# Patient Record
Sex: Female | Born: 1975 | Race: Black or African American | Hispanic: No | Marital: Single | Smoking: Current some day smoker
Health system: Southern US, Community
[De-identification: ages and names within clinical notes are randomized; demographics above are authoritative.]

## PROBLEM LIST (undated history)

## (undated) DIAGNOSIS — N049 Nephrotic syndrome with unspecified morphologic changes: Secondary | ICD-10-CM

## (undated) DIAGNOSIS — N289 Disorder of kidney and ureter, unspecified: Secondary | ICD-10-CM

## (undated) DIAGNOSIS — D649 Anemia, unspecified: Secondary | ICD-10-CM

## (undated) DIAGNOSIS — I10 Essential (primary) hypertension: Secondary | ICD-10-CM

## (undated) HISTORY — PX: RENAL BIOPSY: SHX156

## (undated) HISTORY — DX: Anemia, unspecified: D64.9

---

## 2015-10-14 ENCOUNTER — Encounter (HOSPITAL_COMMUNITY): Payer: Self-pay | Admitting: *Deleted

## 2015-10-14 ENCOUNTER — Inpatient Hospital Stay (HOSPITAL_COMMUNITY)
Admission: EM | Admit: 2015-10-14 | Discharge: 2015-10-25 | DRG: 683 | Disposition: A | Discharge status: Home / Self Care | Payer: Medicaid - Out of State | Attending: Internal Medicine | Admitting: Internal Medicine

## 2015-10-14 ENCOUNTER — Emergency Department (HOSPITAL_COMMUNITY): Payer: Medicaid - Out of State

## 2015-10-14 DIAGNOSIS — R609 Edema, unspecified: Secondary | ICD-10-CM | POA: Diagnosis present

## 2015-10-14 DIAGNOSIS — N051 Unspecified nephritic syndrome with focal and segmental glomerular lesions: Secondary | ICD-10-CM

## 2015-10-14 DIAGNOSIS — I129 Hypertensive chronic kidney disease with stage 1 through stage 4 chronic kidney disease, or unspecified chronic kidney disease: Principal | ICD-10-CM | POA: Diagnosis present

## 2015-10-14 DIAGNOSIS — E559 Vitamin D deficiency, unspecified: Secondary | ICD-10-CM | POA: Diagnosis present

## 2015-10-14 DIAGNOSIS — I10 Essential (primary) hypertension: Secondary | ICD-10-CM

## 2015-10-14 DIAGNOSIS — E876 Hypokalemia: Secondary | ICD-10-CM | POA: Diagnosis present

## 2015-10-14 DIAGNOSIS — D638 Anemia in other chronic diseases classified elsewhere: Secondary | ICD-10-CM | POA: Diagnosis present

## 2015-10-14 DIAGNOSIS — M722 Plantar fascial fibromatosis: Secondary | ICD-10-CM | POA: Diagnosis not present

## 2015-10-14 DIAGNOSIS — Z7952 Long term (current) use of systemic steroids: Secondary | ICD-10-CM

## 2015-10-14 DIAGNOSIS — N184 Chronic kidney disease, stage 4 (severe): Secondary | ICD-10-CM | POA: Diagnosis not present

## 2015-10-14 DIAGNOSIS — Z888 Allergy status to other drugs, medicaments and biological substances status: Secondary | ICD-10-CM

## 2015-10-14 DIAGNOSIS — N049 Nephrotic syndrome with unspecified morphologic changes: Secondary | ICD-10-CM

## 2015-10-14 DIAGNOSIS — M109 Gout, unspecified: Secondary | ICD-10-CM | POA: Diagnosis present

## 2015-10-14 DIAGNOSIS — F17200 Nicotine dependence, unspecified, uncomplicated: Secondary | ICD-10-CM | POA: Diagnosis present

## 2015-10-14 DIAGNOSIS — E1122 Type 2 diabetes mellitus with diabetic chronic kidney disease: Secondary | ICD-10-CM | POA: Diagnosis present

## 2015-10-14 DIAGNOSIS — M2550 Pain in unspecified joint: Secondary | ICD-10-CM | POA: Diagnosis present

## 2015-10-14 DIAGNOSIS — E872 Acidosis: Secondary | ICD-10-CM | POA: Diagnosis present

## 2015-10-14 DIAGNOSIS — Z79899 Other long term (current) drug therapy: Secondary | ICD-10-CM

## 2015-10-14 DIAGNOSIS — N189 Chronic kidney disease, unspecified: Secondary | ICD-10-CM | POA: Insufficient documentation

## 2015-10-14 DIAGNOSIS — I160 Hypertensive urgency: Secondary | ICD-10-CM | POA: Diagnosis present

## 2015-10-14 DIAGNOSIS — E8770 Fluid overload, unspecified: Secondary | ICD-10-CM | POA: Diagnosis present

## 2015-10-14 HISTORY — DX: Essential (primary) hypertension: I10

## 2015-10-14 HISTORY — DX: Disorder of kidney and ureter, unspecified: N28.9

## 2015-10-14 LAB — URINALYSIS, ROUTINE W REFLEX MICROSCOPIC
Bilirubin Urine: NEGATIVE
Glucose, UA: NEGATIVE mg/dL
Hgb urine dipstick: NEGATIVE
Ketones, ur: NEGATIVE mg/dL
LEUKOCYTES UA: NEGATIVE
NITRITE: NEGATIVE
SPECIFIC GRAVITY, URINE: 1.027 (ref 1.005–1.030)
pH: 5.5 (ref 5.0–8.0)

## 2015-10-14 LAB — BASIC METABOLIC PANEL
Anion gap: 10 (ref 5–15)
BUN: 70 mg/dL — AB (ref 6–20)
CHLORIDE: 118 mmol/L — AB (ref 101–111)
CO2: 16 mmol/L — AB (ref 22–32)
Calcium: 7.7 mg/dL — ABNORMAL LOW (ref 8.9–10.3)
Creatinine, Ser: 3.54 mg/dL — ABNORMAL HIGH (ref 0.44–1.00)
GFR calc Af Amer: 18 mL/min — ABNORMAL LOW (ref >60)
GFR, EST NON AFRICAN AMERICAN: 15 mL/min — AB (ref >60)
GLUCOSE: 90 mg/dL (ref 65–99)
POTASSIUM: 3.7 mmol/L (ref 3.5–5.1)
Sodium: 144 mmol/L (ref 135–145)

## 2015-10-14 LAB — BRAIN NATRIURETIC PEPTIDE: B Natriuretic Peptide: 201.7 pg/mL — ABNORMAL HIGH (ref 0.0–100.0)

## 2015-10-14 LAB — CBC WITH DIFFERENTIAL/PLATELET
Basophils Absolute: 0 10*3/uL (ref 0.0–0.1)
Basophils Relative: 0 %
EOS PCT: 1 %
Eosinophils Absolute: 0.1 10*3/uL (ref 0.0–0.7)
HEMATOCRIT: 31.1 % — AB (ref 36.0–46.0)
Hemoglobin: 9.9 g/dL — ABNORMAL LOW (ref 12.0–15.0)
LYMPHS ABS: 2.3 10*3/uL (ref 0.7–4.0)
LYMPHS PCT: 23 %
MCH: 29.5 pg (ref 26.0–34.0)
MCHC: 31.8 g/dL (ref 30.0–36.0)
MCV: 92.6 fL (ref 78.0–100.0)
Monocytes Absolute: 0.9 10*3/uL (ref 0.1–1.0)
Monocytes Relative: 9 %
Neutro Abs: 6.7 10*3/uL (ref 1.7–7.7)
Neutrophils Relative %: 67 %
PLATELETS: 361 10*3/uL (ref 150–400)
RBC: 3.36 MIL/uL — AB (ref 3.87–5.11)
RDW: 18.8 % — ABNORMAL HIGH (ref 11.5–15.5)
WBC: 10.1 10*3/uL (ref 4.0–10.5)

## 2015-10-14 LAB — HEPATIC FUNCTION PANEL
ALBUMIN: 1.7 g/dL — AB (ref 3.5–5.0)
ALT: 8 U/L — ABNORMAL LOW (ref 14–54)
AST: 14 U/L — AB (ref 15–41)
Alkaline Phosphatase: 79 U/L (ref 38–126)
Bilirubin, Direct: <0.1 mg/dL — ABNORMAL LOW (ref 0.1–0.5)
TOTAL PROTEIN: 5 g/dL — AB (ref 6.5–8.1)
Total Bilirubin: 0.7 mg/dL (ref 0.3–1.2)

## 2015-10-14 LAB — URINE MICROSCOPIC-ADD ON
RBC / HPF: NONE SEEN RBC/hpf (ref 0–5)
WBC UA: NONE SEEN WBC/hpf (ref 0–5)

## 2015-10-14 LAB — I-STAT BETA HCG BLOOD, ED (MC, WL, AP ONLY)

## 2015-10-14 LAB — I-STAT TROPONIN, ED: Troponin i, poc: 0.03 ng/mL (ref 0.00–0.08)

## 2015-10-14 MED ORDER — ALBUMIN HUMAN 25 % IV SOLN
25.0000 g | Freq: Three times a day (TID) | INTRAVENOUS | Status: DC
  Administered 2015-10-14 – 2015-10-16 (×7): 25 g via INTRAVENOUS
  Filled 2015-10-14 (×12): qty 100

## 2015-10-14 MED ORDER — ALBUMIN HUMAN 25 % IV SOLN
25.0000 g | Freq: Three times a day (TID) | INTRAVENOUS | Status: DC
  Filled 2015-10-14 (×2): qty 100

## 2015-10-14 MED ORDER — HYDRALAZINE HCL 50 MG PO TABS
50.0000 mg | ORAL_TABLET | Freq: Three times a day (TID) | ORAL | Status: DC
  Administered 2015-10-14 – 2015-10-17 (×8): 50 mg via ORAL
  Filled 2015-10-14 (×10): qty 1

## 2015-10-14 MED ORDER — ONDANSETRON HCL 4 MG/2ML IJ SOLN
4.0000 mg | Freq: Four times a day (QID) | INTRAMUSCULAR | Status: DC | PRN
  Administered 2015-10-15 – 2015-10-17 (×3): 4 mg via INTRAVENOUS
  Filled 2015-10-14 (×3): qty 2

## 2015-10-14 MED ORDER — ONDANSETRON HCL 4 MG PO TABS: 4.0000 mg | ORAL_TABLET | Freq: Four times a day (QID) | ORAL | Status: DC | PRN

## 2015-10-14 MED ORDER — FUROSEMIDE 10 MG/ML IJ SOLN
120.0000 mg | Freq: Three times a day (TID) | INTRAVENOUS | Status: DC
  Filled 2015-10-14 (×2): qty 12

## 2015-10-14 MED ORDER — PREDNISONE 10 MG PO TABS
10.0000 mg | ORAL_TABLET | Freq: Every day | ORAL | Status: DC
  Administered 2015-10-15 – 2015-10-16 (×2): 10 mg via ORAL
  Filled 2015-10-14 (×2): qty 1

## 2015-10-14 MED ORDER — HEPARIN SODIUM (PORCINE) 5000 UNIT/ML IJ SOLN
5000.0000 [IU] | Freq: Three times a day (TID) | INTRAMUSCULAR | Status: DC
  Administered 2015-10-14 – 2015-10-19 (×15): 5000 [IU] via SUBCUTANEOUS
  Filled 2015-10-14 (×15): qty 1

## 2015-10-14 MED ORDER — LABETALOL HCL 200 MG PO TABS
200.0000 mg | ORAL_TABLET | Freq: Two times a day (BID) | ORAL | Status: DC
  Administered 2015-10-14 – 2015-10-25 (×22): 200 mg via ORAL
  Filled 2015-10-14 (×23): qty 1

## 2015-10-14 MED ORDER — FUROSEMIDE 10 MG/ML IJ SOLN
120.0000 mg | Freq: Three times a day (TID) | INTRAVENOUS | Status: DC
  Administered 2015-10-14 – 2015-10-15 (×3): 120 mg via INTRAVENOUS
  Filled 2015-10-14 (×3): qty 12
  Filled 2015-10-14: qty 10
  Filled 2015-10-14 (×2): qty 12

## 2015-10-14 MED ORDER — SODIUM CHLORIDE 0.9 % IV SOLN: 250.0000 mL | INTRAVENOUS | Status: DC | PRN

## 2015-10-14 MED ORDER — SODIUM CHLORIDE 0.9 % IJ SOLN: 3.0000 mL | INTRAMUSCULAR | Status: DC | PRN

## 2015-10-14 MED ORDER — OXYCODONE HCL 5 MG PO TABS
5.0000 mg | ORAL_TABLET | ORAL | Status: DC | PRN
  Administered 2015-10-14 – 2015-10-21 (×21): 5 mg via ORAL
  Filled 2015-10-14 (×22): qty 1

## 2015-10-14 MED ORDER — SODIUM CHLORIDE 0.9 % IJ SOLN
3.0000 mL | Freq: Two times a day (BID) | INTRAMUSCULAR | Status: DC
  Administered 2015-10-14 – 2015-10-25 (×17): 3 mL via INTRAVENOUS

## 2015-10-14 MED ORDER — ACETAMINOPHEN 500 MG PO TABS
500.0000 mg | ORAL_TABLET | Freq: Four times a day (QID) | ORAL | Status: DC | PRN
  Administered 2015-10-15 – 2015-10-16 (×3): 500 mg via ORAL
  Filled 2015-10-14 (×3): qty 1

## 2015-10-14 MED ORDER — MORPHINE SULFATE (PF) 4 MG/ML IV SOLN
4.0000 mg | Freq: Once | INTRAVENOUS | Status: AC
  Administered 2015-10-14: 4 mg via INTRAVENOUS
  Filled 2015-10-14: qty 1

## 2015-10-14 NOTE — H&P (Signed)
PCP:   No primary care provider on file.   Chief Complaint:  Fatigue, worsening swelling  HPI: 40 year old female who has a history of FSGS diagnosed 2 years ago at [place], hypertension came to the hospital with worsening swelling throughout the body and fatigue. Patient recently moved to [place] from [place] 2 days ago she currently does not have a primary care physician. Patient is currently on Cytoxan treatment every month her last treatment was 3 weeks ago. Patient's last Cytoxan treatment is in February. Patient noticed that she has become more swollen than usual, causing shortness of breath on exertion. Though she denies chest pain no dizziness no palpitations. No nausea vomiting or diarrhea. No fever. No dysuria urgency frequency of urination. Though she does have increased frequency of bowel movement but denies diarrhea. Patient takes prednisone 10 g by mouth daily. Her blood pressure has been elevated over the past few days. She takes hydralazine 50 mg 3 times a day.  Allergies:   Allergies  Allergen Reactions  . Lisinopril Cough      Past Medical History  Diagnosis Date  . Renal disorder   . Hypertension     Past Surgical History  Procedure Laterality Date  . Cesarean section      Prior to Admission medications   Medication Sig Start Date End Date Taking? Authorizing Provider  acetaminophen (TYLENOL) 500 MG tablet Take 500 mg by mouth every 6 (six) hours as needed for moderate pain.   Yes Historical Provider, MD  hydrALAZINE (APRESOLINE) 50 MG tablet Take 50 mg by mouth 3 (three) times daily.   Yes Historical Provider, MD  predniSONE (DELTASONE) 10 MG tablet Take 10 mg by mouth daily with breakfast.   Yes Historical Provider, MD  torsemide (DEMADEX) 100 MG tablet Take 100 mg by mouth daily.   Yes Historical Provider, MD    Social History:  reports that she has been smoking.  She does not have any smokeless tobacco history on file. She reports that she  does not drink alcohol or use illicit drugs.  No family history of kidney disease  All the positives are listed in BOLD  Review of Systems:  HEENT: Headache, blurred vision, runny nose, sore throat Neck: Hypothyroidism, hyperthyroidism,,lymphadenopathy Chest : Shortness of breath, history of COPD, Asthma Heart : Chest pain, history of coronary arterey disease GI:  Nausea, vomiting, diarrhea, constipation, GERD GU: Dysuria, urgency, frequency of urination, hematuria Neuro: Stroke, seizures, syncope Psych: Depression, anxiety, hallucinations   Physical Exam: Blood pressure 176/118, pulse 74, temperature 98.2 F (36.8 C), temperature source Oral, resp. rate 16, SpO2 100 %. Constitutional:   Patient is a well-developed and well-nourished female* in no acute distress and cooperative with exam. Head: Normocephalic and atraumatic Mouth: Mucus membranes moist Eyes: PERRL, EOMI, left subconjunctival hemorrhage Neck: Supple, No Thyromegaly Cardiovascular: RRR, S1 normal, S2 normal Pulmonary/Chest: CTAB, no wheezes, rales, or rhonchi Abdominal: Soft. Non-tender, non-distended, bowel sounds are normal, no masses, organomegaly, or guarding present.  Neurological: A&O x3, Strength is normal and symmetric bilaterally, cranial nerve II-XII are grossly intact, no focal motor deficit, sensory intact to light touch bilaterally.  Extremities : 3+ pitting edema bilaterally lower extremities  Labs on Admission:  Basic Metabolic Panel:  Recent Labs Lab 10/14/15 1613  NA 144  K 3.7  CL 118*  CO2 16*  GLUCOSE 90  BUN 70*  CREATININE 3.54*  CALCIUM 7.7*   Liver Function Tests:  Recent Labs Lab 10/14/15 1604  AST 14*  ALT  8*  ALKPHOS 79  BILITOT 0.7  PROT 5.0*  ALBUMIN 1.7*   CBC:  Recent Labs Lab 10/14/15 1613  WBC 10.1  NEUTROABS 6.7  HGB 9.9*  HCT 31.1*  MCV 92.6  PLT 361    BNP (last 3 results)  Recent Labs  10/14/15 1614  BNP 201.7*     Radiological Exams on  Admission: Dg Chest 2 View  10/14/2015  CLINICAL DATA:  Nephrotic syndrome, fluid retention EXAM: CHEST  2 VIEW COMPARISON:  None. FINDINGS: The heart size and mediastinal contours are within normal limits. Both lungs are clear. The visualized skeletal structures are unremarkable. IMPRESSION: No active cardiopulmonary disease. Electronically Signed   By: Skipper Cliche M.D.   On: 10/14/2015 16:43    EKG: Independently reviewed. 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 Patient started on Lasix 120 mg IV every 8 hours. Torsemide unavailable in the hospital. We'll also start albumin 25 g 25% every 8 hours to help with diuresis. Called and discussed with nephrologist Dr. Jonnie Finner who will see the patient in a.m.  Hypertension urgency Patient blood pressure is not controlled with hydralazine Will continue with hydralazine 50 milligrams every 8 hours Start labetalol 200 mg every 12 hours  CKD stage IV Patient has FSGS, confirmed by biopsy in [place], [place]. Patient will need to follow up with nephrology as outpatient  DVT prophylaxis Heparin  Code status: Full code  Family discussion: No family present at bedside   Time Spent on Admission: 60 min  [facility] Pager: [phone] 10/14/2015, 7:27 PM  If 7PM-7AM, please contact night-coverage  [email or web address]  Password [email or web address]

## 2015-10-14 NOTE — ED Notes (Signed)
Per pt report: pt c/o of fluid retention all over her body.  Pt hx nephrolic syndrome and had cytoxin tx 3 weeks ago.  Pt reports SOB but is able to talk in complete sentences without difficulty and ambulate without difficulty. Pt reports pain in arms and legs.

## 2015-10-14 NOTE — ED Provider Notes (Signed)
CSN: [id number]     Arrival date & time 10/14/15  1316 History   First MD Initiated Contact with Patient 10/14/15 1538     Chief Complaint  Patient presents with  . Edema     (Consider location/radiation/quality/duration/timing/severity/associated sxs/prior Treatment) The history is provided by the patient and medical records.   40 y.o. F with hx of nephrotic syndrome, HTN, presenting to the ED for generalized edema. Patient states she has been having chronic issues with this over the past several months, however has become significantly worse over the past 2 weeks. Patient recently moved to [place] from [place] 2 days ago. She does not currently have a nephrologist nor a primary care physician. She did see her former nephrologist just before she moved and had Cytoxan treatment approximately 3 weeks ago. She states it is becoming very difficult for her to move around due to the swelling.  She also complains of some SOB.  Denies chest pain, palpitations, dizziness, or weakness.  Patient states her arms and legs have become increasingly painful.  She states her legs have also been "weeping" fluid.  She denies abdominal pain, nausea, vomiting, diarrhea.  No urinary symptoms.  States she has been taking her meds as normal, including her demadex and prednisone.  Past Medical History  Diagnosis Date  . Renal disorder   . Hypertension    Past Surgical History  Procedure Laterality Date  . Cesarean section     No family history on file. Social History  Substance Use Topics  . Smoking status: Current Some Day Smoker  . Smokeless tobacco: None  . Alcohol Use: No   OB History    No data available     Review of Systems  Constitutional:       Edema  All other systems reviewed and are negative.     Allergies  Lisinopril  Home Medications   Prior to Admission medications   Not on File   BP 185/122 mmHg  Pulse 85  Temp(Src) 98.2 F (36.8 C) (Oral)  Resp 18  SpO2 100%    Physical Exam  Constitutional: She is oriented to person, place, and time. She appears well-developed and well-nourished. No distress.  HENT:  Head: Normocephalic and atraumatic.  Mouth/Throat: Oropharynx is clear and moist.  Swelling/puffiness noted of face  Eyes: Conjunctivae and EOM are normal. Pupils are equal, round, and reactive to light.  Left subconjunctival hemorrhage noted, no eye pain, EOMs intact  Neck: Normal range of motion. Neck supple.  Cardiovascular: Normal rate, regular rhythm and normal heart sounds.   Pulmonary/Chest: Effort normal and breath sounds normal. No respiratory distress. She has no wheezes.  Abdominal: Soft. Bowel sounds are normal. There is no tenderness. There is no guarding.  Musculoskeletal: Normal range of motion.  3+ pitting edema of BLE extending up to thigh; no asymmetry or overlying skin changes, no weeping; no sores or open wounds  Neurological: She is alert and oriented to person, place, and time.  Skin: Skin is warm and dry. She is not diaphoretic.  Psychiatric: She has a normal mood and affect.  Nursing note and vitals reviewed.   ED Course  Procedures (including critical care time) Labs Review Labs Reviewed  CBC WITH DIFFERENTIAL/PLATELET - Abnormal; Notable for the following:    RBC 3.36 (*)    Hemoglobin 9.9 (*)    HCT 31.1 (*)    RDW 18.8 (*)    All other components within normal limits  BASIC METABOLIC PANEL - Abnormal;  Notable for the following:    Chloride 118 (*)    CO2 16 (*)    BUN 70 (*)    Creatinine, Ser 3.54 (*)    Calcium 7.7 (*)    GFR calc non Af Amer 15 (*)    GFR calc Af Amer 18 (*)    All other components within normal limits  BRAIN NATRIURETIC PEPTIDE - Abnormal; Notable for the following:    B Natriuretic Peptide 201.7 (*)    All other components within normal limits  URINALYSIS, ROUTINE W REFLEX MICROSCOPIC (NOT AT [facility]) - Abnormal; Notable for the following:    APPearance CLOUDY (*)    Protein, ur  >300 (*)    All other components within normal limits  HEPATIC FUNCTION PANEL - Abnormal; Notable for the following:    Total Protein 5.0 (*)    Albumin 1.7 (*)    AST 14 (*)    ALT 8 (*)    Bilirubin, Direct <0.1 (*)    All other components within normal limits  URINE MICROSCOPIC-ADD ON - Abnormal; Notable for the following:    Squamous Epithelial / LPF 6-30 (*)    Bacteria, UA FEW (*)    Casts HYALINE CASTS (*)    All other components within normal limits  I-STAT TROPOININ, ED  I-STAT BETA HCG BLOOD, ED ([facility], [facility], [facility] ONLY)    Imaging Review Dg Chest 2 View  10/14/2015  CLINICAL DATA:  Nephrotic syndrome, fluid retention EXAM: CHEST  2 VIEW COMPARISON:  None. FINDINGS: The heart size and mediastinal contours are within normal limits. Both lungs are clear. The visualized skeletal structures are unremarkable. IMPRESSION: No active cardiopulmonary disease. Electronically Signed   By: Skipper Cliche M.D.   On: 10/14/2015 16:43   I have personally reviewed and evaluated these images and lab results as part of my medical decision-making.   EKG Interpretation None      MDM   Final diagnoses:  Edema, unspecified type  Nephrotic syndrome   40 year old female here with generalized bodily fluid retention. His history of nephrotic syndrome, last had Cytoxan 3 weeks ago. She has recently relocated to [facility], does not currently have a primary care physician or a nephrologist in the area. States this is gotten worse over the past 2 weeks. Patient has swelling and puffiness noted to her face. She has 3+ pitting edema of bilateral lower extremities extending up to her thigh. There is no asymmetry or signs of cellulitis.  She reports SOB as well, denies chest pain.  No hx of CHF.  Currently on demadex 100mg  daily as well as prednisone 10mg  daily.  Will check labs, CXR.  Morphine given for pain.  5:37 PM Results discussed with patient-- SrCr today 3.54, GFR 15.  We have no records of her  prior kidney function for comparison of values, however she states her numbers were usually around 2.5 - 3.0.  Will discuss with nephrology for recommendations regarding treatment.  6:35 PM Case discussed with on call, Dr. Jonnie Finner-- recommends may be diuresed in the hospital.  Recommends 120mg  IV Lasix Q8H.  Monitor renal function.  Medicine may follow-up with him tomorrow if further recommendations needed.  Does not feel transfer to [place] necessary at this time.  Patient to follow-up with [facility] once discharged.  6:49 PM Patient admitted to medicine service, Dr. Darrick Meigs for diuresis.  Temp admit orders placed.  Larene Pickett, PA-C 10/14/15 [place], MD 10/14/15 2224

## 2015-10-15 DIAGNOSIS — N183 Chronic kidney disease, stage 3 (moderate): Secondary | ICD-10-CM | POA: Diagnosis not present

## 2015-10-15 DIAGNOSIS — I129 Hypertensive chronic kidney disease with stage 1 through stage 4 chronic kidney disease, or unspecified chronic kidney disease: Secondary | ICD-10-CM | POA: Diagnosis present

## 2015-10-15 DIAGNOSIS — F17200 Nicotine dependence, unspecified, uncomplicated: Secondary | ICD-10-CM | POA: Diagnosis present

## 2015-10-15 DIAGNOSIS — D638 Anemia in other chronic diseases classified elsewhere: Secondary | ICD-10-CM | POA: Diagnosis present

## 2015-10-15 DIAGNOSIS — E876 Hypokalemia: Secondary | ICD-10-CM | POA: Diagnosis present

## 2015-10-15 DIAGNOSIS — I10 Essential (primary) hypertension: Secondary | ICD-10-CM | POA: Diagnosis not present

## 2015-10-15 DIAGNOSIS — N179 Acute kidney failure, unspecified: Secondary | ICD-10-CM | POA: Diagnosis not present

## 2015-10-15 DIAGNOSIS — M109 Gout, unspecified: Secondary | ICD-10-CM | POA: Diagnosis present

## 2015-10-15 DIAGNOSIS — Z79899 Other long term (current) drug therapy: Secondary | ICD-10-CM | POA: Diagnosis not present

## 2015-10-15 DIAGNOSIS — N184 Chronic kidney disease, stage 4 (severe): Secondary | ICD-10-CM | POA: Diagnosis present

## 2015-10-15 DIAGNOSIS — Z7952 Long term (current) use of systemic steroids: Secondary | ICD-10-CM | POA: Diagnosis not present

## 2015-10-15 DIAGNOSIS — R5383 Other fatigue: Secondary | ICD-10-CM | POA: Diagnosis present

## 2015-10-15 DIAGNOSIS — I160 Hypertensive urgency: Secondary | ICD-10-CM | POA: Diagnosis present

## 2015-10-15 DIAGNOSIS — Z888 Allergy status to other drugs, medicaments and biological substances status: Secondary | ICD-10-CM | POA: Diagnosis not present

## 2015-10-15 DIAGNOSIS — M722 Plantar fascial fibromatosis: Secondary | ICD-10-CM | POA: Diagnosis present

## 2015-10-15 DIAGNOSIS — N049 Nephrotic syndrome with unspecified morphologic changes: Secondary | ICD-10-CM | POA: Diagnosis not present

## 2015-10-15 DIAGNOSIS — E872 Acidosis: Secondary | ICD-10-CM | POA: Diagnosis present

## 2015-10-15 DIAGNOSIS — E8770 Fluid overload, unspecified: Secondary | ICD-10-CM | POA: Diagnosis present

## 2015-10-15 DIAGNOSIS — E559 Vitamin D deficiency, unspecified: Secondary | ICD-10-CM | POA: Diagnosis present

## 2015-10-15 DIAGNOSIS — E1122 Type 2 diabetes mellitus with diabetic chronic kidney disease: Secondary | ICD-10-CM | POA: Diagnosis present

## 2015-10-15 DIAGNOSIS — M2550 Pain in unspecified joint: Secondary | ICD-10-CM | POA: Diagnosis present

## 2015-10-15 LAB — CBC
HEMATOCRIT: 26 % — AB (ref 36.0–46.0)
HEMOGLOBIN: 8.2 g/dL — AB (ref 12.0–15.0)
MCH: 29.5 pg (ref 26.0–34.0)
MCHC: 31.5 g/dL (ref 30.0–36.0)
MCV: 93.5 fL (ref 78.0–100.0)
Platelets: 237 10*3/uL (ref 150–400)
RBC: 2.78 MIL/uL — ABNORMAL LOW (ref 3.87–5.11)
RDW: 18.9 % — AB (ref 11.5–15.5)
WBC: 6.9 10*3/uL (ref 4.0–10.5)

## 2015-10-15 LAB — COMPREHENSIVE METABOLIC PANEL
ALBUMIN: 1.5 g/dL — AB (ref 3.5–5.0)
ALK PHOS: 77 U/L (ref 38–126)
ALT: 12 U/L — ABNORMAL LOW (ref 14–54)
ANION GAP: 9 (ref 5–15)
AST: 14 U/L — ABNORMAL LOW (ref 15–41)
BILIRUBIN TOTAL: 0.3 mg/dL (ref 0.3–1.2)
BUN: 74 mg/dL — ABNORMAL HIGH (ref 6–20)
CALCIUM: 7.6 mg/dL — AB (ref 8.9–10.3)
CO2: 17 mmol/L — ABNORMAL LOW (ref 22–32)
Chloride: 118 mmol/L — ABNORMAL HIGH (ref 101–111)
Creatinine, Ser: 3.51 mg/dL — ABNORMAL HIGH (ref 0.44–1.00)
GFR calc non Af Amer: 16 mL/min — ABNORMAL LOW (ref >60)
GFR, EST AFRICAN AMERICAN: 18 mL/min — AB (ref >60)
GLUCOSE: 86 mg/dL (ref 65–99)
POTASSIUM: 3.8 mmol/L (ref 3.5–5.1)
Sodium: 144 mmol/L (ref 135–145)
TOTAL PROTEIN: 4.2 g/dL — AB (ref 6.5–8.1)

## 2015-10-15 MED ORDER — DEXTROSE 5 % IV SOLN
18.5000 mL/h | INTRAVENOUS | Status: DC
  Administered 2015-10-15 – 2015-10-17 (×3): 18.5 mL/h via INTRAVENOUS
  Filled 2015-10-15 (×7): qty 250

## 2015-10-15 NOTE — Progress Notes (Signed)
[facility] PROGRESS NOTE  Sandy [name] [id number] DOB: [birth year] DOA: 10/14/2015 PCP: No primary care provider on file.  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 Patient was started on Lasix 120 mg IV every 8 hours.  Started patient on albumin 25 g 25% every 8 hours to help with diuresis. Dr. Jonnie Finner consulted for further recs  Hypertension urgency Blood pressures difficult to control with hydralazine Continued with hydralazine 50 milligrams every 8 hours Started on labetalol 200 mg every 12 hours BP seems improved  CKD stage IV Patient has FSGS, confirmed by biopsy in [place], [place]. Patient will need close Nephrology follow up as outpatient  DVT prophylaxis Heparin subq while admitted  Code Status: Full Family Communication: Pt in room Disposition Plan: Anticipate d/c when renal function improves and when cleared by Nephrology  Consultants:  nephrology  Procedures:    Antibiotics: Anti-infectives    None      HPI/Subjective: Denies sob or chest pain  Objective: Filed Vitals:   10/15/15 0640 10/15/15 0747 10/15/15 0815 10/15/15 1343  BP: 152/102 156/118 160/100 132/94  Pulse: 87 87 87 85  Temp: 98.2 F (36.8 C) 98 F (36.7 C) 98 F (36.7 C) 98.2 F (36.8 C)  TempSrc: Oral Oral Oral Oral  Resp: 16   16  Height:      Weight:      SpO2: 100% 100% 100% 99%    Intake/Output Summary (Last 24 hours) at 10/15/15 1407 Last data filed at 10/15/15 1225  Gross per 24 hour  Intake    564 ml  Output    500 ml  Net     64 ml   Filed Weights   10/14/15 1954  Weight: 113.172 kg (249 lb 8 oz)    Exam:   General:  Awake, in nad  Cardiovascular: regular, s1, s2  Respiratory: normal resp  effort, no wheezing  Abdomen: soft,nondistended  Musculoskeletal: perfused, 2-3+ pitting LE edema   Data Reviewed: Basic Metabolic Panel:  Recent Labs Lab 10/14/15 1613 10/15/15 0551  NA 144 144  K 3.7 3.8  CL 118* 118*  CO2 16* 17*  GLUCOSE 90 86  BUN 70* 74*  CREATININE 3.54* 3.51*  CALCIUM 7.7* 7.6*   Liver Function Tests:  Recent Labs Lab 10/14/15 1604 10/15/15 0551  AST 14* 14*  ALT 8* 12*  ALKPHOS 79 77  BILITOT 0.7 0.3  PROT 5.0* 4.2*  ALBUMIN 1.7* 1.5*   No results for input(s): LIPASE, AMYLASE in the last 168 hours. No results for input(s): AMMONIA in the last 168 hours. CBC:  Recent Labs Lab 10/14/15 1613 10/15/15 0551  WBC 10.1 6.9  NEUTROABS 6.7  --   HGB 9.9* 8.2*  HCT 31.1* 26.0*  MCV 92.6 93.5  PLT 361 237   Cardiac Enzymes: No results for input(s): CKTOTAL, CKMB, CKMBINDEX, TROPONINI in the last 168 hours. BNP (last 3 results)  Recent Labs  10/14/15 1614  BNP 201.7*    ProBNP (last 3 results) No results for input(s): PROBNP in the last 8760 hours.  CBG: No results for input(s): GLUCAP in the last 168 hours.  No results found for this or any previous visit (from the past 240 hour(s)).   Studies: Dg Chest 2 View  10/14/2015  CLINICAL DATA:  Nephrotic  syndrome, fluid retention EXAM: CHEST  2 VIEW COMPARISON:  None. FINDINGS: The heart size and mediastinal contours are within normal limits. Both lungs are clear. The visualized skeletal structures are unremarkable. IMPRESSION: No active cardiopulmonary disease. Electronically Signed   By: Skipper Cliche M.D.   On: 10/14/2015 16:43    Scheduled Meds: . albumin human  25 g Intravenous 3 times per day  . furosemide  120 mg Intravenous 3 times per day  . heparin  5,000 Units Subcutaneous 3 times per day  . hydrALAZINE  50 mg Oral TID  . labetalol  200 mg Oral BID  . predniSONE  10 mg Oral Q breakfast  . sodium chloride  3 mL Intravenous Q12H   Continuous Infusions:   Active  Problems:   Edema   Anasarca associated with disorder of kidney   CKD (chronic kidney disease), stage IV ([place])   Hypertension   Sandy [name], [place] [facility] Pager [phone]. If 7PM-7AM, please contact night-coverage at [email or web address], password [facility] 10/15/2015, 2:07 PM

## 2015-10-15 NOTE — Consult Note (Signed)
Renal Service Consult Note [facility]  Millenia Waldvogel 10/15/2015 Anza D Requesting Physician:  Dr Wyline Copas  Reason for Consult:  Patient w neph syndrome/ FSGS w leg swelling, severe edema HPI: The patient is a 40 y.o. year-old with hx of FSGS diagnosed by renal bx in [place], [place] July 2015.  Was treated with oral steroids w good response (history is all per patient), so after several months steroids were tapered and CyA was started.  She developed numerous side effects from CyA including renal dysfunction, hair growth, nausea.  She went to a new doctor at [facility] in [place] where she was put back on po steroids (pred 60/d) and started on IV cytoxan in June 2016, to get 6 courses.  She has rec'd 5 of the 6 monthly doses of Cytoxan.  Her prednisone has been tapered down to 10 mg about 1 month ago with the last dose 20 mg and 2 wks prior it was 30 mg , 2 wks prior 40 mg, etc.  She has developed severe edema of the legs and hips over the last 2 weeks. She just moved to [facility] to live with her sister and presented to ED yesterday due to the swelling. Her last Cytoxan dose is due in February.    She says she doesn't respond well to lasix.  She says her worse creat was 3.5 , but lately it has been better in the 2's.    She denies any nsaid use, drug use, SOB or CP. No cough, abd pain, dysuria, hematuria.  No joint pains.   Past Medical History  Past Medical History  Diagnosis Date  . Renal disorder   . Hypertension    Past Surgical History  Past Surgical History  Procedure Laterality Date  . Cesarean section     Family History No family history on file. Social History  reports that she has been smoking.  She does not have any smokeless tobacco history on file. She reports that she does not drink alcohol or use illicit drugs. Allergies  Allergies  Allergen Reactions  . Lisinopril Cough   Home medications Prior to Admission medications   Medication Sig Start Date End  Date Taking? Authorizing Provider  acetaminophen (TYLENOL) 500 MG tablet Take 500 mg by mouth every 6 (six) hours as needed for moderate pain.   Yes Historical Provider, MD  hydrALAZINE (APRESOLINE) 50 MG tablet Take 50 mg by mouth 3 (three) times daily.   Yes Historical Provider, MD  predniSONE (DELTASONE) 10 MG tablet Take 10 mg by mouth daily with breakfast.   Yes Historical Provider, MD  torsemide (DEMADEX) 100 MG tablet Take 100 mg by mouth daily.   Yes Historical Provider, MD   Liver Function Tests  Recent Labs Lab 10/14/15 1604 10/15/15 0551  AST 14* 14*  ALT 8* 12*  ALKPHOS 79 77  BILITOT 0.7 0.3  PROT 5.0* 4.2*  ALBUMIN 1.7* 1.5*   No results for input(s): LIPASE, AMYLASE in the last 168 hours. CBC  Recent Labs Lab 10/14/15 1613 10/15/15 0551  WBC 10.1 6.9  NEUTROABS 6.7  --   HGB 9.9* 8.2*  HCT 31.1* 26.0*  MCV 92.6 93.5  PLT 361 485   Basic Metabolic Panel  Recent Labs Lab 10/14/15 1613 10/15/15 0551  NA 144 144  K 3.7 3.8  CL 118* 118*  CO2 16* 17*  GLUCOSE 90 86  BUN 70* 74*  CREATININE 3.54* 3.51*  CALCIUM 7.7* 7.6*    Filed Vitals:  10/15/15 0815 10/15/15 1343 10/15/15 1428 10/15/15 1507  BP: 160/100 132/94 141/92 150/88  Pulse: 87 85 87 87  Temp: 98 F (36.7 C) 98.2 F (36.8 C) 98.2 F (36.8 C) 98.4 F (36.9 C)  TempSrc: Oral Oral Oral Oral  Resp:  16    Height:      Weight:      SpO2: 100% 99% 99% 100%   Exam Obese pleasant WF no distress No rash, cyanosis or gangrene Sclera anicteric, throat clear JVP 12 cm Chest occ crackles , mostly clear bilat RRR no mrg Abd obese soft ntnd striae no hsm no ascites GU deferred MS no joint effusion/ deformity Ext 3+ bilat LE edema from feet to hips Neuro is alert, Ox 3 appropriate  Na 144 K 3.8  CO2 17  BUN 74  Creat 3.51  CA 7.6 eGFR 16 ml/min  Alb 1.5   WBC 6.9  Hb 8.2  plt 237  Glu 86 CXR no acute UA >300 prot, no rbc/ wbc  Assessment: 1 Nephrotic syndrome - recurrent issue, hx  of FSGS on steroid taper and sp 5/6 IV monthly cytoxan doses. She says lasix doesn't work for diuresis.  2 HTN on hydralazine, labetalol added here 3 Renal failure, chronic +/- acute; stage 4 4 Anasarca - due to #1, no signs of heart failure   Plan - IV Bumex drip. IV albumin. Will follow.   Kelly Splinter MD [employer] pager [phone]    cell [phone] 10/15/2015, 7:53 PM

## 2015-10-16 LAB — CBC
HCT: 23.1 % — ABNORMAL LOW (ref 36.0–46.0)
HEMOGLOBIN: 7.4 g/dL — AB (ref 12.0–15.0)
MCH: 29.8 pg (ref 26.0–34.0)
MCHC: 32 g/dL (ref 30.0–36.0)
MCV: 93.1 fL (ref 78.0–100.0)
PLATELETS: 206 10*3/uL (ref 150–400)
RBC: 2.48 MIL/uL — AB (ref 3.87–5.11)
RDW: 18.8 % — ABNORMAL HIGH (ref 11.5–15.5)
WBC: 6.1 10*3/uL (ref 4.0–10.5)

## 2015-10-16 LAB — BASIC METABOLIC PANEL
Anion gap: 12 (ref 5–15)
BUN: 65 mg/dL — ABNORMAL HIGH (ref 6–20)
CALCIUM: 7.6 mg/dL — AB (ref 8.9–10.3)
CO2: 15 mmol/L — AB (ref 22–32)
CREATININE: 3.16 mg/dL — AB (ref 0.44–1.00)
Chloride: 114 mmol/L — ABNORMAL HIGH (ref 101–111)
GFR, EST AFRICAN AMERICAN: 20 mL/min — AB (ref >60)
GFR, EST NON AFRICAN AMERICAN: 18 mL/min — AB (ref >60)
Glucose, Bld: 87 mg/dL (ref 65–99)
Potassium: 3.4 mmol/L — ABNORMAL LOW (ref 3.5–5.1)
Sodium: 141 mmol/L (ref 135–145)

## 2015-10-16 LAB — PROTEIN / CREATININE RATIO, URINE
CREATININE, URINE: 64.82 mg/dL
Protein Creatinine Ratio: 21.04 mg/mg{Cre} — ABNORMAL HIGH (ref 0.00–0.15)
TOTAL PROTEIN, URINE: 1364 mg/dL

## 2015-10-16 MED ORDER — SODIUM BICARBONATE 650 MG PO TABS
650.0000 mg | ORAL_TABLET | Freq: Two times a day (BID) | ORAL | Status: DC
  Administered 2015-10-16 – 2015-10-25 (×18): 650 mg via ORAL
  Filled 2015-10-16 (×20): qty 1

## 2015-10-16 MED ORDER — PREDNISONE 50 MG PO TABS
60.0000 mg | ORAL_TABLET | Freq: Every day | ORAL | Status: DC
  Administered 2015-10-16: 50 mg via ORAL
  Administered 2015-10-17 – 2015-10-19 (×3): 60 mg via ORAL
  Filled 2015-10-16 (×4): qty 1

## 2015-10-16 NOTE — Care Management Note (Signed)
Case Management Note  Patient Details  Name: Sandy [name] MRN: [id number] Date of Birth: August 24, 1976  Subjective/Objective:    40 y/o f admitted w/Anasarca. Hx[place]                Action/Plan:d/c plan home.   Expected Discharge Date:                  Expected Discharge Plan:  Home/Self Care  In-House Referral:     Discharge planning Services  CM Consult  Post Acute Care Choice:    Choice offered to:     DME Arranged:    DME Agency:     HH Arranged:    HH Agency:     Status of Service:  In process, will continue to follow  Medicare Important Message Given:    Date Medicare IM Given:    Medicare IM give by:    Date Additional Medicare IM Given:    Additional Medicare Important Message give by:     If discussed at [place] Meetings, dates discussed:    Additional Comments:  Dessa Phi, RN 10/16/2015, 2:07 PM

## 2015-10-16 NOTE — Progress Notes (Signed)
  [facility] Progress Note   Subjective: having new pain in all the joints of her hands, feet, legs. Low grade temp y esterdya. No cough, SOB or chills.   Filed Vitals:   10/15/15 1428 10/15/15 1507 10/15/15 2100 10/16/15 0521  BP: 141/92 150/88 152/92 156/96  Pulse: 87 87 92 94  Temp: 98.2 F (36.8 C) 98.4 F (36.9 C) 99.7 F (37.6 C) 99 F (37.2 C)  TempSrc: Oral Oral Oral Oral  Resp:   16 18  Height:      Weight:      SpO2: 99% 100% 100% 100%    Inpatient medications: . albumin human  25 g Intravenous 3 times per day  . heparin  5,000 Units Subcutaneous 3 times per day  . hydrALAZINE  50 mg Oral TID  . labetalol  200 mg Oral BID  . predniSONE  60 mg Oral Q breakfast  . sodium bicarbonate  650 mg Oral BID  . sodium chloride  3 mL Intravenous Q12H   . dextrose 5 % 250 mL with bumetanide (BUMEX) 7.5 mg infusion 18.5 mL/hr (10/16/15 1147)   sodium chloride, acetaminophen, ondansetron **OR** ondansetron (ZOFRAN) IV, oxyCODONE, sodium chloride  Exam: Obese pleasant young adult WF no distress JVP 12 cm Chest occ crackles , mostly clear bilat RRR no mrg Abd obese soft ntnd striae no hsm no ascites MS no joint effusion/ deformity Ext 3+ bilat LE edema from feet to hips Neuro is alert, Ox 3 appropriate  Na 144 K 3.8 CO2 17 BUN 74 Creat 3.51 CA 7.6 eGFR 16 ml/min Alb 1.5 WBC 6.9 Hb 8.2 plt 237 Glu 86 CXR no acute UA >300 prot, no rbc/ wbc  Assessment: 1 Nephrotic syndrome - failed CyA in 2015 and now is failing her IV Cytoxan course (started in [place] 5-6 mos ago). Not a good prognosis. There may be some other Rx's that can be tried but with worsening renal function these are less and less likely to be successful.  I have d/w patient and let her know that there is not a good answer to this problem.  For now will raise pred back to 60/ d and focus on diuresis. Will defer any new immunotherapy to OP setting.  2 HTN on hydralazine, labetalol added  here 3 Renal failure, chronic +/- acute; stage 4 4 Met acidosis start po bicarb 5 Arthralgias - not sure what's causing this  Plan - as above   Kelly Splinter MD [facility] pager [phone]    cell [phone] 10/16/2015, 3:06 PM    Recent Labs Lab 10/14/15 1613 10/15/15 0551 10/16/15 0531  NA 144 144 141  K 3.7 3.8 3.4*  CL 118* 118* 114*  CO2 16* 17* 15*  GLUCOSE 90 86 87  BUN 70* 74* 65*  CREATININE 3.54* 3.51* 3.16*  CALCIUM 7.7* 7.6* 7.6*    Recent Labs Lab 10/14/15 1604 10/15/15 0551  AST 14* 14*  ALT 8* 12*  ALKPHOS 79 77  BILITOT 0.7 0.3  PROT 5.0* 4.2*  ALBUMIN 1.7* 1.5*    Recent Labs Lab 10/14/15 1613 10/15/15 0551 10/16/15 0531  WBC 10.1 6.9 6.1  NEUTROABS 6.7  --   --   HGB 9.9* 8.2* 7.4*  HCT 31.1* 26.0* 23.1*  MCV 92.6 93.5 93.1  PLT 361 237 206

## 2015-10-16 NOTE — Progress Notes (Signed)
[facility] PROGRESS NOTE  Alundra Petrecca [id number] DOB: 1975/10/04 DOA: 10/14/2015 PCP: No primary care provider on file.  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 Patient was initially started on Lasix 120 mg IV every 8 hours, now on bumex gtt Started patient on albumin 25 g 25% every 8 hours to help with diuresis. Dr. Jonnie Finner consulted and following. 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 Patient will eventually need close Nephrology follow up as outpatient  DVT prophylaxis Heparin subq while admitted  Code Status: Full Family Communication: Pt in room Disposition Plan: Anticipate d/c when renal function improves and when cleared by Nephrology  Consultants:  nephrology  Procedures:    Antibiotics: Anti-infectives    None      HPI/Subjective: Reports still having decreased urine output despite bumex gtt  Objective: Filed Vitals:   10/15/15 1428 10/15/15 1507 10/15/15 2100 10/16/15 0521  BP: 141/92 150/88 152/92 156/96  Pulse: 87 87 92 94  Temp: 98.2 F (36.8 C) 98.4 F (36.9 C) 99.7 F (37.6 C) 99 F (37.2 C)  TempSrc: Oral Oral Oral Oral  Resp:   16 18  Height:      Weight:      SpO2: 99% 100% 100% 100%    Intake/Output Summary (Last 24 hours) at 10/16/15 1329 Last data filed at 10/16/15 0820  Gross per 24 hour  Intake 1244.8 ml  Output   2100 ml  Net -855.2 ml   Filed Weights   10/14/15 1954  Weight: 113.172 kg (249  lb 8 oz)    Exam:   General:  Awake, in nad  Cardiovascular: regular, s1, s2  Respiratory: normal resp effort, no wheezing  Abdomen: soft,nondistended  Musculoskeletal: perfused, 2+ pitting LE edema bilaterally  Data Reviewed: Basic Metabolic Panel:  Recent Labs Lab 10/14/15 1613 10/15/15 0551 10/16/15 0531  NA 144 144 141  K 3.7 3.8 3.4*  CL 118* 118* 114*  CO2 16* 17* 15*  GLUCOSE 90 86 87  BUN 70* 74* 65*  CREATININE 3.54* 3.51* 3.16*  CALCIUM 7.7* 7.6* 7.6*   Liver Function Tests:  Recent Labs Lab 10/14/15 1604 10/15/15 0551  AST 14* 14*  ALT 8* 12*  ALKPHOS 79 77  BILITOT 0.7 0.3  PROT 5.0* 4.2*  ALBUMIN 1.7* 1.5*   No results for input(s): LIPASE, AMYLASE in the last 168 hours. No results for input(s): AMMONIA in the last 168 hours. CBC:  Recent Labs Lab 10/14/15 1613 10/15/15 0551 10/16/15 0531  WBC 10.1 6.9 6.1  NEUTROABS 6.7  --   --   HGB 9.9* 8.2* 7.4*  HCT 31.1* 26.0* 23.1*  MCV 92.6 93.5 93.1  PLT 361 237 206   Cardiac Enzymes: No results for input(s): CKTOTAL, CKMB, CKMBINDEX, TROPONINI in the last 168 hours. BNP (last 3 results)  Recent Labs  10/14/15 1614  BNP 201.7*    ProBNP (last 3 results) No results for input(s): PROBNP in the last 8760 hours.  CBG: No results for input(s): GLUCAP in the last 168 hours.  No results  found for this or any previous visit (from the past 240 hour(s)).   Studies: Dg Chest 2 View  10/14/2015  CLINICAL DATA:  Nephrotic syndrome, fluid retention EXAM: CHEST  2 VIEW COMPARISON:  None. FINDINGS: The heart size and mediastinal contours are within normal limits. Both lungs are clear. The visualized skeletal structures are unremarkable. IMPRESSION: No active cardiopulmonary disease. Electronically Signed   By: Skipper Cliche M.D.   On: 10/14/2015 16:43    Scheduled Meds: . albumin human  25 g Intravenous 3 times per day  . heparin  5,000 Units Subcutaneous 3 times per day  . hydrALAZINE   50 mg Oral TID  . labetalol  200 mg Oral BID  . predniSONE  10 mg Oral Q breakfast  . sodium chloride  3 mL Intravenous Q12H   Continuous Infusions: . dextrose 5 % 250 mL with bumetanide (BUMEX) 7.5 mg infusion 18.5 mL/hr (10/16/15 1147)    Active Problems:   Edema   Anasarca associated with disorder of kidney   CKD (chronic kidney disease), stage IV ([place])   Hypertension   [facility], [facility] Pager [phone]. If 7PM-7AM, please contact night-coverage at [email or web address], password [facility] 10/16/2015, 1:29 PM  LOS: 1 day

## 2015-10-17 LAB — BASIC METABOLIC PANEL
Anion gap: 9 (ref 5–15)
BUN: 57 mg/dL — AB (ref 6–20)
CHLORIDE: 114 mmol/L — AB (ref 101–111)
CO2: 20 mmol/L — AB (ref 22–32)
CREATININE: 2.97 mg/dL — AB (ref 0.44–1.00)
Calcium: 8 mg/dL — ABNORMAL LOW (ref 8.9–10.3)
GFR calc Af Amer: 22 mL/min — ABNORMAL LOW (ref >60)
GFR calc non Af Amer: 19 mL/min — ABNORMAL LOW (ref >60)
Glucose, Bld: 111 mg/dL — ABNORMAL HIGH (ref 65–99)
POTASSIUM: 3.4 mmol/L — AB (ref 3.5–5.1)
Sodium: 143 mmol/L (ref 135–145)

## 2015-10-17 LAB — VITAMIN D 25 HYDROXY (VIT D DEFICIENCY, FRACTURES)

## 2015-10-17 MED ORDER — HYDRALAZINE HCL 50 MG PO TABS
75.0000 mg | ORAL_TABLET | Freq: Three times a day (TID) | ORAL | Status: DC
  Filled 2015-10-17 (×2): qty 1

## 2015-10-17 MED ORDER — TORSEMIDE 100 MG PO TABS
100.0000 mg | ORAL_TABLET | Freq: Two times a day (BID) | ORAL | Status: DC
  Administered 2015-10-17 – 2015-10-22 (×11): 100 mg via ORAL
  Filled 2015-10-17 (×12): qty 1

## 2015-10-17 MED ORDER — HYDRALAZINE HCL 50 MG PO TABS
50.0000 mg | ORAL_TABLET | Freq: Three times a day (TID) | ORAL | Status: DC
  Administered 2015-10-17 – 2015-10-21 (×11): 50 mg via ORAL
  Filled 2015-10-17 (×12): qty 1

## 2015-10-17 MED ORDER — FUROSEMIDE 10 MG/ML IJ SOLN: 160.0000 mg | Freq: Four times a day (QID) | INTRAVENOUS | Status: DC

## 2015-10-17 MED ORDER — LOSARTAN POTASSIUM 50 MG PO TABS
50.0000 mg | ORAL_TABLET | Freq: Every day | ORAL | Status: DC
  Administered 2015-10-17 – 2015-10-19 (×3): 50 mg via ORAL
  Filled 2015-10-17 (×3): qty 1

## 2015-10-17 MED ORDER — METOLAZONE 5 MG PO TABS
5.0000 mg | ORAL_TABLET | Freq: Two times a day (BID) | ORAL | Status: DC
  Administered 2015-10-17 – 2015-10-19 (×4): 5 mg via ORAL
  Filled 2015-10-17 (×5): qty 1

## 2015-10-17 NOTE — Progress Notes (Signed)
  [facility] Progress Note   Subjective: 2.3 L off yesterday  Filed Vitals:   10/16/15 2215 10/16/15 2345 10/17/15 0432 10/17/15 1418  BP: 169/117 176/104 157/104 169/102  Pulse:   84 78  Temp:   97.8 F (36.6 C) 98.2 F (36.8 C)  TempSrc:   Oral Oral  Resp:   18 16  Height:      Weight:   114.397 kg (252 lb 3.2 oz)   SpO2:   100% 99%    Inpatient medications: . heparin  5,000 Units Subcutaneous 3 times per day  . hydrALAZINE  50 mg Oral TID  . labetalol  200 mg Oral BID  . losartan  50 mg Oral Daily  . metolazone  5 mg Oral BID  . predniSONE  60 mg Oral Q breakfast  . sodium bicarbonate  650 mg Oral BID  . sodium chloride  3 mL Intravenous Q12H  . torsemide  100 mg Oral BID     sodium chloride, acetaminophen, ondansetron **OR** ondansetron (ZOFRAN) IV, oxyCODONE, sodium chloride  Exam: Obese pleasant young adult WF no distress JVP 12 cm Chest occ crackles , mostly clear bilat RRR no mrg Abd obese soft ntnd striae no hsm no ascites MS no joint effusion/ deformity Ext 3+ bilat LE edema from feet to hips Neuro is alert, Ox 3 appropriate  Na 144 K 3.8 CO2 17 BUN 74 Creat 3.51 CA 7.6 eGFR 16 ml/min Alb 1.5 WBC 6.9 Hb 8.2 plt 237 Glu 86 CXR no acute UA >300 prot, no rbc/ wbc  Assessment: 1 Nephrotic syndrome - failed CyA in 2015 and then had Cellcept. Was getting course of monthly IV Cytoxan and "doing very good" with that according to patient but missed one dose due to gout flare and then went into relapse with 60 lb wt gain over last few weeks. 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  2 HTN on hydralazine, labetalol added here 3 Renal failure, chronic +/- acute; stage 4 4 Met acidosis start po bicarb 5 Arthralgias - not sure what's causing this  Plan - marginal diuresis w IV bumex, will switch to high-dose po torsemide + zaroxlyn which has worked for  her in the past.  Adding losartan for proteinuria reduction. She had cough reaction to ACEi in the past. Will consider renal biopsy after diuresis accomplished to establish new baseline. Will try to contact her MD in [place] regarding the Cytoxan course.    Kelly Splinter MD [facility] pager [phone]    cell [phone] 10/17/2015, 5:11 PM    Recent Labs Lab 10/15/15 0551 10/16/15 0531 10/17/15 0508  NA 144 141 143  K 3.8 3.4* 3.4*  CL 118* 114* 114*  CO2 17* 15* 20*  GLUCOSE 86 87 111*  BUN 74* 65* 57*  CREATININE 3.51* 3.16* 2.97*  CALCIUM 7.6* 7.6* 8.0*    Recent Labs Lab 10/14/15 1604 10/15/15 0551  AST 14* 14*  ALT 8* 12*  ALKPHOS 79 77  BILITOT 0.7 0.3  PROT 5.0* 4.2*  ALBUMIN 1.7* 1.5*    Recent Labs Lab 10/14/15 1613 10/15/15 0551 10/16/15 0531  WBC 10.1 6.9 6.1  NEUTROABS 6.7  --   --   HGB 9.9* 8.2* 7.4*  HCT 31.1* 26.0* 23.1*  MCV 92.6 93.5 93.1  PLT 361 237 206

## 2015-10-17 NOTE — Progress Notes (Signed)
Patient refused 0600 dose of Albumin. Patient stated "I feel like the extra fluid is making me gain weight". Patient also stated "Ever since I was started on albumin I've been nauseous and vommitting after meals." As of this morning patient's weight is up 3 lbs since admission. Will continue to monitor strict I/O's

## 2015-10-17 NOTE — Progress Notes (Signed)
[facility] PROGRESS NOTE  Sandy [name] [id number] DOB: July 08, 1976 DOA: 10/14/2015 PCP: No primary care provider on file.  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 Patient was initially started on Lasix 120 mg IV every 8 hours, now on bumex gtt Pt was started on albumin 25 g 25% every 8 hours to help with diuresis. Dr. Jonnie [name] consulted and continues to follow. Appreciate input Overnight 2.3L urine output Pt refusing albumin this AM  Hypertension urgency Blood pressures were initially difficult to control with hydralazine Continued with hydralazine 50 milligrams every 8 hours Started on labetalol 200 mg every 12 hours BP still suboptimal. 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 Patient will eventually need close Nephrology follow up as outpatient  DVT prophylaxis Heparin subq while admitted  Code Status: Full Family Communication: Pt in room Disposition Plan: Anticipate d/c when renal function improves and when cleared by Nephrology  Consultants:  nephrology  Procedures:    Antibiotics: Anti-infectives    None      HPI/Subjective: Complaining of weight and nausea, pt attributes this to albumin  Objective: Filed Vitals:   10/16/15 2215 10/16/15 2345 10/17/15 0432 10/17/15 1418  BP: 169/117 176/104 157/104 169/102  Pulse:   84 78  Temp:   97.8 F (36.6 C) 98.2 F (36.8 C)  TempSrc:   Oral Oral  Resp:   18 16  Height:      Weight:   114.397 kg (252 lb 3.2 oz)   SpO2:   100% 99%    Intake/Output Summary (Last 24 hours) at 10/17/15 1431 Last data filed at 10/17/15 1300  Gross per 24 hour  Intake   1564 ml  Output   2175 ml   Net   -611 ml   Filed Weights   10/14/15 1954 10/17/15 0432  Weight: 113.172 kg (249 lb 8 oz) 114.397 kg (252 lb 3.2 oz)    Exam:   General:  Awake, in nad, laying in bed  Cardiovascular: regular, s1, s2  Respiratory: normal resp effort, no wheezing  Abdomen: soft,nondistended, pos BS  Musculoskeletal: perfused, 2+ pitting LE edema bilaterally  Data Reviewed: Basic Metabolic Panel:  Recent Labs Lab 10/14/15 1613 10/15/15 0551 10/16/15 0531 10/17/15 0508  NA 144 144 141 143  K 3.7 3.8 3.4* 3.4*  CL 118* 118* 114* 114*  CO2 16* 17* 15* 20*  GLUCOSE 90 86 87 111*  BUN 70* 74* 65* 57*  CREATININE 3.54* 3.51* 3.16* 2.97*  CALCIUM 7.7* 7.6* 7.6* 8.0*   Liver Function Tests:  Recent Labs Lab 10/14/15 1604 10/15/15 0551  AST 14* 14*  ALT 8* 12*  ALKPHOS 79 77  BILITOT 0.7 0.3  PROT 5.0* 4.2*  ALBUMIN 1.7* 1.5*   No results for input(s): LIPASE, AMYLASE in the last 168 hours. No results for input(s): AMMONIA in the last 168 hours. CBC:  Recent Labs Lab 10/14/15 1613 10/15/15 0551 10/16/15 0531  WBC 10.1 6.9 6.1  NEUTROABS 6.7  --   --   HGB 9.9* 8.2* 7.4*  HCT 31.1* 26.0* 23.1*  MCV 92.6 93.5 93.1  PLT 361 237 206   Cardiac Enzymes: No results for input(s): CKTOTAL, CKMB, CKMBINDEX, TROPONINI in the last 168 hours. BNP (last 3  results)  Recent Labs  10/14/15 1614  BNP 201.7*    ProBNP (last 3 results) No results for input(s): PROBNP in the last 8760 hours.  CBG: No results for input(s): GLUCAP in the last 168 hours.  No results found for this or any previous visit (from the past 240 hour(s)).   Studies: No results found.  Scheduled Meds: . heparin  5,000 Units Subcutaneous 3 times per day  . hydrALAZINE  50 mg Oral TID  . labetalol  200 mg Oral BID  . predniSONE  60 mg Oral Q breakfast  . sodium bicarbonate  650 mg Oral BID  . sodium chloride  3 mL Intravenous Q12H   Continuous Infusions: . dextrose 5 % 250 mL with bumetanide  (BUMEX) 7.5 mg infusion 18.5 mL/hr (10/17/15 0500)    Active Problems:   Edema   Anasarca associated with disorder of kidney   CKD (chronic kidney disease), stage IV ([place])   Hypertension   Sandy [name], [facility] Pager [phone]. If 7PM-7AM, please contact night-coverage at [email or web address], password [facility] 10/17/2015, 2:31 PM  LOS: 2 days

## 2015-10-18 LAB — BASIC METABOLIC PANEL
Anion gap: 11 (ref 5–15)
BUN: 63 mg/dL — AB (ref 6–20)
CALCIUM: 7.9 mg/dL — AB (ref 8.9–10.3)
CO2: 19 mmol/L — AB (ref 22–32)
Chloride: 111 mmol/L (ref 101–111)
Creatinine, Ser: 3.37 mg/dL — ABNORMAL HIGH (ref 0.44–1.00)
GFR calc Af Amer: 19 mL/min — ABNORMAL LOW (ref >60)
GFR, EST NON AFRICAN AMERICAN: 16 mL/min — AB (ref >60)
GLUCOSE: 113 mg/dL — AB (ref 65–99)
Potassium: 3.7 mmol/L (ref 3.5–5.1)
Sodium: 141 mmol/L (ref 135–145)

## 2015-10-18 LAB — VITAMIN C: Vitamin C: 1.1 mg/dL (ref 0.2–2.0)

## 2015-10-18 MED ORDER — HYDRALAZINE HCL 20 MG/ML IJ SOLN
5.0000 mg | Freq: Once | INTRAMUSCULAR | Status: AC
  Administered 2015-10-18: 5 mg via INTRAVENOUS
  Filled 2015-10-18: qty 1

## 2015-10-18 NOTE — Care Management Note (Signed)
Case Management Note  Patient Details  Name: Sandy [name] MRN: [id number] Date of Birth: 11-Jul-1976  Subjective/Objective:  Patient lives in [place] now, has a safe home, Provided her w/pcp listing-encouraged her to choose pcp, & set up appt prior to d/c,provided w/$4 [facility] med list, informed to go to DSS to f/u on medicaid of [place].Patient voiced understanding.                  Action/Plan:d/c plan home.   Expected Discharge Date:                  Expected Discharge Plan:  Home/Self Care  In-House Referral:     Discharge planning Services  CM Consult  Post Acute Care Choice:    Choice offered to:     DME Arranged:    DME Agency:     HH Arranged:    HH Agency:     Status of Service:  In process, will continue to follow  Medicare Important Message Given:    Date Medicare IM Given:    Medicare IM give by:    Date Additional Medicare IM Given:    Additional Medicare Important Message give by:     If discussed at [place] of Stay Meetings, dates discussed:    Additional Comments:  Dessa Phi, RN 10/18/2015, 2:27 PM

## 2015-10-18 NOTE — Progress Notes (Signed)
PT Cancellation Note  Patient Details Name: Sandy [name] MRN: [id number] DOB: 09-Nov-1975   Cancelled Treatment:    Reason Eval/Treat Not Completed: PT screened, no needs identified, will sign off. Spoke with pt who denies need for PT services. Will sign off.    Weston Anna, MPT Pager: [phone]

## 2015-10-18 NOTE — Progress Notes (Signed)
OT Cancellation Note  Patient Details Name: Sandy [name] MRN: [id number] DOB: Aug 23, 1976   Cancelled Treatment:    Reason Eval/Treat Not Completed: OT screened, no needs identified, will sign off -- Spoke to patient who stated she has no OT needs. Reports she has been getting up, toileting, grooming, etc. Without assistance in her room. Declined OT evaluation. OT will sign off.  Ocie Tino A 10/18/2015, 11:03 AM

## 2015-10-18 NOTE — Progress Notes (Signed)
[facility] PROGRESS NOTE  Sandy [name] [id number] DOB: [birth year] DOA: 10/14/2015 PCP: No primary care provider on file.  HPI/Brief narrative 40 year old female who has a history of FSGS diagnosed 2 years ago at [place], hypertension came to the hospital with worsening swelling throughout the body and fatigue  Assessment/Plan: Nephrotic Syndrome/CKD 4 -Patient has FSGS, confirmed by biopsy in [place], [place]. -was on IV lasix and then on bumex gtt -now on Torsemide and metolazone per Renal -urine output improving, 2.5L negative -continue this regimen, Renal to decide regarding biopsy -needs close Renal FU at DC  Hypertension urgency -improving, continue hydralazine, labetalol  DVT prophylaxis Heparin subq while admitted  Code Status: Full Family Communication: Pt in room Disposition Plan: 1-2days  Consultants:  nephrology  Procedures:    Antibiotics: Anti-infectives    None      HPI/Subjective: Starting to feel better, swelling improving  Objective: Filed Vitals:   10/17/15 1418 10/17/15 2204 10/18/15 0505 10/18/15 0648  BP: 169/102 178/105 174/100 137/89  Pulse: 78 76 74   Temp: 98.2 F (36.8 C) 98 F (36.7 C) 97.5 F (36.4 C)   TempSrc: Oral Oral Oral   Resp: 16 18 16    Height:      Weight:   112.991 kg (249 lb 1.6 oz)   SpO2: 99% 99% 100%     Intake/Output Summary (Last 24 hours) at 10/18/15 1329 Last data filed at 10/18/15 0945  Gross per 24 hour  Intake    120 ml  Output   2000 ml  Net  -1880 ml   Filed Weights   10/14/15 1954 10/17/15 0432 10/18/15 0505  Weight: 113.172 kg (249 lb 8 oz) 114.397 kg (252 lb 3.2 oz) 112.991 kg (249 lb 1.6 oz)    Exam:   General:  AAOx3, no distress  Cardiovascular: regular, s1, s2  Respiratory: normal resp effort, no wheezing  Abdomen: soft,nondistended, pos BS  Musculoskeletal: 2+ pitting LE edema bilaterally  Data Reviewed: Basic Metabolic Panel:  Recent Labs Lab  10/14/15 1613 10/15/15 0551 10/16/15 0531 10/17/15 0508 10/18/15 0500  NA 144 144 141 143 141  K 3.7 3.8 3.4* 3.4* 3.7  CL 118* 118* 114* 114* 111  CO2 16* 17* 15* 20* 19*  GLUCOSE 90 86 87 111* 113*  BUN 70* 74* 65* 57* 63*  CREATININE 3.54* 3.51* 3.16* 2.97* 3.37*  CALCIUM 7.7* 7.6* 7.6* 8.0* 7.9*   Liver Function Tests:  Recent Labs Lab 10/14/15 1604 10/15/15 0551  AST 14* 14*  ALT 8* 12*  ALKPHOS 79 77  BILITOT 0.7 0.3  PROT 5.0* 4.2*  ALBUMIN 1.7* 1.5*   No results for input(s): LIPASE, AMYLASE in the last 168 hours. No results for input(s): AMMONIA in the last 168 hours. CBC:  Recent Labs Lab 10/14/15 1613 10/15/15 0551 10/16/15 0531  WBC 10.1 6.9 6.1  NEUTROABS 6.7  --   --   HGB 9.9* 8.2* 7.4*  HCT 31.1* 26.0* 23.1*  MCV 92.6 93.5 93.1  PLT 361 237 206   Cardiac Enzymes: No results for input(s): CKTOTAL, CKMB, CKMBINDEX, TROPONINI in the last 168 hours. BNP (last 3 results)  Recent Labs  10/14/15 1614  BNP 201.7*    ProBNP (last 3 results) No results for input(s): PROBNP in the last 8760 hours.  CBG: No results for input(s): GLUCAP in the last 168 hours.  No results found for this or any previous visit (from the past 240 hour(s)).   Studies: No results found.  Scheduled  Meds: . heparin  5,000 Units Subcutaneous 3 times per day  . hydrALAZINE  50 mg Oral TID  . labetalol  200 mg Oral BID  . losartan  50 mg Oral Daily  . metolazone  5 mg Oral BID  . predniSONE  60 mg Oral Q breakfast  . sodium bicarbonate  650 mg Oral BID  . sodium chloride  3 mL Intravenous Q12H  . torsemide  100 mg Oral BID   Continuous Infusions:    Active Problems:   Edema   Anasarca associated with disorder of kidney   CKD (chronic kidney disease), stage IV ([place])   Hypertension   [place] [facility] Pager [phone]. If 7PM-7AM, please contact night-coverage at [email or web address], password [facility] 10/18/2015, 1:29 PM  LOS: 3 days

## 2015-10-19 ENCOUNTER — Encounter (HOSPITAL_COMMUNITY): Payer: Self-pay | Admitting: General Surgery

## 2015-10-19 LAB — FERRITIN: FERRITIN: 84 ng/mL (ref 11–307)

## 2015-10-19 LAB — BASIC METABOLIC PANEL
ANION GAP: 10 (ref 5–15)
BUN: 72 mg/dL — AB (ref 6–20)
CHLORIDE: 112 mmol/L — AB (ref 101–111)
CO2: 19 mmol/L — ABNORMAL LOW (ref 22–32)
Calcium: 7.8 mg/dL — ABNORMAL LOW (ref 8.9–10.3)
Creatinine, Ser: 3.43 mg/dL — ABNORMAL HIGH (ref 0.44–1.00)
GFR calc Af Amer: 19 mL/min — ABNORMAL LOW (ref >60)
GFR, EST NON AFRICAN AMERICAN: 16 mL/min — AB (ref >60)
GLUCOSE: 138 mg/dL — AB (ref 65–99)
POTASSIUM: 3.3 mmol/L — AB (ref 3.5–5.1)
SODIUM: 141 mmol/L (ref 135–145)

## 2015-10-19 LAB — IRON AND TIBC
IRON: 57 ug/dL (ref 28–170)
SATURATION RATIOS: 36 % — AB (ref 10.4–31.8)
TIBC: 158 ug/dL — ABNORMAL LOW (ref 250–450)
UIBC: 101 ug/dL

## 2015-10-19 LAB — CBC
HEMATOCRIT: 28.3 % — AB (ref 36.0–46.0)
HEMOGLOBIN: 8.9 g/dL — AB (ref 12.0–15.0)
MCH: 29.6 pg (ref 26.0–34.0)
MCHC: 31.4 g/dL (ref 30.0–36.0)
MCV: 94 fL (ref 78.0–100.0)
Platelets: 307 10*3/uL (ref 150–400)
RBC: 3.01 MIL/uL — ABNORMAL LOW (ref 3.87–5.11)
RDW: 18.3 % — AB (ref 11.5–15.5)
WBC: 10 10*3/uL (ref 4.0–10.5)

## 2015-10-19 LAB — RETICULOCYTES
RBC.: 2.95 MIL/uL — ABNORMAL LOW (ref 3.87–5.11)
Retic Count, Absolute: 82.6 10*3/uL (ref 19.0–186.0)
Retic Ct Pct: 2.8 % (ref 0.4–3.1)

## 2015-10-19 LAB — VITAMIN B12: Vitamin B-12: 329 pg/mL (ref 180–914)

## 2015-10-19 LAB — FOLATE: FOLATE: 6.9 ng/mL (ref >5.9)

## 2015-10-19 MED ORDER — PREDNISONE 20 MG PO TABS
20.0000 mg | ORAL_TABLET | Freq: Every day | ORAL | Status: DC
  Administered 2015-10-20: 20 mg via ORAL
  Filled 2015-10-19: qty 1

## 2015-10-19 MED ORDER — METOLAZONE 10 MG PO TABS
10.0000 mg | ORAL_TABLET | Freq: Two times a day (BID) | ORAL | Status: DC
  Administered 2015-10-19 – 2015-10-25 (×13): 10 mg via ORAL
  Filled 2015-10-19 (×14): qty 1

## 2015-10-19 MED ORDER — DEXTROSE 5 % IV SOLN
160.0000 mg | Freq: Two times a day (BID) | INTRAVENOUS | Status: DC
  Administered 2015-10-19 – 2015-10-22 (×6): 160 mg via INTRAVENOUS
  Filled 2015-10-19 (×3): qty 16
  Filled 2015-10-19 (×2): qty 10
  Filled 2015-10-19 (×2): qty 16
  Filled 2015-10-19: qty 10

## 2015-10-19 MED ORDER — HEPARIN SODIUM (PORCINE) 5000 UNIT/ML IJ SOLN: 5000.0000 [IU] | Freq: Three times a day (TID) | INTRAMUSCULAR | Status: DC

## 2015-10-19 MED ORDER — SPIRONOLACTONE 50 MG PO TABS
50.0000 mg | ORAL_TABLET | Freq: Every day | ORAL | Status: DC
Start: 2015-10-19 — End: 2015-10-25
  Administered 2015-10-19 – 2015-10-25 (×7): 50 mg via ORAL
  Filled 2015-10-19 (×7): qty 1

## 2015-10-19 MED ORDER — HEPARIN SODIUM (PORCINE) 5000 UNIT/ML IJ SOLN
5000.0000 [IU] | Freq: Three times a day (TID) | INTRAMUSCULAR | Status: AC
  Administered 2015-10-19: 5000 [IU] via SUBCUTANEOUS
  Filled 2015-10-19: qty 1

## 2015-10-19 NOTE — Progress Notes (Signed)
[facility] PROGRESS NOTE  Sandy [name] [id number] DOB: [birth year] DOA: 10/14/2015 PCP: No primary care provider on file.  HPI/Brief narrative 40 year old female who has a history of FSGS diagnosed 2 years ago at [place], hypertension came to the hospital with worsening swelling throughout the body and fatigue  Assessment/Plan: Nephrotic Syndrome/CKD 4 with Anasarca -Patient has FSGS, confirmed by biopsy in [place], [place]. -was on IV lasix and then on bumex gtt with poor response -now on Torsemide and metolazone per Renal -urine output improving, 3.9L negative -continue this regimen, Renal to decide regarding biopsy -needs close Renal FU at DC, new to [place] -also on ARB due to heavy proteinuria  Hypertension urgency -improving, continue hydralazine, labetalol  Anemia of chronic disease -Hb stable, will check anemia panel  DVT prophylaxis Heparin subq   Code Status: Full Family Communication: Pt in room Disposition Plan: when volume status better  Consultants:  nephrology  Procedures:    Antibiotics: Anti-infectives    None      HPI/Subjective: Starting to feel better, swelling improving from upper body  Objective: Filed Vitals:   10/18/15 0648 10/18/15 1449 10/18/15 2045 10/19/15 0508  BP: 137/89 140/86 153/97 131/87  Pulse:  79 74 73  Temp:  97.8 F (36.6 C) 97.6 F (36.4 C) 97.9 F (36.6 C)  TempSrc:  Oral Oral Oral  Resp:  18 16 18   Height:      Weight:    113.762 kg (250 lb 12.8 oz)  SpO2:  100% 98% 97%    Intake/Output Summary (Last 24 hours) at 10/19/15 1151 Last data filed at 10/19/15 0143  Gross per 24 hour  Intake    960 ml  Output   1900 ml  Net   -940 ml   Filed Weights   10/17/15 0432 10/18/15 0505 10/19/15 0508  Weight: 114.397 kg (252 lb 3.2 oz) 112.991 kg (249 lb 1.6 oz) 113.762 kg (250 lb 12.8 oz)    Exam:   General:  AAOx3, no distress  Cardiovascular: regular, s1, s2  Respiratory: normal resp  effort, no wheezing  Abdomen: soft,nondistended, pos BS  Musculoskeletal: 3+ pitting LE edema bilaterally  Data Reviewed: Basic Metabolic Panel:  Recent Labs Lab 10/15/15 0551 10/16/15 0531 10/17/15 0508 10/18/15 0500 10/19/15 0820  NA 144 141 143 141 141  K 3.8 3.4* 3.4* 3.7 3.3*  CL 118* 114* 114* 111 112*  CO2 17* 15* 20* 19* 19*  GLUCOSE 86 87 111* 113* 138*  BUN 74* 65* 57* 63* 72*  CREATININE 3.51* 3.16* 2.97* 3.37* 3.43*  CALCIUM 7.6* 7.6* 8.0* 7.9* 7.8*   Liver Function Tests:  Recent Labs Lab 10/14/15 1604 10/15/15 0551  AST 14* 14*  ALT 8* 12*  ALKPHOS 79 77  BILITOT 0.7 0.3  PROT 5.0* 4.2*  ALBUMIN 1.7* 1.5*   No results for input(s): LIPASE, AMYLASE in the last 168 hours. No results for input(s): AMMONIA in the last 168 hours. CBC:  Recent Labs Lab 10/14/15 1613 10/15/15 0551 10/16/15 0531 10/19/15 0820  WBC 10.1 6.9 6.1 10.0  NEUTROABS 6.7  --   --   --   HGB 9.9* 8.2* 7.4* 8.9*  HCT 31.1* 26.0* 23.1* 28.3*  MCV 92.6 93.5 93.1 94.0  PLT 361 237 206 307   Cardiac Enzymes: No results for input(s): CKTOTAL, CKMB, CKMBINDEX, TROPONINI in the last 168 hours. BNP (last 3 results)  Recent Labs  10/14/15 1614  BNP 201.7*    ProBNP (last 3 results) No results for  input(s): PROBNP in the last 8760 hours.  CBG: No results for input(s): GLUCAP in the last 168 hours.  No results found for this or any previous visit (from the past 240 hour(s)).   Studies: No results found.  Scheduled Meds: . heparin  5,000 Units Subcutaneous 3 times per day  . hydrALAZINE  50 mg Oral TID  . labetalol  200 mg Oral BID  . losartan  50 mg Oral Daily  . metolazone  5 mg Oral BID  . predniSONE  60 mg Oral Q breakfast  . sodium bicarbonate  650 mg Oral BID  . sodium chloride  3 mL Intravenous Q12H  . torsemide  100 mg Oral BID   Continuous Infusions:    Active Problems:   Edema   Anasarca associated with disorder of kidney   CKD (chronic kidney  disease), stage IV ([place])   Hypertension   [facility] Pager [phone]. If 7PM-7AM, please contact night-coverage at [email or web address], password [facility] 10/19/2015, 11:51 AM  LOS: 4 days

## 2015-10-19 NOTE — Consult Note (Signed)
Chief Complaint: renal disease, FSGS Referring Physician: Dr. Roney Jaffe HPI: Sandy [name] is an 40 y.o. 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  She recently was treated with cytoxan, but has relapsed and progressed.  She has had increased fluid retention throughout her body and has been admitted.  She is new to this area and a random renal biopsy is requested by the nephrologist  Past Medical History:  Past Medical History  Diagnosis Date  . Renal disorder   . Hypertension     Past Surgical History:  Past Surgical History  Procedure Laterality Date  . Cesarean section      Family History: History reviewed. No pertinent family history.  Social History:  reports that she has been smoking.  She does not have any smokeless tobacco history on file. She reports that she does not drink alcohol or use illicit drugs.  Allergies:  Allergies  Allergen Reactions  . Lisinopril Cough    Medications:   Medication List    ASK your doctor about these medications        acetaminophen 500 MG tablet  Commonly known as:  TYLENOL  Take 500 mg by mouth every 6 (six) hours as needed for moderate pain.     hydrALAZINE 50 MG tablet  Commonly known as:  APRESOLINE  Take 50 mg by mouth 3 (three) times daily.     predniSONE 10 MG tablet  Commonly known as:  DELTASONE  Take 10 mg by mouth daily with breakfast.     torsemide 100 MG tablet  Commonly known as:  DEMADEX  Take 100 mg by mouth daily.        Please HPI for pertinent positives, otherwise complete 10 system ROS negative, except some recent SOB due to fluid retention, but lasix has improved this significantly.  Mallampati Score: MD Evaluation Airway: WNL Heart: WNL Abdomen: WNL Chest/ Lungs: WNL ASA  Classification: 3 Mallampati/Airway Score: Two  Physical Exam: BP 131/87 mmHg  Pulse 73  Temp(Src) 97.9 F (36.6 C) (Oral)  Resp 18  Ht _0  (1.702 m)  Wt 250  lb 12.8 oz (113.762 kg)  BMI 39.27 kg/m2  SpO2 97% Body mass index is 39.27 kg/(m^2). General: pleasant, obese black female who is laying in bed in NAD Heart: regular, rate, and rhythm.  Normal s1,s2. No obvious murmurs, gallops, or rubs noted.  Palpable radial and pedal pulses bilaterally Lungs: CTAB, but decreased breath sounds throughout, no wheezes, rhonchi, or rales noted.  Respiratory effort nonlabored Abd: soft, NT, ND, +BS, no masses, hernias, or organomegaly MS: all 4 extremities are symmetrical with no cyanosis, clubbing.  She has significant lower extremity pitting edema Psych: A&Ox3 with an appropriate affect.   Labs: Results for orders placed or performed during the hospital encounter of 10/14/15 (from the past 48 hour(s))  Basic metabolic panel     Status: Abnormal   Collection Time: 10/18/15  5:00 AM  Result Value Ref Range   Sodium 141 135 - 145 mmol/L   Potassium 3.7 3.5 - 5.1 mmol/L   Chloride 111 101 - 111 mmol/L   CO2 19 (L) 22 - 32 mmol/L   Glucose, Bld 113 (H) 65 - 99 mg/dL   BUN 63 (H) 6 - 20 mg/dL   Creatinine, Ser 3.37 (H) 0.44 - 1.00 mg/dL   Calcium 7.9 (L) 8.9 - 10.3 mg/dL   GFR calc non Af Amer 16 (L) >60  mL/min   GFR calc Af Amer 19 (L) >60 mL/min    Comment: (NOTE) The eGFR has been calculated using the CKD EPI equation. This calculation has not been validated in all clinical situations. eGFR's persistently <60 mL/min signify possible Chronic Kidney Disease.    Anion gap 11 5 - 15  Basic metabolic panel     Status: Abnormal   Collection Time: 10/19/15  8:20 AM  Result Value Ref Range   Sodium 141 135 - 145 mmol/L   Potassium 3.3 (L) 3.5 - 5.1 mmol/L   Chloride 112 (H) 101 - 111 mmol/L   CO2 19 (L) 22 - 32 mmol/L   Glucose, Bld 138 (H) 65 - 99 mg/dL   BUN 72 (H) 6 - 20 mg/dL   Creatinine, Ser 3.43 (H) 0.44 - 1.00 mg/dL   Calcium 7.8 (L) 8.9 - 10.3 mg/dL   GFR calc non Af Amer 16 (L) >60 mL/min   GFR calc Af Amer 19 (L) >60 mL/min    Comment:  (NOTE) The eGFR has been calculated using the CKD EPI equation. This calculation has not been validated in all clinical situations. eGFR's persistently <60 mL/min signify possible Chronic Kidney Disease.    Anion gap 10 5 - 15  CBC     Status: Abnormal   Collection Time: 10/19/15  8:20 AM  Result Value Ref Range   WBC 10.0 4.0 - 10.5 K/uL   RBC 3.01 (L) 3.87 - 5.11 MIL/uL   Hemoglobin 8.9 (L) 12.0 - 15.0 g/dL   HCT 28.3 (L) 36.0 - 46.0 %   MCV 94.0 78.0 - 100.0 fL   MCH 29.6 26.0 - 34.0 pg   MCHC 31.4 30.0 - 36.0 g/dL   RDW 18.3 (H) 11.5 - 15.5 %   Platelets 307 150 - 400 K/uL  Vitamin B12     Status: None   Collection Time: 10/19/15 12:00 PM  Result Value Ref Range   Vitamin B-12 329 180 - 914 pg/mL    Comment: (NOTE) This assay is not validated for testing neonatal or myeloproliferative syndrome specimens for Vitamin B12 levels. Performed at [facility]   Folate     Status: None   Collection Time: 10/19/15 12:00 PM  Result Value Ref Range   Folate 6.9 >5.9 ng/mL    Comment: Performed at [facility]  Iron and TIBC     Status: Abnormal   Collection Time: 10/19/15 12:00 PM  Result Value Ref Range   Iron 57 28 - 170 ug/dL   TIBC 158 (L) 250 - 450 ug/dL   Saturation Ratios 36 (H) 10.4 - 31.8 %   UIBC 101 ug/dL    Comment: Performed at [facility]  Ferritin     Status: None   Collection Time: 10/19/15 12:00 PM  Result Value Ref Range   Ferritin 84 11 - 307 ng/mL    Comment: Performed at [facility]  Reticulocytes     Status: Abnormal   Collection Time: 10/19/15 12:00 PM  Result Value Ref Range   Retic Ct Pct 2.8 0.4 - 3.1 %   RBC. 2.95 (L) 3.87 - 5.11 MIL/uL   Retic Count, Manual 82.6 19.0 - 186.0 K/uL    Imaging: No results found.  Assessment/Plan 1. Nephrotic syndrome -plan for random renal biopsy tomorrow with Dr. Anselm Pancoast -NPO p MN, hold heparin prior to IR procedure tomorrow -check CBC, PT/PTT in am -patient on hydralazine and  labetalol here.  BP is well  controlled at this time.  It will need to be under 160/90 for [place] to proceed with the procedure tomorrow. -Risks and Benefits discussed with the patient including, but not limited to bleeding, infection, damage to adjacent structures or low yield requiring additional tests. All of the patient's questions were answered, patient is agreeable to proceed. Consent signed and in chart.    Thank you for this interesting consult.  I greatly enjoyed meeting Tawnya Crook.  A copy of this report was sent to the requesting provider on this date.  Electronically Signed: Henreitta Cea 10/19/2015, 3:19 PM   I spent a total of 20 Minutes   in face to face in clinical consultation, greater than 50% of which was counseling/coordinating care for nephrotic syndrome, desire for random renal biopsy.

## 2015-10-19 NOTE — Progress Notes (Signed)
[facility] Progress Note   Subjective: 2.6L off yest, no change in weight  Filed Vitals:   10/18/15 0648 10/18/15 1449 10/18/15 2045 10/19/15 0508  BP: 137/89 140/86 153/97 131/87  Pulse:  79 74 73  Temp:  97.8 F (36.6 C) 97.6 F (36.4 C) 97.9 F (36.6 C)  TempSrc:  Oral Oral Oral  Resp:  18 16 18   Height:      Weight:    113.762 kg (250 lb 12.8 oz)  SpO2:  100% 98% 97%    Inpatient medications: . heparin  5,000 Units Subcutaneous 3 times per day  . hydrALAZINE  50 mg Oral TID  . labetalol  200 mg Oral BID  . losartan  50 mg Oral Daily  . metolazone  5 mg Oral BID  . predniSONE  60 mg Oral Q breakfast  . sodium bicarbonate  650 mg Oral BID  . sodium chloride  3 mL Intravenous Q12H  . torsemide  100 mg Oral BID     sodium chloride, acetaminophen, ondansetron **OR** ondansetron (ZOFRAN) IV, oxyCODONE, sodium chloride  Exam: Obese pleasant young adult WF no distress JVP 12 cm Chest occ crackles , mostly clear bilat RRR no mrg Abd obese soft ntnd striae no hsm no ascites MS no joint effusion/ deformity Ext 3+ bilat LE edema from feet to hips, 1-2+ sacral edema Neuro is alert, Ox 3 appropriate  Na 144 K 3.8 CO2 17 BUN 74 Creat 3.51 CA 7.6 eGFR 16 ml/min Alb 1.5 WBC 6.9 Hb 8.2 plt 237 Glu 86 CXR no acute UA >300 prot, no rbc/ wbc  Assessment: 1 Nephrotic syndrome - have d/w pt's nephrologist in [place], she has had numerous courses of steroids over the last 2.5 years, also repeated CyA courses. Patient has continued to relapse and progress, baseline creat was around 3 he said.  Most recently he gave her a course of 6 monthly IV Cytoxan doses and was not planning on any maintenance Rx due to his concerns about the overall immunosuppressive exposure she has had.  Pred reduced down to 20/ d.  2 HTN on hydralazine, labetalol added here 3 Renal failure, chronic +/- acute; stage 4 4 Met acidosis started po bicarb  Plan - have d/w my colleagues,  plan is for repeat renal biopsy to see what level of chronicity/ fibrosis there may be.  Consider rituxamab if biopsy is acceptable.  Will get vein mapping and show patient dialysis videos.  Have d/w patient and informed her that renal prognosis with progressive FSGS failing multiple therapies is poor. Questions answered. Will add aldactone, stop losartan w ^'d creat since adding.     Kelly Splinter MD [facility] pager [phone]    cell [phone] 10/19/2015, 2:17 PM    Recent Labs Lab 10/17/15 0508 10/18/15 0500 10/19/15 0820  NA 143 141 141  K 3.4* 3.7 3.3*  CL 114* 111 112*  CO2 20* 19* 19*  GLUCOSE 111* 113* 138*  BUN 57* 63* 72*  CREATININE 2.97* 3.37* 3.43*  CALCIUM 8.0* 7.9* 7.8*    Recent Labs Lab 10/14/15 1604 10/15/15 0551  AST 14* 14*  ALT 8* 12*  ALKPHOS 79 77  BILITOT 0.7 0.3  PROT 5.0* 4.2*  ALBUMIN 1.7* 1.5*    Recent Labs Lab 10/14/15 1613 10/15/15 0551 10/16/15 0531 10/19/15 0820  WBC 10.1 6.9 6.1 10.0  NEUTROABS 6.7  --   --   --   HGB 9.9* 8.2* 7.4* 8.9*  HCT 31.1* 26.0*  23.1* 28.3*  MCV 92.6 93.5 93.1 94.0  PLT 361 237 206 307

## 2015-10-20 ENCOUNTER — Inpatient Hospital Stay (HOSPITAL_COMMUNITY): Payer: Medicaid - Out of State

## 2015-10-20 DIAGNOSIS — N184 Chronic kidney disease, stage 4 (severe): Secondary | ICD-10-CM

## 2015-10-20 LAB — BASIC METABOLIC PANEL
Anion gap: 12 (ref 5–15)
BUN: 84 mg/dL — ABNORMAL HIGH (ref 6–20)
CHLORIDE: 108 mmol/L (ref 101–111)
CO2: 21 mmol/L — ABNORMAL LOW (ref 22–32)
Calcium: 7.6 mg/dL — ABNORMAL LOW (ref 8.9–10.3)
Creatinine, Ser: 3.46 mg/dL — ABNORMAL HIGH (ref 0.44–1.00)
GFR, EST AFRICAN AMERICAN: 18 mL/min — AB (ref >60)
GFR, EST NON AFRICAN AMERICAN: 16 mL/min — AB (ref >60)
Glucose, Bld: 98 mg/dL (ref 65–99)
POTASSIUM: 3.4 mmol/L — AB (ref 3.5–5.1)
SODIUM: 141 mmol/L (ref 135–145)

## 2015-10-20 LAB — CBC
HCT: 25.8 % — ABNORMAL LOW (ref 36.0–46.0)
HEMOGLOBIN: 8.2 g/dL — AB (ref 12.0–15.0)
MCH: 29.6 pg (ref 26.0–34.0)
MCHC: 31.8 g/dL (ref 30.0–36.0)
MCV: 93.1 fL (ref 78.0–100.0)
PLATELETS: 287 10*3/uL (ref 150–400)
RBC: 2.77 MIL/uL — AB (ref 3.87–5.11)
RDW: 18.1 % — ABNORMAL HIGH (ref 11.5–15.5)
WBC: 9.3 10*3/uL (ref 4.0–10.5)

## 2015-10-20 LAB — PROTIME-INR
INR: 0.95 (ref 0.00–1.49)
PROTHROMBIN TIME: 12.5 s (ref 11.6–15.2)

## 2015-10-20 LAB — APTT: aPTT: 24 seconds (ref 24–37)

## 2015-10-20 MED ORDER — VITAMIN D (ERGOCALCIFEROL) 1.25 MG (50000 UNIT) PO CAPS
50000.0000 [IU] | ORAL_CAPSULE | ORAL | Status: DC
  Filled 2015-10-20: qty 1

## 2015-10-20 MED ORDER — FENTANYL CITRATE (PF) 100 MCG/2ML IJ SOLN
INTRAMUSCULAR | Status: AC
  Filled 2015-10-20: qty 2

## 2015-10-20 MED ORDER — MIDAZOLAM HCL 2 MG/2ML IJ SOLN
INTRAMUSCULAR | Status: AC | PRN
  Administered 2015-10-20: 1 mg via INTRAVENOUS
  Administered 2015-10-20 (×3): 0.5 mg via INTRAVENOUS

## 2015-10-20 MED ORDER — PREDNISONE 50 MG PO TABS
60.0000 mg | ORAL_TABLET | Freq: Every day | ORAL | Status: DC
  Administered 2015-10-21 – 2015-10-25 (×5): 60 mg via ORAL
  Filled 2015-10-20 (×5): qty 1

## 2015-10-20 MED ORDER — MIDAZOLAM HCL 2 MG/2ML IJ SOLN
INTRAMUSCULAR | Status: AC
  Filled 2015-10-20: qty 4

## 2015-10-20 MED ORDER — FLUMAZENIL 0.5 MG/5ML IV SOLN
INTRAVENOUS | Status: AC
  Filled 2015-10-20: qty 5

## 2015-10-20 MED ORDER — FENTANYL CITRATE (PF) 100 MCG/2ML IJ SOLN
INTRAMUSCULAR | Status: AC | PRN
  Administered 2015-10-20: 25 ug via INTRAVENOUS
  Administered 2015-10-20: 50 ug via INTRAVENOUS
  Administered 2015-10-20: 25 ug via INTRAVENOUS

## 2015-10-20 MED ORDER — NALOXONE HCL 0.4 MG/ML IJ SOLN
INTRAMUSCULAR | Status: AC
  Filled 2015-10-20: qty 1

## 2015-10-20 NOTE — Progress Notes (Signed)
Small amount of very light blood tinged noted to sheets. Left Flank site post biopsy noted to have small amount of blood to band aide and one side of band aide loose. VSS and no other acute changes noted at this time. Dry band aide applied and will monitor site closely

## 2015-10-20 NOTE — Progress Notes (Addendum)
[facility] PROGRESS NOTE  Arleatha Winch [id number] DOB: 1976/08/14 DOA: 10/14/2015 PCP: No primary care provider on file.  HPI/Brief narrative 40 year old female who has a history of FSGS diagnosed 2 years ago at [place], hypertension came to the hospital with worsening swelling throughout the body and fatigue  Assessment/Plan: Nephrotic Syndrome/CKD 4 with Anasarca -severe volume overload -Patient has FSGS, confirmed by biopsy in [place], [place] on prednisone 20mg  now -was on IV lasix and then on bumex gtt with poor response -now on Torsemide, Aldactone and metolazone per Renal, lasix gtt added 1/26 -urine output improving, 5.5L negative -continue this regimen, for Renal Biopsy per IR today -needs close Renal FU at DC, new to [place] -was also started on ARB due to heavy proteinuria, stopped with worsening of GFR -HD education, Rd consult  Hypertension urgency -improving, continue hydralazine, labetalol  Severe Vitamin D defi -start Replacement, D2 50,000 units  Anemia of chronic disease -anemia panel c/w chronic disease -needs EPO, defer to Renal  DVT prophylaxis Heparin subq -hold for Biopsy  Code Status: Full Family Communication: Pt in room Disposition Plan: when volume status better  Consultants:  nephrology  Procedures:    Antibiotics: Anti-infectives    None      HPI/Subjective: Feels ok, asks abt compression stockings,  swelling improving from upper body  Objective: Filed Vitals:   10/19/15 0508 10/19/15 1500 10/19/15 2149 10/20/15 0537  BP: 131/87 128/80 143/100 146/95  Pulse: 73 68 63 67  Temp: 97.9 F (36.6 C) 98.1 F (36.7 C) 97.9 F (36.6 C) 97.8 F (36.6 C)  TempSrc: Oral Oral Oral Oral  Resp: 18  16 16   Height:      Weight: 113.762 kg (250 lb 12.8 oz)   111.585 kg (246 lb)  SpO2: 97% 99% 99% 99%    Intake/Output Summary (Last 24 hours) at 10/20/15 0743 Last data filed at 10/20/15 0732  Gross per 24 hour   Intake    272 ml  Output   2600 ml  Net  -2328 ml   Filed Weights   10/18/15 0505 10/19/15 0508 10/20/15 0537  Weight: 112.991 kg (249 lb 1.6 oz) 113.762 kg (250 lb 12.8 oz) 111.585 kg (246 lb)    Exam:   General:  AAOx3, no distress  Cardiovascular: regular, s1, s2  Respiratory: normal resp effort, no wheezing  Abdomen: soft,nondistended, pos BS, abd wall and back swelling too  Musculoskeletal: 3+ pitting LE edema bilaterally  Data Reviewed: Basic Metabolic Panel:  Recent Labs Lab 10/16/15 0531 10/17/15 0508 10/18/15 0500 10/19/15 0820 10/20/15 0514  NA 141 143 141 141 141  K 3.4* 3.4* 3.7 3.3* 3.4*  CL 114* 114* 111 112* 108  CO2 15* 20* 19* 19* 21*  GLUCOSE 87 111* 113* 138* 98  BUN 65* 57* 63* 72* 84*  CREATININE 3.16* 2.97* 3.37* 3.43* 3.46*  CALCIUM 7.6* 8.0* 7.9* 7.8* 7.6*   Liver Function Tests:  Recent Labs Lab 10/14/15 1604 10/15/15 0551  AST 14* 14*  ALT 8* 12*  ALKPHOS 79 77  BILITOT 0.7 0.3  PROT 5.0* 4.2*  ALBUMIN 1.7* 1.5*   No results for input(s): LIPASE, AMYLASE in the last 168 hours. No results for input(s): AMMONIA in the last 168 hours. CBC:  Recent Labs Lab 10/14/15 1613 10/15/15 0551 10/16/15 0531 10/19/15 0820 10/20/15 0514  WBC 10.1 6.9 6.1 10.0 9.3  NEUTROABS 6.7  --   --   --   --   HGB 9.9* 8.2* 7.4* 8.9*  8.2*  HCT 31.1* 26.0* 23.1* 28.3* 25.8*  MCV 92.6 93.5 93.1 94.0 93.1  PLT 361 237 206 307 287   Cardiac Enzymes: No results for input(s): CKTOTAL, CKMB, CKMBINDEX, TROPONINI in the last 168 hours. BNP (last 3 results)  Recent Labs  10/14/15 1614  BNP 201.7*    ProBNP (last 3 results) No results for input(s): PROBNP in the last 8760 hours.  CBG: No results for input(s): GLUCAP in the last 168 hours.  No results found for this or any previous visit (from the past 240 hour(s)).   Studies: No results found.  Scheduled Meds: . furosemide  160 mg Intravenous Q12H  . heparin  5,000 Units  Subcutaneous 3 times per day  . hydrALAZINE  50 mg Oral TID  . labetalol  200 mg Oral BID  . metolazone  10 mg Oral BID  . predniSONE  20 mg Oral Q breakfast  . sodium bicarbonate  650 mg Oral BID  . sodium chloride  3 mL Intravenous Q12H  . spironolactone  50 mg Oral Daily  . torsemide  100 mg Oral BID  . Vitamin D (Ergocalciferol)  50,000 Units Oral Q7 days   Continuous Infusions:    Active Problems:   Edema   Anasarca associated with disorder of kidney   CKD (chronic kidney disease), stage IV ([place])   Hypertension   [facility] Pager [phone]. If 7PM-7AM, please contact night-coverage at [email or web address], password [facility] 10/20/2015, 7:43 AM  LOS: 5 days

## 2015-10-20 NOTE — Progress Notes (Signed)
Report given to oncoming RN. Band aide on left side is clean and dry with no drainage noted to area at this time. Will continue to monitor closely

## 2015-10-20 NOTE — Procedures (Signed)
US guided core biopsies of left kidney lower pole.  Three 16 gauge cores were obtained.  Minimal blood loss and no immediate complication.

## 2015-10-20 NOTE — Progress Notes (Signed)
Pt received back to room post Left Kidney Biopsy. Band aide to left flank area intact and dry. VSS. Pt lying on stomach per instructions for one hour. Pt verbalized understanding of post biopsy instructions.

## 2015-10-20 NOTE — Progress Notes (Signed)
  [facility] Progress Note   Subjective: 2.6L off yest, no change in weight  Filed Vitals:   10/20/15 1224 10/20/15 1229 10/20/15 1234 10/20/15 1245  BP: 128/93 120/90 123/94 126/89  Pulse: 70 70 70 72  Temp:    97.6 F (36.4 C)  TempSrc:    Oral  Resp: _0 Height:      Weight:      SpO2: 100% 100% 100% 100%    Inpatient medications: . furosemide  160 mg Intravenous Q12H  . hydrALAZINE  50 mg Oral TID  . labetalol  200 mg Oral BID  . metolazone  10 mg Oral BID  . predniSONE  20 mg Oral Q breakfast  . sodium bicarbonate  650 mg Oral BID  . sodium chloride  3 mL Intravenous Q12H  . spironolactone  50 mg Oral Daily  . torsemide  100 mg Oral BID  . Vitamin D (Ergocalciferol)  50,000 Units Oral Q7 days     sodium chloride, acetaminophen, ondansetron **OR** ondansetron (ZOFRAN) IV, oxyCODONE, sodium chloride  Exam: Obese pleasant young adult WF no distress JVP 12 cm Chest occ crackles , mostly clear bilat RRR no mrg Abd obese soft ntnd striae no hsm no ascites MS no joint effusion/ deformity Ext 3+ bilat LE edema from feet to hips, 1-2+ sacral edema Neuro is alert, Ox 3 appropriate  Na 144 K 3.8 CO2 17 BUN 74 Creat 3.51 CA 7.6 eGFR 16 ml/min Alb 1.5 WBC 6.9 Hb 8.2 plt 237 Glu 86 CXR no acute UA >300 prot, no rbc/ wbc  Assessment: 1 Nephrotic syndrome / FSGS bx July 2015 - sp multiple steroid and CyA courses and recently completed IV Cytoxan per neph at [facility].  Creat at baseline 3- 3.5.  Anasarca and severe NS (UPC ratio 21gm). SP repeat biopsy today.  Resume diuretics when able to get OOB.  2 HTN on hydralazine, labetalol added here 3 Renal failure, chronic +/- acute; stage 4 4 Met acidosis started po bicarb  Plan - attempting to diurese w max po demadex and zaroxlyn.  Vein map and dialysis education in case needs HD for anasarca.  F/u Bx results. Pred 60 mg /d for now, see if she will respond.    Kelly Splinter MD [facility] pager [phone]    cell [phone] 10/20/2015, 1:01 PM    Recent Labs Lab 10/18/15 0500 10/19/15 0820 10/20/15 0514  NA 141 141 141  K 3.7 3.3* 3.4*  CL 111 112* 108  CO2 19* 19* 21*  GLUCOSE 113* 138* 98  BUN 63* 72* 84*  CREATININE 3.37* 3.43* 3.46*  CALCIUM 7.9* 7.8* 7.6*    Recent Labs Lab 10/14/15 1604 10/15/15 0551  AST 14* 14*  ALT 8* 12*  ALKPHOS 79 77  BILITOT 0.7 0.3  PROT 5.0* 4.2*  ALBUMIN 1.7* 1.5*    Recent Labs Lab 10/14/15 1613  10/16/15 0531 10/19/15 0820 10/20/15 0514  WBC 10.1  < > 6.1 10.0 9.3  NEUTROABS 6.7  --   --   --   --   HGB 9.9*  < > 7.4* 8.9* 8.2*  HCT 31.1*  < > 23.1* 28.3* 25.8*  MCV 92.6  < > 93.1 94.0 93.1  PLT 361  < > 206 307 287  < > = values in this interval not displayed.

## 2015-10-20 NOTE — Progress Notes (Signed)
VASCULAR LAB PRELIMINARY  PRELIMINARY  PRELIMINARY  PRELIMINARY  Right  Upper Extremity Vein Map    Cephalic  Segment Diameter Depth Comment  1. Axilla 3.1 mm 8.35 mm   2. Mid upper arm 2.97 mm 6.16 mm   3. Above AC 4.56 mm 4.14 mm Partial thrombosis  Branch measures 2.42mm  4. In AC 7.48 mm 2.19 mm Partial thrombosis  5. Below AC mm mm IV  6. Mid forearm mm mm Thrombosed   7. Wrist mm mm Too small   mm mm    mm mm    mm mm    Basilic  Segment Diameter Depth Comment  1. Upper arm 5.57 mm 10.3 mm   2. Mid upper arm 2.82 mm 17.9 mm   3. Above AC 2.97 mm 12.6 mm   4. In AC 1.91 mm 10.5 mm   5. Below AC mm mm Too small in forearm  6. Mid forearm mm mm   7. Wrist mm mm    mm mm    mm mm    mm mm    Left Upper Extremity Vein Map    Cephalic  Segment Diameter Depth Comment  1. Axilla 2.56 mm 11.2 mm   2. Mid upper arm 1.55 mm 8.63 mm   3. Above AC 1.73 mm 5.02 mm   4. In AC 4.02 mm 2.88 mm   5. Below AC 1.69 mm 6.12 mm   6. Mid forearm 1.44 mm 6.44 mm   7. Wrist 1.23 mm 4.75 mm    mm mm    mm mm    mm mm    Basilic  Segment Diameter Depth Comment  1. Upper arm 3.97 mm 13.1 mm   2. Mid upper arm 2.1 mm 15 mm   3. Above AC 2.1 mm 15 mm   4. In AC 1.69 mm 9.13 mm   5. Below AC 1.41 mm 6.16 mm   6. Mid forearm  mm mm   7. Wrist  mm mm     mm mm    mm mm    mm mm     Kolbee Stallman, RVT 10/20/2015, 9:12 AM

## 2015-10-21 LAB — CBC
HCT: 26.6 % — ABNORMAL LOW (ref 36.0–46.0)
HEMOGLOBIN: 8.4 g/dL — AB (ref 12.0–15.0)
MCH: 29.8 pg (ref 26.0–34.0)
MCHC: 31.6 g/dL (ref 30.0–36.0)
MCV: 94.3 fL (ref 78.0–100.0)
PLATELETS: 268 10*3/uL (ref 150–400)
RBC: 2.82 MIL/uL — AB (ref 3.87–5.11)
RDW: 18.2 % — ABNORMAL HIGH (ref 11.5–15.5)
WBC: 10.1 10*3/uL (ref 4.0–10.5)

## 2015-10-21 LAB — BASIC METABOLIC PANEL
ANION GAP: 12 (ref 5–15)
BUN: 88 mg/dL — ABNORMAL HIGH (ref 6–20)
CHLORIDE: 106 mmol/L (ref 101–111)
CO2: 23 mmol/L (ref 22–32)
Calcium: 7.6 mg/dL — ABNORMAL LOW (ref 8.9–10.3)
Creatinine, Ser: 3.37 mg/dL — ABNORMAL HIGH (ref 0.44–1.00)
GFR calc Af Amer: 19 mL/min — ABNORMAL LOW (ref >60)
GFR, EST NON AFRICAN AMERICAN: 16 mL/min — AB (ref >60)
Glucose, Bld: 122 mg/dL — ABNORMAL HIGH (ref 65–99)
POTASSIUM: 3.2 mmol/L — AB (ref 3.5–5.1)
SODIUM: 141 mmol/L (ref 135–145)

## 2015-10-21 MED ORDER — OXYCODONE HCL 5 MG PO TABS
5.0000 mg | ORAL_TABLET | ORAL | Status: DC | PRN
  Administered 2015-10-21 (×2): 5 mg via ORAL
  Administered 2015-10-22 – 2015-10-25 (×13): 10 mg via ORAL
  Filled 2015-10-21 (×2): qty 2
  Filled 2015-10-21: qty 1
  Filled 2015-10-21 (×9): qty 2
  Filled 2015-10-21: qty 1
  Filled 2015-10-21 (×2): qty 2

## 2015-10-21 MED ORDER — TRAMADOL HCL 50 MG PO TABS
50.0000 mg | ORAL_TABLET | Freq: Once | ORAL | Status: DC
  Filled 2015-10-21 (×2): qty 1

## 2015-10-21 MED ORDER — HYDRALAZINE HCL 50 MG PO TABS
75.0000 mg | ORAL_TABLET | Freq: Three times a day (TID) | ORAL | Status: DC
  Administered 2015-10-21 – 2015-10-24 (×9): 75 mg via ORAL
  Filled 2015-10-21 (×10): qty 1

## 2015-10-21 MED ORDER — POTASSIUM CHLORIDE CRYS ER 10 MEQ PO TBCR
30.0000 meq | EXTENDED_RELEASE_TABLET | Freq: Two times a day (BID) | ORAL | Status: AC
  Administered 2015-10-21 – 2015-10-23 (×4): 30 meq via ORAL
  Filled 2015-10-21 (×4): qty 1

## 2015-10-21 MED ORDER — POTASSIUM CHLORIDE CRYS ER 20 MEQ PO TBCR
40.0000 meq | EXTENDED_RELEASE_TABLET | Freq: Once | ORAL | Status: AC
  Administered 2015-10-21: 40 meq via ORAL
  Filled 2015-10-21: qty 2

## 2015-10-21 NOTE — Progress Notes (Signed)
[facility] Progress Note   Subjective: 3200 cc UOP yest. Sp renal biospy, no gross hematuria.  I/O neg 8L but only 3 kg down by wts.   Filed Vitals:   10/20/15 1454 10/20/15 2042 10/21/15 0505 10/21/15 1331  BP: 139/92 131/74 139/95 126/88  Pulse: 75 73 74 66  Temp: 98 F (36.7 C) 98.2 F (36.8 C) 98 F (36.7 C) 98.2 F (36.8 C)  TempSrc: Oral Oral Oral Oral  Resp: _0 Height:      Weight:   111.267 kg (245 lb 4.8 oz)   SpO2: 100% 97% 100% 100%    Inpatient medications: . furosemide  160 mg Intravenous Q12H  . hydrALAZINE  75 mg Oral TID  . labetalol  200 mg Oral BID  . metolazone  10 mg Oral BID  . predniSONE  60 mg Oral Q breakfast  . sodium bicarbonate  650 mg Oral BID  . sodium chloride  3 mL Intravenous Q12H  . spironolactone  50 mg Oral Daily  . torsemide  100 mg Oral BID  . Vitamin D (Ergocalciferol)  50,000 Units Oral Q7 days     sodium chloride, acetaminophen, ondansetron **OR** ondansetron (ZOFRAN) IV, oxyCODONE, sodium chloride  Exam: Obese pleasant young adult WF no distress JVP 12 cm Chest occ crackles , mostly clear bilat RRR no mrg Abd obese soft ntnd striae no hsm no ascites MS no joint effusion/ deformity Ext 3+ bilat LE edema from feet to hips, 1-2+ sacral edema Neuro is alert, Ox 3 appropriate  Na 144 K 3.8 CO2 17 BUN 74 Creat 3.51 CA 7.6 eGFR 16 ml/min Alb 1.5 WBC 6.9 Hb 8.2 plt 237 Glu 86 CXR no acute UA >300 prot, no rbc/ wbc  Assessment: 1 Nephrotic syndrome / CKD 4/ FSGS bx July 2015 - sp multiple steroid and CyA courses and recently completed IV Cytoxan per neph at [facility].  Creat at baseline ~ 3.0.  Anasarca and severe NS, UPC ratio 21gm here. SP repeat biopsy 1/27.  Moved to [place] to get help from her family. Has 38 yo dtr, single mother, both living now w her sister here. Plan is to cont diuresis (still 60lb +), await bx results.  If can't control edema with medication will need dialysis but there is no  urgency. Pt understands. Biopsy should be back mid next week  2 HTN on hydralazine, labetalol 3 Met acidosis started po bicarb 4 Anasarca - cont attempt to diurese with po zarox/ po demadex/ IV lasix  Plan - as above   Kelly Splinter MD [facility] pager [phone]    cell [phone] 10/21/2015, 1:53 PM    Recent Labs Lab 10/19/15 0820 10/20/15 0514 10/21/15 0827  NA 141 141 141  K 3.3* 3.4* 3.2*  CL 112* 108 106  CO2 19* 21* 23  GLUCOSE 138* 98 122*  BUN 72* 84* 88*  CREATININE 3.43* 3.46* 3.37*  CALCIUM 7.8* 7.6* 7.6*    Recent Labs Lab 10/14/15 1604 10/15/15 0551  AST 14* 14*  ALT 8* 12*  ALKPHOS 79 77  BILITOT 0.7 0.3  PROT 5.0* 4.2*  ALBUMIN 1.7* 1.5*    Recent Labs Lab 10/14/15 1613  10/19/15 0820 10/20/15 0514 10/21/15 0827  WBC 10.1  < > 10.0 9.3 10.1  NEUTROABS 6.7  --   --   --   --   HGB 9.9*  < > 8.9* 8.2* 8.4*  HCT 31.1*  < > 28.3* 25.8* 26.6*  MCV 92.6  < > 94.0 93.1 94.3  PLT 361  < > 307 287 268  < > = values in this interval not displayed.

## 2015-10-21 NOTE — Progress Notes (Addendum)
[facility] PROGRESS NOTE  Sandy [name] [id number] DOB: 10/09/1975 DOA: 10/14/2015 PCP: No primary care provider on file.  Brief narrative 40 year old female who has a history of FSGS diagnosed 2 years ago at [facility], hypertension came to the hospital with worsening swelling throughout the body and fatigue. She found to have anasarca.   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 Increase dose to 60 mg. -was on IV lasix and then on bumex gtt with poor response. was also started on ARB due to heavy proteinuria, stopped with worsening of GFR -now on Torsemide, Aldactone and metolazone per Renal, lasix gtt added on 1/26. Has good diuresis and is -8.6 L -Left renal biopsy done 1/27. -Renal consult closely following. Appreciate recommendations.   Hypertension urgency -improving. continue hydralazine and labetalol. Increase hydralazine dose today.  Severe Vitamin D deficiency -Added  Anemia of chronic disease -needs EPO, defer to Renal  Left foot pain No signs of acute gout. No joint tenderness.. Will check uric acid. Continue oxycodone when necessary.  Hypokalemia Replenish   DVT prophylaxis Cutaneous heparin  Diet: Heart healthy  Code Status: Full code Family Communication: None at bedside Disposition Plan: Home once anasarca improved   Consultants:  Renal  Procedures:  Left renal biopsy on 1/27  Antibiotics:  None  HPI/Subjective: Patient seen and examined. Reports her leg and abdominal swelling to have gone down. Complains of pain in her left foot worsened with weightbearing.  Objective: Filed Vitals:   10/20/15 2042 10/21/15 0505  BP: 131/74 139/95  Pulse: 73 74  Temp: 98.2 F (36.8 C) 98 F (36.7 C)  Resp: 16 18    Intake/Output Summary (Last 24 hours) at 10/21/15 1043 Last data filed at 10/21/15 0813  Gross per 24 hour   Intake    360 ml  Output   2650 ml  Net  -2290 ml   Filed Weights   10/19/15 0508 10/20/15 0537 10/21/15 0505  Weight: 113.762 kg (250 lb 12.8 oz) 111.585 kg (246 lb) 111.267 kg (245 lb 4.8 oz)    Exam:   General:  Middle aged female not in distress  HEENT: No pallor, moist mucosa  Chest: Clear bilaterally  CVS: Normal S1 and S2, no murmurs  GI: Soft, pitting abdominal wall edema, nontender, bowel sounds present, healed biopsy site clean  Musculoskeletal: On, 2+ pitting edema, tender to pressure over plantar surface of left foot. No joint tenderness.  CNS: Alert and oriented  Data Reviewed: Basic Metabolic Panel:  Recent Labs Lab 10/17/15 0508 10/18/15 0500 10/19/15 0820 10/20/15 0514 10/21/15 0827  NA 143 141 141 141 141  K 3.4* 3.7 3.3* 3.4* 3.2*  CL 114* 111 112* 108 106  CO2 20* 19* 19* 21* 23  GLUCOSE 111* 113* 138* 98 122*  BUN 57* 63* 72* 84* 88*  CREATININE 2.97* 3.37* 3.43* 3.46* 3.37*  CALCIUM 8.0* 7.9* 7.8* 7.6* 7.6*   Liver Function Tests:  Recent Labs Lab 10/14/15 1604 10/15/15 0551  AST 14* 14*  ALT 8* 12*  ALKPHOS 79 77  BILITOT 0.7 0.3  PROT 5.0* 4.2*  ALBUMIN 1.7* 1.5*   No results for input(s): LIPASE, AMYLASE in the last 168 hours. No results for input(s): AMMONIA in the last 168 hours. CBC:  Recent Labs Lab 10/14/15 1613 10/15/15 0551 10/16/15 0531 10/19/15 0820 10/20/15 0514 10/21/15 0827  WBC 10.1 6.9 6.1 10.0 9.3 10.1  NEUTROABS 6.7  --   --   --   --   --  HGB 9.9* 8.2* 7.4* 8.9* 8.2* 8.4*  HCT 31.1* 26.0* 23.1* 28.3* 25.8* 26.6*  MCV 92.6 93.5 93.1 94.0 93.1 94.3  PLT 361 237 206 307 287 268   Cardiac Enzymes: No results for input(s): CKTOTAL, CKMB, CKMBINDEX, TROPONINI in the last 168 hours. BNP (last 3 results)  Recent Labs  10/14/15 1614  BNP 201.7*    ProBNP (last 3 results) No results for input(s): PROBNP in the last 8760 hours.  CBG: No results for input(s): GLUCAP in the last 168 hours.  No  results found for this or any previous visit (from the past 240 hour(s)).   Studies: US Biopsy  10/20/2015  INDICATION: 40 year old with history of focal segmental glomerulosclerosis. Request for renal biopsy. EXAM: ULTRASOUND-GUIDED RENAL BIOPSY MEDICATIONS: None. ANESTHESIA/SEDATION: Fentanyl 100 mcg IV; Versed 2.5 mg IV Moderate Sedation Time:  20 minutes The patient was continuously monitored during the procedure by the interventional radiology nurse under my direct supervision. FLUOROSCOPY TIME:  None COMPLICATIONS: None immediate. PROCEDURE: Informed written consent was obtained from the patient after a thorough discussion of the procedural risks, benefits and alternatives. All questions were addressed. A timeout was performed prior to the initiation of the procedure. Both kidneys were evaluated with the patient in a prone position. The left kidney was selected for biopsy. The left flank was prepped with chlorhexidine and a sterile field was created. The skin and soft tissues were anesthetized with 1% lidocaine. Using ultrasound guidance, a 16 gauge core needle was directed into the left kidney lower pole. Core biopsy was obtained. A total of 3 core biopsies were performed using a similar technique. Specimens were placed in saline. Bandage placed over the puncture site. FINDINGS: Core samples obtained from the left kidney lower pole. No significant bleeding or hematoma formation following the core biopsies. IMPRESSION: Successful ultrasound-guided core biopsies of the left kidney lower pole. Electronically Signed   By: Markus Daft M.D.   On: 10/20/2015 13:58    Scheduled Meds: . furosemide  160 mg Intravenous Q12H  . hydrALAZINE  50 mg Oral TID  . labetalol  200 mg Oral BID  . metolazone  10 mg Oral BID  . potassium chloride  40 mEq Oral Once  . predniSONE  60 mg Oral Q breakfast  . sodium bicarbonate  650 mg Oral BID  . sodium chloride  3 mL Intravenous Q12H  . spironolactone  50 mg Oral Daily   . torsemide  100 mg Oral BID  . Vitamin D (Ergocalciferol)  50,000 Units Oral Q7 days   Continuous Infusions:     Time spent: 25 minutes    Sandy [name], [name]  [facility] Pager [phone]. If 7PM-7AM, please contact night-coverage at [email or web address], password [facility] 10/21/2015, 10:43 AM  LOS: 6 days

## 2015-10-22 DIAGNOSIS — N183 Chronic kidney disease, stage 3 (moderate): Secondary | ICD-10-CM

## 2015-10-22 DIAGNOSIS — N179 Acute kidney failure, unspecified: Secondary | ICD-10-CM

## 2015-10-22 LAB — BASIC METABOLIC PANEL
Anion gap: 11 (ref 5–15)
BUN: 98 mg/dL — AB (ref 6–20)
CHLORIDE: 105 mmol/L (ref 101–111)
CO2: 23 mmol/L (ref 22–32)
CREATININE: 3.51 mg/dL — AB (ref 0.44–1.00)
Calcium: 7.6 mg/dL — ABNORMAL LOW (ref 8.9–10.3)
GFR calc Af Amer: 18 mL/min — ABNORMAL LOW (ref >60)
GFR calc non Af Amer: 16 mL/min — ABNORMAL LOW (ref >60)
GLUCOSE: 107 mg/dL — AB (ref 65–99)
Potassium: 4.2 mmol/L (ref 3.5–5.1)
SODIUM: 139 mmol/L (ref 135–145)

## 2015-10-22 LAB — URIC ACID: Uric Acid, Serum: 10.4 mg/dL — ABNORMAL HIGH (ref 2.3–6.6)

## 2015-10-22 MED ORDER — LIDOCAINE 5 % EX PTCH
1.0000 | MEDICATED_PATCH | CUTANEOUS | Status: DC
  Administered 2015-10-22 – 2015-10-25 (×4): 1 via TRANSDERMAL
  Filled 2015-10-22 (×5): qty 1

## 2015-10-22 MED ORDER — FUROSEMIDE 10 MG/ML IJ SOLN
160.0000 mg | Freq: Four times a day (QID) | INTRAVENOUS | Status: DC
  Administered 2015-10-22 – 2015-10-24 (×7): 160 mg via INTRAVENOUS
  Filled 2015-10-22: qty 16
  Filled 2015-10-22 (×2): qty 10
  Filled 2015-10-22 (×5): qty 16

## 2015-10-22 NOTE — Progress Notes (Signed)
[facility] PROGRESS NOTE  Sandy [name] [id number] DOB: September 13, 1976 DOA: 10/14/2015 PCP: No primary care provider on file.  Brief narrative 40 year old female who has a history of FSGS diagnosed 2 years ago at [facility], hypertension came to the hospital with worsening swelling throughout the body and fatigue. She was found to have anasarca with worsened renal function.   Assessment/Plan: Nephrotic Syndrome/CKD 4 with Anasarca -Patient has FSGS, confirmed by biopsy in [place], [place] and was on prednisone 20 mg daily. Increased dose to 60 mg. -On Lasix GTT. Diabetes 2 more liters compared to yesterday but weight hasn't gone down. (Swelling has reduced on exam) -Continue Torsemide, Aldactone and metolazone per Renal. -Left renal biopsy done 1/27. -Appreciate renal follow-up. Continue aggressive diuresis. Hopefully will be able to avoid dialysis.   Hypertension urgency -improving with blood pressure medications adjusted. (Hydralazine and labetalol.  Severe Vitamin D deficiency -Added ergocalciferol  Anemia of chronic disease -needs EPO, defer to Renal  Left plantar fasciitis. Continue when necessary oxycodone. Added Lidoderm patch. Instructed on stretching exercises. Check uric acid.  Hypokalemia Replenished   DVT prophylaxis Cutaneous heparin  Diet: Heart healthy  Code Status: Full code Family Communication: None at bedside Disposition Plan: Home once anasarca improved   Consultants:  Renal  Procedures:  Left renal biopsy on 1/27  Antibiotics:  None  HPI/Subjective: Patient seen and examined. Reports her leg swellings to have improved. Still has left plantar pain.  Objective: Filed Vitals:   10/21/15 2103 10/22/15 0547  BP: 146/99 134/85  Pulse: 67 79  Temp: 98.1 F (36.7 C) 98.2 F (36.8 C)  Resp: 20 18    Intake/Output Summary (Last 24 hours) at 10/22/15 1223 Last data filed at 10/22/15 0809  Gross per 24 hour  Intake    880 ml   Output   2550 ml  Net  -1670 ml   Filed Weights   10/20/15 0537 10/21/15 0505 10/22/15 0547  Weight: 111.585 kg (246 lb) 111.267 kg (245 lb 4.8 oz) 111.086 kg (244 lb 14.4 oz)    Exam:   General:   not in distress  HEENT:  moist mucosa  Chest: Clear bilaterally  CVS: Normal S1 and S2, no murmurs  GI: Soft, improved abdominal wall edema, nontender, bowel sounds present,   Musculoskeletal: On, 2+ pitting edema (improved), tender to pressure over plantar surface of left foot. No joint tenderness.  CNS: Alert and oriented  Data Reviewed: Basic Metabolic Panel:  Recent Labs Lab 10/18/15 0500 10/19/15 0820 10/20/15 0514 10/21/15 0827 10/22/15 0511  NA 141 141 141 141 139  K 3.7 3.3* 3.4* 3.2* 4.2  CL 111 112* 108 106 105  CO2 19* 19* 21* 23 23  GLUCOSE 113* 138* 98 122* 107*  BUN 63* 72* 84* 88* 98*  CREATININE 3.37* 3.43* 3.46* 3.37* 3.51*  CALCIUM 7.9* 7.8* 7.6* 7.6* 7.6*   Liver Function Tests: No results for input(s): AST, ALT, ALKPHOS, BILITOT, PROT, ALBUMIN in the last 168 hours. No results for input(s): LIPASE, AMYLASE in the last 168 hours. No results for input(s): AMMONIA in the last 168 hours. CBC:  Recent Labs Lab 10/16/15 0531 10/19/15 0820 10/20/15 0514 10/21/15 0827  WBC 6.1 10.0 9.3 10.1  HGB 7.4* 8.9* 8.2* 8.4*  HCT 23.1* 28.3* 25.8* 26.6*  MCV 93.1 94.0 93.1 94.3  PLT 206 307 287 268   Cardiac Enzymes: No results for input(s): CKTOTAL, CKMB, CKMBINDEX, TROPONINI in the last 168 hours. BNP (last 3 results)  Recent Labs  10/14/15  1614  BNP 201.7*    ProBNP (last 3 results) No results for input(s): PROBNP in the last 8760 hours.  CBG: No results for input(s): GLUCAP in the last 168 hours.  No results found for this or any previous visit (from the past 240 hour(s)).   Studies: US Biopsy  10/20/2015  INDICATION: 40 year old with history of focal segmental glomerulosclerosis. Request for renal biopsy. EXAM: ULTRASOUND-GUIDED  RENAL BIOPSY MEDICATIONS: None. ANESTHESIA/SEDATION: Fentanyl 100 mcg IV; Versed 2.5 mg IV Moderate Sedation Time:  20 minutes The patient was continuously monitored during the procedure by the interventional radiology nurse under my direct supervision. FLUOROSCOPY TIME:  None COMPLICATIONS: None immediate. PROCEDURE: Informed written consent was obtained from the patient after a thorough discussion of the procedural risks, benefits and alternatives. All questions were addressed. A timeout was performed prior to the initiation of the procedure. Both kidneys were evaluated with the patient in a prone position. The left kidney was selected for biopsy. The left flank was prepped with chlorhexidine and a sterile field was created. The skin and soft tissues were anesthetized with 1% lidocaine. Using ultrasound guidance, a 16 gauge core needle was directed into the left kidney lower pole. Core biopsy was obtained. A total of 3 core biopsies were performed using a similar technique. Specimens were placed in saline. Bandage placed over the puncture site. FINDINGS: Core samples obtained from the left kidney lower pole. No significant bleeding or hematoma formation following the core biopsies. IMPRESSION: Successful ultrasound-guided core biopsies of the left kidney lower pole. Electronically Signed   By: Markus Daft M.D.   On: 10/20/2015 13:58    Scheduled Meds: . furosemide  160 mg Intravenous Q12H  . hydrALAZINE  75 mg Oral TID  . labetalol  200 mg Oral BID  . lidocaine  1 patch Transdermal Q24H  . metolazone  10 mg Oral BID  . potassium chloride  30 mEq Oral BID  . predniSONE  60 mg Oral Q breakfast  . sodium bicarbonate  650 mg Oral BID  . sodium chloride  3 mL Intravenous Q12H  . spironolactone  50 mg Oral Daily  . torsemide  100 mg Oral BID  . traMADol  50 mg Oral Once  . Vitamin D (Ergocalciferol)  50,000 Units Oral Q7 days   Continuous Infusions:     Time spent: 25 minutes    Sandy [name],  [place]  [facility] Pager [phone]. If 7PM-7AM, please contact night-coverage at [email or web address], password [facility] 10/22/2015, 12:23 PM  LOS: 7 days

## 2015-10-22 NOTE — Progress Notes (Addendum)
  [facility] Progress Note   Subjective: stable  Filed Vitals:   10/21/15 1331 10/21/15 2103 10/22/15 0547 10/22/15 1337  BP: 126/88 146/99 134/85 140/87  Pulse: 66 67 79 78  Temp: 98.2 F (36.8 C) 98.1 F (36.7 C) 98.2 F (36.8 C) 98 F (36.7 C)  TempSrc: Oral  Oral Oral  Resp: '18 20 18 18  '$ Height:      Weight:   111.086 kg (244 lb 14.4 oz)   SpO2: 100% 100% 99% 100%    Inpatient medications: . furosemide  160 mg Intravenous Q12H  . hydrALAZINE  75 mg Oral TID  . labetalol  200 mg Oral BID  . lidocaine  1 patch Transdermal Q24H  . metolazone  10 mg Oral BID  . potassium chloride  30 mEq Oral BID  . predniSONE  60 mg Oral Q breakfast  . sodium bicarbonate  650 mg Oral BID  . sodium chloride  3 mL Intravenous Q12H  . spironolactone  50 mg Oral Daily  . torsemide  100 mg Oral BID  . traMADol  50 mg Oral Once  . Vitamin D (Ergocalciferol)  50,000 Units Oral Q7 days     sodium chloride, acetaminophen, ondansetron **OR** ondansetron (ZOFRAN) IV, oxyCODONE, sodium chloride  Exam: Obese pleasant young adult WF no distress JVP 12 cm Chest occ crackles , mostly clear bilat RRR no mrg Abd obese soft ntnd striae no hsm no ascites MS no joint effusion/ deformity Ext 3+ bilat LE edema from feet to hips, 1-2+ sacral edema Neuro is alert, Ox 3 appropriate  Na 144 K 3.8 CO2 17 BUN 74 Creat 3.51 CA 7.6 eGFR 16 ml/min Alb 1.5 WBC 6.9 Hb 8.2 plt 237 Glu 86 CXR no acute UA >300 prot, no rbc/ wbc  Assessment: 1 Nephrotic syndrome / CKD 4/ FSGS bx July 2015 - sp multiple steroid/ CyA courses and recently completed IV Cytoxan per neph at [facility], however has relapsed again w anasarca and severe NS, UPC ratio 21gm here. Moved to [place] 1-2 wks ago for family support for her 50 yo daughter.  Creat at baseline ~ 3.0. May be near ESRD.  D/w MD in [place] who was not planning any further Cytoxan and was not optimistic given volume of immunosuppression patient has  received w/o stabilization.  SP repeat biopsy 1/27.  Trying to diurese but weights not coming down. Will change to IV lasix 160 mg q 6 hrs, dc torsemide, cont aldact/ metolazone. See if biopsy results would support further immunosuppressive Rx (e.g. Rituxan).  Will need dialysis if cannot get volume off, pt aware and agreeable.  2 HTN on hydralazine, labetalol 3 Met acidosis started po bicarb  Plan - as above   Kelly Splinter MD [facility] pager [phone]    cell [phone] 10/22/2015, 6:16 PM    Recent Labs Lab 10/20/15 0514 10/21/15 0827 10/22/15 0511  NA 141 141 139  K 3.4* 3.2* 4.2  CL 108 106 105  CO2 21* 23 23  GLUCOSE 98 122* 107*  BUN 84* 88* 98*  CREATININE 3.46* 3.37* 3.51*  CALCIUM 7.6* 7.6* 7.6*   No results for input(s): AST, ALT, ALKPHOS, BILITOT, PROT, ALBUMIN in the last 168 hours.  Recent Labs Lab 10/19/15 0820 10/20/15 0514 10/21/15 0827  WBC 10.0 9.3 10.1  HGB 8.9* 8.2* 8.4*  HCT 28.3* 25.8* 26.6*  MCV 94.0 93.1 94.3  PLT 307 287 268

## 2015-10-23 DIAGNOSIS — M722 Plantar fascial fibromatosis: Secondary | ICD-10-CM | POA: Diagnosis not present

## 2015-10-23 LAB — BASIC METABOLIC PANEL
Anion gap: 12 (ref 5–15)
BUN: 104 mg/dL — ABNORMAL HIGH (ref 6–20)
CHLORIDE: 103 mmol/L (ref 101–111)
CO2: 24 mmol/L (ref 22–32)
CREATININE: 3.4 mg/dL — AB (ref 0.44–1.00)
Calcium: 7.8 mg/dL — ABNORMAL LOW (ref 8.9–10.3)
GFR, EST AFRICAN AMERICAN: 19 mL/min — AB (ref >60)
GFR, EST NON AFRICAN AMERICAN: 16 mL/min — AB (ref >60)
Glucose, Bld: 148 mg/dL — ABNORMAL HIGH (ref 65–99)
POTASSIUM: 3.9 mmol/L (ref 3.5–5.1)
SODIUM: 139 mmol/L (ref 135–145)

## 2015-10-23 MED ORDER — DARBEPOETIN ALFA 150 MCG/0.3ML IJ SOSY
150.0000 ug | PREFILLED_SYRINGE | INTRAMUSCULAR | Status: DC
  Administered 2015-10-23: 150 ug via SUBCUTANEOUS
  Filled 2015-10-23: qty 0.3

## 2015-10-23 NOTE — Progress Notes (Signed)
[facility] Progress Note   Subjective: - 2.3 liters on max diuretic regimen  Filed Vitals:   10/22/15 1337 10/22/15 2026 10/22/15 2228 10/23/15 0442  BP: 140/87 142/89 149/94 136/96  Pulse: 78 75 72 82  Temp: 98 F (36.7 C) 98.1 F (36.7 C)  98.1 F (36.7 C)  TempSrc: Oral Oral  Oral  Resp: _0 Height:      Weight:    110.179 kg (242 lb 14.4 oz)  SpO2: 100% 100%  100%    Inpatient medications: . furosemide  160 mg Intravenous Q6H  . hydrALAZINE  75 mg Oral TID  . labetalol  200 mg Oral BID  . lidocaine  1 patch Transdermal Q24H  . metolazone  10 mg Oral BID  . predniSONE  60 mg Oral Q breakfast  . sodium bicarbonate  650 mg Oral BID  . sodium chloride  3 mL Intravenous Q12H  . spironolactone  50 mg Oral Daily  . traMADol  50 mg Oral Once  . Vitamin D (Ergocalciferol)  50,000 Units Oral Q7 days     sodium chloride, acetaminophen, ondansetron **OR** ondansetron (ZOFRAN) IV, oxyCODONE, sodium chloride  Exam: Obese pleasant young adult WF no distress JVP 12 cm Chest occ crackles , mostly clear bilat RRR no mrg Abd obese soft ntnd striae no hsm no ascites MS no joint effusion/ deformity Ext 3+ bilat LE edema from feet to hips, 1-2+ sacral edema Neuro is alert, Ox 3 appropriate  Na 144 K 3.8 CO2 17 BUN 74 Creat 3.51 CA 7.6 eGFR 16 ml/min Alb 1.5 WBC 6.9 Hb 8.2 plt 237 Glu 86 CXR no acute UA >300 prot, no rbc/ wbc  Assessment: 1 Nephrotic syndrome / CKD 4/ FSGS bx July 2015 - sp multiple steroid/ CyA courses and recently completed IV Cytoxan per neph at [facility], however has relapsed again w anasarca and severe NS, UPC ratio 21gm here. Moved to [place] for family support for her 38 yo daughter.  Creat at baseline ~ 3.0. May be near ESRD.  D/w MD in [place] who was not planning any further Cytoxan and was not optimistic given volume of immunosuppression patient has received w/o stabilization.  SP repeat biopsy 1/27- no results yet.  Trying to  diurese but weights not coming down- maybe a little better last 24 hours. IV lasix 160 mg q 6 hrs, cont aldact/ metolazone. See if biopsy results would support further immunosuppressive Rx (e.g. Rituxan).  Will need dialysis if cannot get volume off, pt aware and agreeable.  She also says that her sister has offered to get her a kidney  2 HTN on hydralazine, labetalol- should improve if we can get volume to improve  3 Met acidosis  po bicarb- better 4. Anemia- not helping- will give ESA - iron is OK      Tykesha Konicki A   10/23/2015, 11:36 AM    Recent Labs Lab 10/21/15 0827 10/22/15 0511 10/23/15 0850  NA 141 139 139  K 3.2* 4.2 3.9  CL 106 105 103  CO2 _1 GLUCOSE 122* 107* 148*  BUN 88* 98* 104*  CREATININE 3.37* 3.51* 3.40*  CALCIUM 7.6* 7.6* 7.8*   No results for input(s): AST, ALT, ALKPHOS, BILITOT, PROT, ALBUMIN in the last 168 hours.  Recent Labs Lab 10/19/15 0820 10/20/15 0514 10/21/15 0827  WBC 10.0 9.3 10.1  HGB 8.9* 8.2* 8.4*  HCT 28.3* 25.8* 26.6*  MCV 94.0 93.1 94.3  PLT 307 287  [place]

## 2015-10-23 NOTE — Care Management Note (Signed)
Case Management Note  Patient Details  Name: Sandy [name] MRN: [id number] Date of Birth: Feb 23, 1976  Subjective/Objective:   Noted-nephro-following-iv lasix, awaiting renal bx results-may need dialysis.  Lives in [place] now,has a safe home,pharmacy.Provided w/pcp listing.               Action/Plan:d/c plan home.   Expected Discharge Date:                  Expected Discharge Plan:  Home/Self Care  In-House Referral:     Discharge planning Services  CM Consult  Post Acute Care Choice:    Choice offered to:     DME Arranged:    DME Agency:     HH Arranged:    HH Agency:     Status of Service:  In process, will continue to follow  Medicare Important Message Given:    Date Medicare IM Given:    Medicare IM give by:    Date Additional Medicare IM Given:    Additional Medicare Important Message give by:     If discussed at [place] of Stay Meetings, dates discussed:    Additional Comments:  Dessa Phi, RN 10/23/2015, 11:45 AM

## 2015-10-23 NOTE — Progress Notes (Signed)
[facility] PROGRESS NOTE  Sandy [name] [id number] DOB: March 08, 1976 DOA: 10/14/2015 PCP: No primary care provider on file.  Brief narrative 40 year old female who has a history of FSGS diagnosed 2 years ago at [facility], hypertension came to the hospital with worsening swelling throughout the body and fatigue. She was found to have anasarca with worsened renal function.   Assessment/Plan: Nephrotic Syndrome/CKD 4 with Anasarca -Patient has FSGS, confirmed by biopsy in [place], [place] and was on prednisone 20 mg daily. Increased dose to 60 mg. -On Lasix GTT (dose increased to 160 mg every 6 hours). Weight has not gone down much. Creatinine 3.4 this morning. -Discontinued Torsemide, continue Aldactone and metolazone per Renal. -Left renal biopsy done 1/27. Pending results. -Appreciate renal follow-up. Dr. Jonnie Finner discussed with patient's M.D. in [place] who was not planning any further Cytoxan and said that he was not optimistic given patient did not show much improvement despite significant immunosuppression. Patient related dialysis if unable to diabetes adequately.   Hypertension urgency -improving with blood pressure medications adjusted. (Hydralazine and labetalol.  Severe Vitamin D deficiency -Added ergocalciferol  Anemia of chronic disease -needs EPO, defer to Renal  Left plantar fasciitis. Continue when necessary oxycodone. Added Lidoderm patch. Instructed on stretching exercises. Check uric acid.  Metabolic acidosis On bicarbonate supplement.  Hypokalemia Replenished   DVT prophylaxis heparin  Diet: Heart healthy  Code Status: Full code Family Communication: None at bedside Disposition Plan: Continue current management.   Consultants:  Renal  Procedures:  Left renal biopsy on 1/27  Antibiotics:  None  HPI/Subjective: Patient seen and examined. Left leg pain slightly improved. Still has significant leg swellings  Objective: Filed  Vitals:   10/22/15 2228 10/23/15 0442  BP: 149/94 136/96  Pulse: 72 82  Temp:  98.1 F (36.7 C)  Resp:  18    Intake/Output Summary (Last 24 hours) at 10/23/15 1121 Last data filed at 10/23/15 0725  Gross per 24 hour  Intake    916 ml  Output   3275 ml  Net  -2359 ml   Filed Weights   10/21/15 0505 10/22/15 0547 10/23/15 0442  Weight: 111.267 kg (245 lb 4.8 oz) 111.086 kg (244 lb 14.4 oz) 110.179 kg (242 lb 14.4 oz)    Exam:   General:   not in distress  HEENT:  moist mucosa  Chest: Clear bilaterally  CVS: Normal S1 and S2, no murmurs  GI: Soft, improved abdominal wall edema, nontender, bowel sounds present,   Musculoskeletal: 3+ pitting edema up to the thighs., tender to pressure over plantar surface of left foot (improved). No joint tenderness.  CNS: Alert and oriented  Data Reviewed: Basic Metabolic Panel:  Recent Labs Lab 10/19/15 0820 10/20/15 0514 10/21/15 0827 10/22/15 0511 10/23/15 0850  NA 141 141 141 139 139  K 3.3* 3.4* 3.2* 4.2 3.9  CL 112* 108 106 105 103  CO2 19* 21* 23 23 24   GLUCOSE 138* 98 122* 107* 148*  BUN 72* 84* 88* 98* 104*  CREATININE 3.43* 3.46* 3.37* 3.51* 3.40*  CALCIUM 7.8* 7.6* 7.6* 7.6* 7.8*   Liver Function Tests: No results for input(s): AST, ALT, ALKPHOS, BILITOT, PROT, ALBUMIN in the last 168 hours. No results for input(s): LIPASE, AMYLASE in the last 168 hours. No results for input(s): AMMONIA in the last 168 hours. CBC:  Recent Labs Lab 10/19/15 0820 10/20/15 0514 10/21/15 0827  WBC 10.0 9.3 10.1  HGB 8.9* 8.2* 8.4*  HCT 28.3* 25.8* 26.6*  MCV 94.0 93.1 94.3  PLT 307 287 268   Cardiac Enzymes: No results for input(s): CKTOTAL, CKMB, CKMBINDEX, TROPONINI in the last 168 hours. BNP (last 3 results)  Recent Labs  10/14/15 1614  BNP 201.7*    ProBNP (last 3 results) No results for input(s): PROBNP in the last 8760 hours.  CBG: No results for input(s): GLUCAP in the last 168 hours.  No results  found for this or any previous visit (from the past 240 hour(s)).   Studies: No results found.  Scheduled Meds: . furosemide  160 mg Intravenous Q6H  . hydrALAZINE  75 mg Oral TID  . labetalol  200 mg Oral BID  . lidocaine  1 patch Transdermal Q24H  . metolazone  10 mg Oral BID  . predniSONE  60 mg Oral Q breakfast  . sodium bicarbonate  650 mg Oral BID  . sodium chloride  3 mL Intravenous Q12H  . spironolactone  50 mg Oral Daily  . traMADol  50 mg Oral Once  . Vitamin D (Ergocalciferol)  50,000 Units Oral Q7 days   Continuous Infusions:     Time spent: 25 minutes    Nalu Troublefield, [place]  [facility] Pager [phone]. If 7PM-7AM, please contact night-coverage at [email or web address], password [facility] 10/23/2015, 11:21 AM  LOS: 8 days

## 2015-10-24 DIAGNOSIS — M722 Plantar fascial fibromatosis: Secondary | ICD-10-CM

## 2015-10-24 LAB — BASIC METABOLIC PANEL
Anion gap: 10 (ref 5–15)
BUN: 110 mg/dL — AB (ref 6–20)
CALCIUM: 7.8 mg/dL — AB (ref 8.9–10.3)
CHLORIDE: 102 mmol/L (ref 101–111)
CO2: 27 mmol/L (ref 22–32)
CREATININE: 3.33 mg/dL — AB (ref 0.44–1.00)
GFR calc Af Amer: 19 mL/min — ABNORMAL LOW (ref >60)
GFR calc non Af Amer: 17 mL/min — ABNORMAL LOW (ref >60)
GLUCOSE: 92 mg/dL (ref 65–99)
Potassium: 4.3 mmol/L (ref 3.5–5.1)
Sodium: 139 mmol/L (ref 135–145)

## 2015-10-24 MED ORDER — TORSEMIDE 100 MG PO TABS
100.0000 mg | ORAL_TABLET | Freq: Two times a day (BID) | ORAL | Status: DC
  Administered 2015-10-24 – 2015-10-25 (×3): 100 mg via ORAL
  Filled 2015-10-24 (×3): qty 1

## 2015-10-24 MED ORDER — HYDRALAZINE HCL 50 MG PO TABS
50.0000 mg | ORAL_TABLET | Freq: Three times a day (TID) | ORAL | Status: DC
  Administered 2015-10-24 – 2015-10-25 (×4): 50 mg via ORAL
  Filled 2015-10-24 (×5): qty 1

## 2015-10-24 NOTE — Progress Notes (Signed)
IV infiltrated, IV team paged but upon arrival to room, MD had changed IV lasix to PO. Pt did not want IV to be restrted if not necessary. MD paged, ordered to leave IV out for now and reassess in AM. Still on telemetry. Callie Fielding RN

## 2015-10-24 NOTE — Progress Notes (Signed)
[facility] PROGRESS NOTE  Sandy [name] [id number] DOB: 1975/11/28 DOA: 10/14/2015 PCP: No primary care provider on file.  Brief narrative 40 year old female who has a history of FSGS diagnosed 2 years ago at [facility] [place], hypertension came to the hospital with worsening swelling throughout the body and fatigue. She was found to have anasarca with worsened renal function.   Assessment/Plan: Nephrotic Syndrome/CKD 4 with Anasarca -Patient has FSGS, confirmed by biopsy in [place], [place] and was on prednisone 20 mg daily. Increased dose to 60 mg. -On Lasix GTT (dose increased to 160 mg every 6 hours). Weight has not gone down much. (Has been -15.5 L since admission) Creatinine 3.3 this morning. -Discontinued Torsemide, continue Aldactone and metolazone per Renal. -Left renal biopsy done 1/27. Result still pending. -Appreciate renal follow-up. Dr. Jonnie Finner discussed with patient's M.D. in [place] who was not planning any further Cytoxan and said that he was not optimistic given patient did not show much improvement despite significant immunosuppression. Patient needs  dialysis if unable to diurese adequately.   Hypertension urgency -improving with blood pressure medications adjusted. (Hydralazine and labetalol.  Severe Vitamin D deficiency -Added ergocalciferol  Anemia of chronic disease -aranesp per renal  Left plantar fasciitis. Continue when necessary oxycodone. Added Lidoderm patch. Instructed on stretching exercises. Symptoms better.   Metabolic acidosis On bicarbonate supplement with improvement.  Hypokalemia Replenished   DVT prophylaxis heparin  Diet: Heart healthy  Code Status: Full code Family Communication: None at bedside Disposition Plan: Continue current management.   Consultants:  Renal  Procedures:  Left renal biopsy on 1/27  Antibiotics:  None  HPI/Subjective: Patient seen and examined. Left foot pain improved. Still has significant  leg swellings  Objective: Filed Vitals:   10/23/15 2146 10/24/15 0541  BP: 149/96 131/83  Pulse: 72 74  Temp: 98.1 F (36.7 C) 98.5 F (36.9 C)  Resp: 18 18    Intake/Output Summary (Last 24 hours) at 10/24/15 1220 Last data filed at 10/24/15 1100  Gross per 24 hour  Intake    360 ml  Output   3150 ml  Net  -2790 ml   Filed Weights   10/22/15 0547 10/23/15 0442 10/24/15 0541  Weight: 111.086 kg (244 lb 14.4 oz) 110.179 kg (242 lb 14.4 oz) 109.9 kg (242 lb 4.6 oz)    Exam:   General:   not in distress  HEENT:  moist mucosa  Chest: Clear bilaterally  CVS: Normal S1 and S2, no murmurs  GI: Soft, improved abdominal wall edema, nontender, bowel sounds present,   Musculoskeletal: 3+ pitting edema up to the groin., Minimal tenderness to pressure over plantar surface of left foot (improved). No joint tenderness.  CNS: Alert and oriented  Data Reviewed: Basic Metabolic Panel:  Recent Labs Lab 10/20/15 0514 10/21/15 0827 10/22/15 0511 10/23/15 0850 10/24/15 0550  NA 141 141 139 139 139  K 3.4* 3.2* 4.2 3.9 4.3  CL 108 106 105 103 102  CO2 21* 23 23 24 27   GLUCOSE 98 122* 107* 148* 92  BUN 84* 88* 98* 104* 110*  CREATININE 3.46* 3.37* 3.51* 3.40* 3.33*  CALCIUM 7.6* 7.6* 7.6* 7.8* 7.8*   Liver Function Tests: No results for input(s): AST, ALT, ALKPHOS, BILITOT, PROT, ALBUMIN in the last 168 hours. No results for input(s): LIPASE, AMYLASE in the last 168 hours. No results for input(s): AMMONIA in the last 168 hours. CBC:  Recent Labs Lab 10/19/15 0820 10/20/15 0514 10/21/15 0827  WBC 10.0 9.3 10.1  HGB 8.9* 8.2*  8.4*  HCT 28.3* 25.8* 26.6*  MCV 94.0 93.1 94.3  PLT 307 287 268   Cardiac Enzymes: No results for input(s): CKTOTAL, CKMB, CKMBINDEX, TROPONINI in the last 168 hours. BNP (last 3 results)  Recent Labs  10/14/15 1614  BNP 201.7*    ProBNP (last 3 results) No results for input(s): PROBNP in the last 8760 hours.  CBG: No results  for input(s): GLUCAP in the last 168 hours.  No results found for this or any previous visit (from the past 240 hour(s)).   Studies: No results found.  Scheduled Meds: . darbepoetin (ARANESP) injection - NON-DIALYSIS  150 mcg Subcutaneous Q Mon-1800  . furosemide  160 mg Intravenous Q6H  . hydrALAZINE  75 mg Oral TID  . labetalol  200 mg Oral BID  . lidocaine  1 patch Transdermal Q24H  . metolazone  10 mg Oral BID  . predniSONE  60 mg Oral Q breakfast  . sodium bicarbonate  650 mg Oral BID  . sodium chloride  3 mL Intravenous Q12H  . spironolactone  50 mg Oral Daily  . traMADol  50 mg Oral Once  . Vitamin D (Ergocalciferol)  50,000 Units Oral Q7 days   Continuous Infusions:     Time spent: 25 minutes    Sandy [name], [place]  [facility] Pager [phone]. If 7PM-7AM, please contact night-coverage at [email or web address], password [email or web address] 10/24/2015, 12:20 PM  LOS: 9 days

## 2015-10-24 NOTE — Progress Notes (Signed)
[name] Progress Note   Subjective: -  Around 2 liters again on max diuretic regimen  Filed Vitals:   10/23/15 0442 10/23/15 1318 10/23/15 2146 10/24/15 0541  BP: 136/96 127/78 149/96 131/83  Pulse: 82 85 72 74  Temp: 98.1 F (36.7 C) 98.4 F (36.9 C) 98.1 F (36.7 C) 98.5 F (36.9 C)  TempSrc: Oral Oral Oral Oral  Resp: _0 Height:      Weight: 110.179 kg (242 lb 14.4 oz)   109.9 kg (242 lb 4.6 oz)  SpO2: 100% 98% 100% 100%    Inpatient medications: . darbepoetin (ARANESP) injection - NON-DIALYSIS  150 mcg Subcutaneous Q Mon-1800  . furosemide  160 mg Intravenous Q6H  . hydrALAZINE  75 mg Oral TID  . labetalol  200 mg Oral BID  . lidocaine  1 patch Transdermal Q24H  . metolazone  10 mg Oral BID  . predniSONE  60 mg Oral Q breakfast  . sodium bicarbonate  650 mg Oral BID  . sodium chloride  3 mL Intravenous Q12H  . spironolactone  50 mg Oral Daily  . traMADol  50 mg Oral Once  . Vitamin D (Ergocalciferol)  50,000 Units Oral Q7 days     sodium chloride, acetaminophen, ondansetron **OR** ondansetron (ZOFRAN) IV, oxyCODONE, sodium chloride  Exam: Obese pleasant young adult WF no distress JVP 12 cm Chest occ crackles , mostly clear bilat RRR no mrg Abd obese soft ntnd striae no hsm no ascites MS no joint effusion/ deformity Ext 3+ bilat LE edema from feet to hips, 1-2+ sacral edema Neuro is alert, Ox 3 appropriate  Na 144 K 3.8 CO2 17 BUN 74 Creat 3.51 CA 7.6 eGFR 16 ml/min Alb 1.5 WBC 6.9 Hb 8.2 plt 237 Glu 86 CXR no acute UA >300 prot, no rbc/ wbc  Assessment: 1 Nephrotic syndrome / CKD 4/ FSGS bx July 2015 - sp multiple steroid/ CyA courses and recently completed IV Cytoxan per neph at [facility], however has relapsed again w anasarca and severe NS, UPC ratio 21gm here. Moved to [place] for family support for her 91 yo daughter.  Creat at baseline ~ 3.0. May be near ESRD.  D/w MD in [place] who was not planning any further Cytoxan  and was not optimistic given volume of immunosuppression patient has received w/o stabilization.  SP repeat biopsy 1/27- no results yet- have call in for report.  Trying to diurese weights coming down slowly on max regimen- IV lasix 160 mg q 6 hrs- she really feels like she wants to go back on torsemide so will change it back again, cont aldact/ metolazone. See if biopsy results would support further immunosuppressive Rx (e.g. Rituxan vs prograf).  Will need dialysis if cannot continue to get volume off or if becomes exceedingly uremic, pt aware and agreeable.  She also says that her sister has offered to get her a kidney  2 HTN on hydralazine, labetalol- should improve if we can get volume to improve - will decrease hydralazine 3 Met acidosis  po bicarb- better 4. Anemia- not helping- will give ESA - iron is OK      Josean Lycan A   10/24/2015, 12:45 PM    Recent Labs Lab 10/22/15 0511 10/23/15 0850 10/24/15 0550  NA 139 139 139  K 4.2 3.9 4.3  CL 105 103 102  CO2 _1 GLUCOSE 107* 148* 92  BUN 98* 104* 110*  CREATININE 3.51* 3.40* 3.33*  CALCIUM 7.6*  7.8* 7.8*   No results for input(s): AST, ALT, ALKPHOS, BILITOT, PROT, ALBUMIN in the last 168 hours.  Recent Labs Lab 10/19/15 0820 10/20/15 0514 10/21/15 0827  WBC 10.0 9.3 10.1  HGB 8.9* 8.2* 8.4*  HCT 28.3* 25.8* 26.6*  MCV 94.0 93.1 94.3  PLT 307 287 268

## 2015-10-25 DIAGNOSIS — N189 Chronic kidney disease, unspecified: Secondary | ICD-10-CM | POA: Insufficient documentation

## 2015-10-25 DIAGNOSIS — N184 Chronic kidney disease, stage 4 (severe): Secondary | ICD-10-CM

## 2015-10-25 DIAGNOSIS — N049 Nephrotic syndrome with unspecified morphologic changes: Secondary | ICD-10-CM

## 2015-10-25 LAB — BASIC METABOLIC PANEL
Anion gap: 12 (ref 5–15)
BUN: 109 mg/dL — AB (ref 6–20)
CHLORIDE: 101 mmol/L (ref 101–111)
CO2: 26 mmol/L (ref 22–32)
Calcium: 7.7 mg/dL — ABNORMAL LOW (ref 8.9–10.3)
Creatinine, Ser: 3.19 mg/dL — ABNORMAL HIGH (ref 0.44–1.00)
GFR calc Af Amer: 20 mL/min — ABNORMAL LOW (ref >60)
GFR, EST NON AFRICAN AMERICAN: 17 mL/min — AB (ref >60)
GLUCOSE: 92 mg/dL (ref 65–99)
POTASSIUM: 3.9 mmol/L (ref 3.5–5.1)
Sodium: 139 mmol/L (ref 135–145)

## 2015-10-25 MED ORDER — SPIRONOLACTONE 50 MG PO TABS: 50.0000 mg | ORAL_TABLET | Freq: Every day | ORAL | Status: DC

## 2015-10-25 MED ORDER — ONDANSETRON HCL 4 MG PO TABS: 4.0000 mg | ORAL_TABLET | Freq: Four times a day (QID) | ORAL | Status: DC | PRN

## 2015-10-25 MED ORDER — METOLAZONE 10 MG PO TABS
10.0000 mg | ORAL_TABLET | Freq: Two times a day (BID) | ORAL | Status: DC
Start: 2015-10-25 — End: 2016-01-22

## 2015-10-25 MED ORDER — TORSEMIDE 100 MG PO TABS: 100.0000 mg | ORAL_TABLET | Freq: Two times a day (BID) | ORAL | Status: DC

## 2015-10-25 MED ORDER — SODIUM BICARBONATE 650 MG PO TABS: 650.0000 mg | ORAL_TABLET | Freq: Two times a day (BID) | ORAL | Status: DC

## 2015-10-25 MED ORDER — LIDOCAINE 5 % EX PTCH: 1.0000 | MEDICATED_PATCH | CUTANEOUS | Status: DC

## 2015-10-25 MED ORDER — PREDNISONE 20 MG PO TABS: 60.0000 mg | ORAL_TABLET | Freq: Every day | ORAL | Status: DC

## 2015-10-25 MED ORDER — TRAMADOL HCL 50 MG PO TABS: 50.0000 mg | ORAL_TABLET | Freq: Once | ORAL | Status: DC

## 2015-10-25 MED ORDER — LABETALOL HCL 200 MG PO TABS
200.0000 mg | ORAL_TABLET | Freq: Two times a day (BID) | ORAL | Status: DC
Start: 2015-10-25 — End: 2016-01-22

## 2015-10-25 NOTE — Discharge Summary (Signed)
Physician Discharge Summary  Jolynn Bajorek AOZ:[id number] DOB: 01-02-1976 DOA: 10/14/2015  PCP: No primary care provider on file.  Admit date: 10/14/2015 Discharge date: 10/25/2015  Recommendations for Outpatient Follow-up:  1. Pt will need to follow up with nephrologist, made aware 2. Please obtain BMP to evaluate electrolytes and kidney function 3. Please also check CBC to evaluate Hg and Hct levels 4. Please note that pt was discharged on Prednisone and further tapering per nephrologist   Discharge Diagnoses:  Active Problems:   Edema   Anasarca associated with disorder of kidney   CKD (chronic kidney disease), stage IV (HCC)   Hypertension   Plantar fasciitis of left foot   Discharge Condition: Stable  Diet recommendation: Renal diet discussed in detail   Brief HPI: 40 year old female who has a history of FSGS diagnosed 2 years ago at [place], hypertension came to the hospital with worsening swelling throughout the body and fatigue. She was found to have anasarca with worsened renal function.  Assessment/Plan: Nephrotic Syndrome/CKD 4 with Anasarca - FSGS, confirmed by biopsy in [place] and was on prednisone 20 mg daily. Increased dose to 60 mg. - On Lasix GTT (dose increased to 160 mg every 6 hours). Weight has not gone down much and was changed to torsemide  - Cr continues trending down and pt wants to go home  - Left renal biopsy done 1/27. Result still pending. - nephrologist cleared for discharge on Prednisone 60 mg PO QD   Hypertension urgency - improving with blood pressure medications adjusted. (Hydralazine and labetalol.  Severe Vitamin D deficiency - Added ergocalciferol  Anemia of chronic disease - aranesp per renal  Left plantar fasciitis. - analgesia as needed  Metabolic acidosis - On bicarbonate supplement with improvement.  Hypokalemia - resolved   Morbid obesity, criteria met with BMP > 35 and underlying HTN, CKD - Body mass index  is 37.06 kg/(m^2)  Procedures/Studies: Dg Chest 2 View  10/14/2015  CLINICAL DATA:  Nephrotic syndrome, fluid retention EXAM: CHEST  2 VIEW COMPARISON:  None. FINDINGS: The heart size and mediastinal contours are within normal limits. Both lungs are clear. The visualized skeletal structures are unremarkable. IMPRESSION: No active cardiopulmonary disease. Electronically Signed   By: Skipper Cliche M.D.   On: 10/14/2015 16:43   US Biopsy  10/20/2015  INDICATION: 40 year old with history of focal segmental glomerulosclerosis. Request for renal biopsy. EXAM: ULTRASOUND-GUIDED RENAL BIOPSY MEDICATIONS: None. ANESTHESIA/SEDATION: Fentanyl 100 mcg IV; Versed 2.5 mg IV Moderate Sedation Time:  20 minutes The patient was continuously monitored during the procedure by the interventional radiology nurse under my direct supervision. FLUOROSCOPY TIME:  None COMPLICATIONS: None immediate. PROCEDURE: Informed written consent was obtained from the patient after a thorough discussion of the procedural risks, benefits and alternatives. All questions were addressed. A timeout was performed prior to the initiation of the procedure. Both kidneys were evaluated with the patient in a prone position. The left kidney was selected for biopsy. The left flank was prepped with chlorhexidine and a sterile field was created. The skin and soft tissues were anesthetized with 1% lidocaine. Using ultrasound guidance, a 16 gauge core needle was directed into the left kidney lower pole. Core biopsy was obtained. A total of 3 core biopsies were performed using a similar technique. Specimens were placed in saline. Bandage placed over the puncture site. FINDINGS: Core samples obtained from the left kidney lower pole. No significant bleeding or hematoma formation following the core biopsies. IMPRESSION: Successful ultrasound-guided core biopsies of  the left kidney lower pole. Electronically Signed   By: Markus Daft M.D.   On: 10/20/2015 13:58    Discharge Exam: Filed Vitals:   10/25/15 1030 10/25/15 1518  BP: 156/96 148/91  Pulse: 70 73  Temp:  98.2 F (36.8 C)  Resp:  20   Filed Vitals:   10/24/15 2124 10/25/15 0515 10/25/15 1030 10/25/15 1518  BP: 145/93 138/93 156/96 148/91  Pulse: 77 79 70 73  Temp: 98.3 F (36.8 C) 98.4 F (36.9 C)  98.2 F (36.8 C)  TempSrc: Oral Oral  Oral  Resp: _0 Height:      Weight:  107.366 kg (236 lb 11.2 oz)    SpO2: 99% 98%  99%    General: Pt is alert, follows commands appropriately, not in acute distress Cardiovascular: Regular rate and rhythm, S1/S2 +, no murmurs, no rubs, no gallops Respiratory: Clear to auscultation bilaterally, no wheezing, no crackles, no rhonchi Abdominal: Soft, non tender, non distended, bowel sounds +, no guarding   Discharge Instructions  Discharge Instructions    Diet - low sodium heart healthy    Complete by:  As directed      Increase activity slowly    Complete by:  As directed             Medication List    TAKE these medications        acetaminophen 500 MG tablet  Commonly known as:  TYLENOL  Take 500 mg by mouth every 6 (six) hours as needed for moderate pain.     hydrALAZINE 50 MG tablet  Commonly known as:  APRESOLINE  Take 50 mg by mouth 3 (three) times daily.     labetalol 200 MG tablet  Commonly known as:  NORMODYNE  Take 1 tablet (200 mg total) by mouth 2 (two) times daily.     lidocaine 5 %  Commonly known as:  LIDODERM  Place 1 patch onto the skin daily. Remove & Discard patch within 12 hours or as directed by MD     metolazone 10 MG tablet  Commonly known as:  ZAROXOLYN  Take 1 tablet (10 mg total) by mouth 2 (two) times daily.     ondansetron 4 MG tablet  Commonly known as:  ZOFRAN  Take 1 tablet (4 mg total) by mouth every 6 (six) hours as needed for nausea.     oxyCODONE 5 MG immediate release tablet  Commonly known as:  Oxy IR/ROXICODONE  Take 1 tablet (5 mg total) by mouth every 4 (four) hours as  needed for severe pain.     predniSONE 20 MG tablet  Commonly known as:  DELTASONE  Take 3 tablets (60 mg total) by mouth daily with breakfast.     sodium bicarbonate 650 MG tablet  Take 1 tablet (650 mg total) by mouth 2 (two) times daily.     spironolactone 50 MG tablet  Commonly known as:  ALDACTONE  Take 1 tablet (50 mg total) by mouth daily.     torsemide 100 MG tablet  Commonly known as:  DEMADEX  Take 1 tablet (100 mg total) by mouth 2 (two) times daily.     traMADol 50 MG tablet  Commonly known as:  ULTRAM  Take 1 tablet (50 mg total) by mouth once.            Follow-up Information    Follow up with Louis Meckel, MD On 10/31/2015.   Specialty:  Nephrology  Why:  appt is at 1:15 on Feb 7    Contact information:   [place] [address] [phone]       Follow up with Faye Ramsay, MD.   Specialty:  Internal Medicine   Why:  As needed call my cell phone [phone]   Contact information:   [address] [place]  [address] [phone]        The results of significant diagnostics from this hospitalization (including imaging, microbiology, ancillary and laboratory) are listed below for reference.     Microbiology: No results found for this or any previous visit (from the past 240 hour(s)).   Labs: Basic Metabolic Panel:  Recent Labs Lab 10/21/15 0827 10/22/15 0511 10/23/15 0850 10/24/15 0550 10/25/15 0551  NA 141 139 139 139 139  K 3.2* 4.2 3.9 4.3 3.9  CL 106 105 103 102 101  CO2 _0 GLUCOSE 122* 107* 148* 92 92  BUN 88* 98* 104* 110* 109*  CREATININE 3.37* 3.51* 3.40* 3.33* 3.19*  CALCIUM 7.6* 7.6* 7.8* 7.8* 7.7*   CBC:  Recent Labs Lab 10/19/15 0820 10/20/15 0514 10/21/15 0827  WBC 10.0 9.3 10.1  HGB 8.9* 8.2* 8.4*  HCT 28.3* 25.8* 26.6*  MCV 94.0 93.1 94.3  PLT 307 287 268    BNP (last 3 results)  Recent Labs  10/14/15 1614  BNP 201.7*    SIGNED: Time  coordinating discharge:  30 minutes  Faye Ramsay, MD  [facility] 10/25/2015, 4:08 PM Pager [phone]  If 7PM-7AM, please contact night-coverage [email or web address] Password [email or web address]

## 2015-10-25 NOTE — Progress Notes (Signed)
[facility] Progress Note   Subjective:  -  Around 1.7 liters on max diuretic regimen- weight is coming down but slowly -  biopsy showing effacement of foot process  But with tubulointerstitial dz - sample error of FSG ?   Filed Vitals:   10/24/15 1352 10/24/15 2124 10/25/15 0515 10/25/15 1030  BP: 140/89 145/93 138/93 156/96  Pulse: 75 77 79 70  Temp: 98.2 F (36.8 C) 98.3 F (36.8 C) 98.4 F (36.9 C)   TempSrc: Oral Oral Oral   Resp: 16 18 16    Height:      Weight:   107.366 kg (236 lb 11.2 oz)   SpO2: 99% 99% 98%     Inpatient medications: . darbepoetin (ARANESP) injection - NON-DIALYSIS  150 mcg Subcutaneous Q Mon-1800  . hydrALAZINE  50 mg Oral TID  . labetalol  200 mg Oral BID  . lidocaine  1 patch Transdermal Q24H  . metolazone  10 mg Oral BID  . predniSONE  60 mg Oral Q breakfast  . sodium bicarbonate  650 mg Oral BID  . sodium chloride  3 mL Intravenous Q12H  . spironolactone  50 mg Oral Daily  . torsemide  100 mg Oral BID  . traMADol  50 mg Oral Once  . Vitamin D (Ergocalciferol)  50,000 Units Oral Q7 days     sodium chloride, acetaminophen, ondansetron **OR** ondansetron (ZOFRAN) IV, oxyCODONE, sodium chloride  Exam: Obese pleasant young adult WF no distress JVP 12 cm Chest occ crackles , mostly clear bilat RRR no mrg Abd obese soft ntnd striae no hsm no ascites MS no joint effusion/ deformity Ext 3+ bilat LE edema from feet to hips, 1-2+ sacral edema Neuro is alert, Ox 3 appropriate  Na 144 K 3.8 CO2 17 BUN 74 Creat 3.51 CA 7.6 eGFR 16 ml/min Alb 1.5 WBC 6.9 Hb 8.2 plt 237 Glu 86 CXR no acute UA >300 prot, no rbc/ wbc  Assessment: 1 Nephrotic syndrome / CKD 4/ FSGS bx July 2015 - sp multiple steroid/ CyA /cellcept courses and recently completed IV Cytoxan per neph at [facility], however has relapsed again w anasarca and severe NS, UPC ratio 21gm here. Moved to [place] for family support for her 38 yo daughter.  Creat at baseline ~  3.0. May be near ESRD.  D/w MD in [place] who was not planning any further Cytoxan and was not optimistic given volume of immunosuppression patient has received w/o stabilization.  SP repeat biopsy 1/27- preliminary results show no FGS- that the glomeruli look OK - probably sample error but much interstitial dz-- looking at [facility] today and have call in for report.  Trying to diurese weights coming down slowly on max regimen- torsemide 100 BID also cont aldact/ metolazone. See if biopsy results would support further immunosuppressive Rx (e.g. Rituxan vs prograf).  Will need dialysis if cannot continue to get volume off or if becomes exceedingly uremic, pt aware and agreeable.  She also says that her sister has offered to get her a kidney  2 HTN on hydralazine, labetalol-  Improved with volume off- have decreased hydralazine 3 Met acidosis  po bicarb- better 4. Anemia- not helping with the anasarca - will give ESA - iron is OK  5. Dispo- I think is Ok to go home- not sure what our ultimate plan will be- will need to continue to discuss as OP      Yeilin Zweber A   10/25/2015, 2:18 PM    Recent Labs Lab  10/23/15 0850 10/24/15 0550 10/25/15 0551  NA 139 139 139  K 3.9 4.3 3.9  CL 103 102 101  CO2 24 27 26   GLUCOSE 148* 92 92  BUN 104* 110* 109*  CREATININE 3.40* 3.33* 3.19*  CALCIUM 7.8* 7.8* 7.7*   No results for input(s): AST, ALT, ALKPHOS, BILITOT, PROT, ALBUMIN in the last 168 hours.  Recent Labs Lab 10/19/15 0820 10/20/15 0514 10/21/15 0827  WBC 10.0 9.3 10.1  HGB 8.9* 8.2* 8.4*  HCT 28.3* 25.8* 26.6*  MCV 94.0 93.1 94.3  PLT 307 287 268

## 2015-10-25 NOTE — Progress Notes (Signed)
Patient is A&Ox4 and ambulatory. Discharge instructions reviewed, Questions, concerns denied

## 2015-10-25 NOTE — Discharge Instructions (Signed)
Chronic Kidney Disease Chronic kidney disease happens when the kidneys are damaged over a long period. The kidneys are two organs that do many important jobs in the body. These jobs include:  Removing wastes and extra fluids from the blood.  Making hormones that help to keep the body healthy.  Making sure that the body has the right amount of fluids and chemicals. Chronic kidney disease may be caused by many things. The kidney damage occurs slowly. If too much damage occurs, the kidneys may stop working the way that they should. This is dangerous. Treatment can help to slow down the damage and keep it from getting worse. HOME CARE  Follow your diet as told by your doctor. You may need to limit the amount of salt (sodium) and protein that you eat each day.  Take medicines only as told by your doctor. Do not take any new medicines unless your doctor approves it.  Quit smoking if you smoke. Talk to your doctor about programs that may help you quit smoking.  Have your blood pressure checked regularly and keep track of the results.  Start or keep doing an exercise plan.  Get shots (immunizations) as told by your doctor.  Take vitamins and minerals as told by your doctor.  Keep all follow-up visits as told by your doctor. This is important. GET HELP RIGHT AWAY IF:   Your symptoms get worse.  You have new symptoms.  You have symptoms of end-stage kidney disease. These include:  Headaches.  Skin that is darker or lighter than normal.  Numbness in the hands or feet.  Easy bruising.  Frequent hiccups.  Stopping of menstrual periods in women.  You have a fever.  You are making very little pee (urine).  You have pain or bleeding when you pee.   This information is not intended to replace advice given to you by your health care provider. Make sure you discuss any questions you have with your health care provider.   Document Released: 12/04/2009 Document Revised: 05/31/2015  Document Reviewed: 05/08/2012 Elsevier Interactive Patient Education 2016 Elsevier Inc.  

## 2015-10-25 NOTE — Plan of Care (Signed)
Problem: Fluid Volume: Goal: Ability to maintain a balanced intake and output will improve Outcome: Adequate for Discharge Patient was provided education on continued management of po fluid intake

## 2015-10-25 NOTE — Plan of Care (Signed)
Problem: Fluid Volume: Goal: Ability to maintain a balanced intake and output will improve Patient was provided education regarding continued management of daily fluid intake and restriction

## 2015-10-25 NOTE — Progress Notes (Signed)
Nutrition Brief Note  Patient identified for LOS (10 days).  Wt Readings from Last 15 Encounters:  10/25/15 236 lb 11.2 oz (107.366 kg)    Body mass index is 37.06 kg/(m^2). Patient meets criteria for obesity based on current BMI.   Current diet order is Heart Healthy, patient is consuming approximately 100% of meals at this time. She was consuming a low Na diet prior to admission. Labs and medications reviewed.   No nutrition interventions warranted at this time. If nutrition issues arise, please consult RD.      Jarome Matin, RD, LDN Inpatient Clinical Dietitian Pager # [phone] After hours/weekend pager # [phone]

## 2015-10-26 MED ORDER — SODIUM BICARBONATE 650 MG PO TABS: 650.0000 mg | ORAL_TABLET | Freq: Two times a day (BID) | ORAL | Status: DC

## 2015-10-26 MED ORDER — OXYCODONE HCL 5 MG PO TABS: 5.0000 mg | ORAL_TABLET | ORAL | Status: DC | PRN

## 2015-11-07 ENCOUNTER — Other Ambulatory Visit (HOSPITAL_COMMUNITY): Payer: Self-pay | Admitting: *Deleted

## 2015-11-08 ENCOUNTER — Encounter: Payer: Self-pay | Admitting: Vascular Surgery

## 2015-11-08 ENCOUNTER — Ambulatory Visit (HOSPITAL_COMMUNITY)
Admission: RE | Admit: 2015-11-08 | Discharge: 2015-11-08 | Disposition: A | Discharge status: Home / Self Care | Payer: Medicaid - Out of State | Source: Ambulatory Visit | Attending: Nephrology | Admitting: Nephrology

## 2015-11-08 DIAGNOSIS — D509 Iron deficiency anemia, unspecified: Secondary | ICD-10-CM | POA: Insufficient documentation

## 2015-11-08 MED ORDER — SODIUM CHLORIDE 0.9 % IV SOLN
510.0000 mg | INTRAVENOUS | Status: DC
  Administered 2015-11-08: 510 mg via INTRAVENOUS
  Filled 2015-11-08: qty 17

## 2015-11-08 NOTE — Discharge Instructions (Signed)

## 2015-11-10 ENCOUNTER — Ambulatory Visit: Payer: Self-pay | Admitting: Vascular Surgery

## 2015-11-14 ENCOUNTER — Other Ambulatory Visit: Payer: Self-pay

## 2015-11-14 ENCOUNTER — Ambulatory Visit (INDEPENDENT_AMBULATORY_CARE_PROVIDER_SITE_OTHER): Payer: Medicaid Other | Admitting: Vascular Surgery

## 2015-11-14 ENCOUNTER — Encounter: Payer: Self-pay | Admitting: Vascular Surgery

## 2015-11-14 VITALS — BP 131/94 | HR 84 | Ht 66.0 in | Wt 204.0 lb

## 2015-11-14 DIAGNOSIS — N184 Chronic kidney disease, stage 4 (severe): Secondary | ICD-10-CM

## 2015-11-14 NOTE — Progress Notes (Signed)
Vascular and Vein Specialist of [facility]  Patient name: Sandy [name] MRN: [id number] DOB: 07/15/1976 Sex: female  REASON FOR CONSULT: Discussion of hemodialysis access  HPI: Sandy [name] is a 40 y.o. female, who is her today to discuss hemodialysis access. She is very pleasant 40 year old with progressive renal failure. She is approaching need for hemodialysis. She has never had hemodialysis and does not have a hemodialysis catheter. Does have a long history of hypertension.  Past Medical History  Diagnosis Date  . Renal disorder   . Hypertension   . Anemia     History reviewed. No pertinent family history.  SOCIAL HISTORY: Social History   Social History  . Marital Status: Single    Spouse Name: N/A  . Number of Children: N/A  . Years of Education: N/A   Occupational History  . Not on file.   Social History Main Topics  . Smoking status: Current Some Day Smoker -- 0.25 packs/day    Types: Cigarettes  . Smokeless tobacco: Not on file  . Alcohol Use: No  . Drug Use: No  . Sexual Activity: Not on file   Other Topics Concern  . Not on file   Social History Narrative    Allergies  Allergen Reactions  . Lisinopril Cough    Current Outpatient Prescriptions  Medication Sig Dispense Refill  . acetaminophen (TYLENOL) 500 MG tablet Take 500 mg by mouth every 6 (six) hours as needed for moderate pain.    . hydrALAZINE (APRESOLINE) 50 MG tablet Take 50 mg by mouth 3 (three) times daily.    Marland Kitchen labetalol (NORMODYNE) 200 MG tablet Take 1 tablet (200 mg total) by mouth 2 (two) times daily. 60 tablet 1  . lidocaine (LIDODERM) 5 % Place 1 patch onto the skin daily. Remove & Discard patch within 12 hours or as directed by MD 30 patch 0  . metolazone (ZAROXOLYN) 10 MG tablet Take 1 tablet (10 mg total) by mouth 2 (two) times daily. 60 tablet 1  . ondansetron (ZOFRAN) 4 MG tablet Take 1 tablet (4 mg total) by mouth every 6 (six) hours as needed for nausea. 45 tablet 0  .  oxyCODONE (OXY IR/ROXICODONE) 5 MG immediate release tablet Take 1 tablet (5 mg total) by mouth every 4 (four) hours as needed for severe pain. 45 tablet 0  . predniSONE (DELTASONE) 20 MG tablet Take 3 tablets (60 mg total) by mouth daily with breakfast. 90 tablet 0  . sodium bicarbonate 650 MG tablet Take 1 tablet (650 mg total) by mouth 2 (two) times daily. 60 tablet 1  . spironolactone (ALDACTONE) 50 MG tablet Take 1 tablet (50 mg total) by mouth daily. 30 tablet 1  . torsemide (DEMADEX) 100 MG tablet Take 1 tablet (100 mg total) by mouth 2 (two) times daily. 60 tablet 0  . traMADol (ULTRAM) 50 MG tablet Take 1 tablet (50 mg total) by mouth once. 30 tablet 0   No current facility-administered medications for this visit.    REVIEW OF SYSTEMS:  [X]  denotes positive finding, [ ]  denotes negative finding Cardiac  Comments:  Chest pain or chest pressure:    Shortness of breath upon exertion: x   Short of breath when lying flat:    Irregular heart rhythm:        Vascular    Pain in calf, thigh, or hip brought on by ambulation: x   Pain in feet at night that wakes you up from your sleep:  Blood clot in your veins:    Leg swelling:  x       Pulmonary    Oxygen at home:    Productive cough:     Wheezing:         Neurologic    Sudden weakness in arms or legs:     Sudden numbness in arms or legs:     Sudden onset of difficulty speaking or slurred speech:    Temporary loss of vision in one eye:     Problems with dizziness:         Gastrointestinal    Blood in stool:     Vomited blood:         Genitourinary    Burning when urinating:     Blood in urine:        Psychiatric    Major depression:         Hematologic    Bleeding problems:    Problems with blood clotting too easily:        Skin    Rashes or ulcers:        Constitutional    Fever or chills:      PHYSICAL EXAM: Filed Vitals:   11/14/15 1225  BP: 131/94  Pulse: 84  Height: 5\' 6"  (1.676 m)  Weight: 204  lb (92.534 kg)  SpO2: 98%    GENERAL: The patient is a well-nourished female, in no acute distress. The vital signs are documented above. CARDIAC: There is a regular rate and rhythm.  VASCULAR: 2+ radial pulses bilaterally. Palpable cephalic veins at the wrist bilaterally PULMONARY: There is good air exchange  MUSCULOSKELETAL: There are no major deformities or cyanosis. NEUROLOGIC: No focal weakness or paresthesias are detected. SKIN: There are no ulcers or rashes noted. PSYCHIATRIC: The patient has a normal affect.  DATA:  Her venous mapping from [facility] hospital. This shows a patent veins bilaterally  I reimage to both arms with SonoSite ultrasound. This does show a good caliber cephalic vein at the left wrist and the larger at the antecubital space in her upper arm.     MEDICAL ISSUES: I discussed options including AV fistula AV graft and hemodialysis catheter. I feel that she would be a good candidate for AV fistula creation. She is right-handed. We will schedule her for left AV fistula creation   Xiara Knisley [facility] of [employer]: [phone]

## 2015-11-16 ENCOUNTER — Ambulatory Visit (HOSPITAL_COMMUNITY): Payer: Medicaid - Out of State

## 2015-11-20 ENCOUNTER — Ambulatory Visit (HOSPITAL_COMMUNITY): Payer: Medicaid - Out of State | Attending: Nephrology

## 2015-11-22 ENCOUNTER — Encounter (HOSPITAL_COMMUNITY): Payer: Self-pay

## 2015-11-22 ENCOUNTER — Other Ambulatory Visit (HOSPITAL_COMMUNITY): Payer: Self-pay | Admitting: *Deleted

## 2015-11-22 ENCOUNTER — Encounter (HOSPITAL_COMMUNITY)
Admission: RE | Admit: 2015-11-22 | Discharge: 2015-11-22 | Disposition: A | Discharge status: Home / Self Care | Payer: Medicaid Other | Source: Ambulatory Visit | Attending: Vascular Surgery | Admitting: Vascular Surgery

## 2015-11-22 DIAGNOSIS — N186 End stage renal disease: Secondary | ICD-10-CM | POA: Insufficient documentation

## 2015-11-22 DIAGNOSIS — Z01812 Encounter for preprocedural laboratory examination: Secondary | ICD-10-CM | POA: Insufficient documentation

## 2015-11-22 HISTORY — DX: Nephrotic syndrome with unspecified morphologic changes: N04.9

## 2015-11-22 LAB — HCG, SERUM, QUALITATIVE: PREG SERUM: NEGATIVE

## 2015-11-22 LAB — BASIC METABOLIC PANEL
Anion gap: 11 (ref 5–15)
BUN: 55 mg/dL — AB (ref 6–20)
CALCIUM: 8 mg/dL — AB (ref 8.9–10.3)
CO2: 22 mmol/L (ref 22–32)
Chloride: 110 mmol/L (ref 101–111)
Creatinine, Ser: 3.07 mg/dL — ABNORMAL HIGH (ref 0.44–1.00)
GFR calc Af Amer: 21 mL/min — ABNORMAL LOW (ref >60)
GFR, EST NON AFRICAN AMERICAN: 18 mL/min — AB (ref >60)
GLUCOSE: 121 mg/dL — AB (ref 65–99)
Potassium: 4.5 mmol/L (ref 3.5–5.1)
Sodium: 143 mmol/L (ref 135–145)

## 2015-11-22 LAB — CBC
HCT: 32.3 % — ABNORMAL LOW (ref 36.0–46.0)
Hemoglobin: 9.9 g/dL — ABNORMAL LOW (ref 12.0–15.0)
MCH: 29.4 pg (ref 26.0–34.0)
MCHC: 30.7 g/dL (ref 30.0–36.0)
MCV: 95.8 fL (ref 78.0–100.0)
PLATELETS: 423 10*3/uL — AB (ref 150–400)
RBC: 3.37 MIL/uL — ABNORMAL LOW (ref 3.87–5.11)
RDW: 16.4 % — AB (ref 11.5–15.5)
WBC: 15.8 10*3/uL — ABNORMAL HIGH (ref 4.0–10.5)

## 2015-11-22 NOTE — Pre-Procedure Instructions (Signed)
Sandy [name]  11/22/2015      Your procedure is scheduled on Friday, November 24, 2015 at 10:15 AM.   Report to [facility] Entrance "A" Admitting Office at 8:15 AM.   Call this number if you have problems the morning of surgery: [phone]   Any questions prior to day of surgery, please call [phone] between 8 & 4 PM.   Remember:  Do not eat food or drink liquids after midnight Thursday, 11/23/15.   Take these medicines the morning of surgery with A SIP OF WATER: Hydralazine (Apresoline), Labetalol (Normodyne), Tylenol - if needed   Do not wear jewelry, make-up or nail polish.  Do not wear lotions, powders, or perfumes.  You may NOT wear deodorant.  Do not shave 48 hours prior to surgery.   Do not bring valuables to the hospital.  [facility] is not responsible for any belongings or valuables.  Contacts, dentures or bridgework may not be worn into surgery.  Leave your suitcase in the car.  After surgery it may be brought to your room.  For patients admitted to the hospital, discharge time will be determined by your treatment team.  Patients discharged the day of surgery will not be allowed to drive home.   Special instructions:  [place] - Preparing for Surgery  Before surgery, you can play an important role.  Because skin is not sterile, your skin needs to be as free of germs as possible.  You can reduce the number of germs on you skin by washing with CHG (chlorahexidine gluconate) soap before surgery.  CHG is an antiseptic cleaner which kills germs and bonds with the skin to continue killing germs even after washing.  Please DO NOT use if you have an allergy to CHG or antibacterial soaps.  If your skin becomes reddened/irritated stop using the CHG and inform your nurse when you arrive at Short Stay.  Do not shave (including legs and underarms) for at least 48 hours prior to the first CHG shower.  You may shave your face.  Please follow these instructions  carefully:   1.  Shower with CHG Soap the night before surgery and the                                morning of Surgery.  2.  If you choose to wash your hair, wash your hair first as usual with your       normal shampoo.  3.  After you shampoo, rinse your hair and body thoroughly to remove the                      Shampoo.  4.  Use CHG as you would any other liquid soap.  You can apply chg directly       to the skin and wash gently with scrungie or a clean washcloth.  5.  Apply the CHG Soap to your body ONLY FROM THE NECK DOWN.        Do not use on open wounds or open sores.  Avoid contact with your eyes, ears, mouth and genitals (private parts).  Wash genitals (private parts) with your normal soap.  6.  Wash thoroughly, paying special attention to the area where your surgery        will be performed.  7.  Thoroughly rinse your body with warm water from the neck down.  8.  DO NOT shower/wash with your normal soap after using and rinsing off       the CHG Soap.  9.  Pat yourself dry with a clean towel.            10.  Wear clean pajamas.            11.  Place clean sheets on your bed the night of your first shower and do not        sleep with pets.  Day of Surgery  Do not apply any lotions/deodorants the morning of surgery.  Please wear clean clothes to the hospital.   Please read over the following fact sheets that you were given. Pain Booklet, Coughing and Deep Breathing and Surgical Site Infection Prevention

## 2015-11-22 NOTE — Progress Notes (Signed)
Pt denies cardiac history, chest pain or sob. 

## 2015-11-23 MED ORDER — CEFUROXIME SODIUM 1.5 G IJ SOLR
1.5000 g | INTRAMUSCULAR | Status: AC
  Administered 2015-11-24: 1.5 g via INTRAVENOUS
  Filled 2015-11-23: qty 1.5

## 2015-11-23 MED ORDER — SODIUM CHLORIDE 0.9 % IV SOLN
INTRAVENOUS | Status: DC
  Administered 2015-11-24: 09:00:00 via INTRAVENOUS

## 2015-11-24 ENCOUNTER — Ambulatory Visit (HOSPITAL_COMMUNITY): Payer: Medicaid Other | Admitting: Anesthesiology

## 2015-11-24 ENCOUNTER — Encounter (HOSPITAL_COMMUNITY)
Admission: RE | Disposition: A | Discharge status: Home / Self Care | Payer: Self-pay | Source: Ambulatory Visit | Attending: Vascular Surgery

## 2015-11-24 ENCOUNTER — Ambulatory Visit (HOSPITAL_COMMUNITY)
Admission: RE | Admit: 2015-11-24 | Discharge: 2015-11-24 | Disposition: A | Discharge status: Home / Self Care | Payer: Medicaid Other | Source: Ambulatory Visit | Attending: Vascular Surgery | Admitting: Vascular Surgery

## 2015-11-24 ENCOUNTER — Other Ambulatory Visit: Payer: Self-pay | Admitting: *Deleted

## 2015-11-24 ENCOUNTER — Encounter (HOSPITAL_COMMUNITY): Payer: Self-pay | Admitting: *Deleted

## 2015-11-24 DIAGNOSIS — Z79899 Other long term (current) drug therapy: Secondary | ICD-10-CM | POA: Diagnosis not present

## 2015-11-24 DIAGNOSIS — I120 Hypertensive chronic kidney disease with stage 5 chronic kidney disease or end stage renal disease: Secondary | ICD-10-CM | POA: Insufficient documentation

## 2015-11-24 DIAGNOSIS — F17210 Nicotine dependence, cigarettes, uncomplicated: Secondary | ICD-10-CM | POA: Diagnosis not present

## 2015-11-24 DIAGNOSIS — N186 End stage renal disease: Secondary | ICD-10-CM | POA: Insufficient documentation

## 2015-11-24 DIAGNOSIS — Z4931 Encounter for adequacy testing for hemodialysis: Secondary | ICD-10-CM

## 2015-11-24 DIAGNOSIS — N185 Chronic kidney disease, stage 5: Secondary | ICD-10-CM | POA: Diagnosis not present

## 2015-11-24 DIAGNOSIS — Z7952 Long term (current) use of systemic steroids: Secondary | ICD-10-CM | POA: Insufficient documentation

## 2015-11-24 DIAGNOSIS — D649 Anemia, unspecified: Secondary | ICD-10-CM | POA: Insufficient documentation

## 2015-11-24 HISTORY — PX: AV FISTULA PLACEMENT: SHX1204

## 2015-11-24 LAB — POCT I-STAT 4, (NA,K, GLUC, HGB,HCT)
GLUCOSE: 90 mg/dL (ref 65–99)
HCT: 28 % — ABNORMAL LOW (ref 36.0–46.0)
Hemoglobin: 9.5 g/dL — ABNORMAL LOW (ref 12.0–15.0)
POTASSIUM: 3.6 mmol/L (ref 3.5–5.1)
Sodium: 143 mmol/L (ref 135–145)

## 2015-11-24 SURGERY — ARTERIOVENOUS (AV) FISTULA CREATION
Anesthesia: Monitor Anesthesia Care | Site: Arm Upper | Laterality: Left

## 2015-11-24 MED ORDER — PROPOFOL 500 MG/50ML IV EMUL
INTRAVENOUS | Status: DC | PRN
  Administered 2015-11-24: 49 ug/kg/min via INTRAVENOUS

## 2015-11-24 MED ORDER — PROMETHAZINE HCL 25 MG/ML IJ SOLN: 6.2500 mg | INTRAMUSCULAR | Status: DC | PRN

## 2015-11-24 MED ORDER — 0.9 % SODIUM CHLORIDE (POUR BTL) OPTIME
TOPICAL | Status: DC | PRN
  Administered 2015-11-24: 1000 mL

## 2015-11-24 MED ORDER — PROPOFOL 10 MG/ML IV BOLUS
INTRAVENOUS | Status: DC | PRN
  Administered 2015-11-24: 50 mg via INTRAVENOUS

## 2015-11-24 MED ORDER — LIDOCAINE-EPINEPHRINE 0.5 %-1:200000 IJ SOLN
INTRAMUSCULAR | Status: DC | PRN
  Administered 2015-11-24: 50 mL

## 2015-11-24 MED ORDER — OXYCODONE HCL 5 MG PO TABS
ORAL_TABLET | ORAL | Status: AC
  Filled 2015-11-24: qty 1

## 2015-11-24 MED ORDER — MIDAZOLAM HCL 2 MG/2ML IJ SOLN
INTRAMUSCULAR | Status: AC
  Filled 2015-11-24: qty 2

## 2015-11-24 MED ORDER — FENTANYL CITRATE (PF) 250 MCG/5ML IJ SOLN
INTRAMUSCULAR | Status: AC
  Filled 2015-11-24: qty 5

## 2015-11-24 MED ORDER — ONDANSETRON HCL 4 MG/2ML IJ SOLN
INTRAMUSCULAR | Status: DC | PRN
  Administered 2015-11-24: 4 mg via INTRAVENOUS

## 2015-11-24 MED ORDER — LIDOCAINE HCL (CARDIAC) 20 MG/ML IV SOLN
INTRAVENOUS | Status: AC
  Filled 2015-11-24: qty 5

## 2015-11-24 MED ORDER — FENTANYL CITRATE (PF) 100 MCG/2ML IJ SOLN: 25.0000 ug | INTRAMUSCULAR | Status: DC | PRN

## 2015-11-24 MED ORDER — SODIUM CHLORIDE 0.9 % IV SOLN
INTRAVENOUS | Status: DC | PRN
  Administered 2015-11-24: 11:00:00

## 2015-11-24 MED ORDER — OXYCODONE HCL 5 MG PO TABS: 5.0000 mg | ORAL_TABLET | ORAL | Status: DC | PRN

## 2015-11-24 MED ORDER — MIDAZOLAM HCL 5 MG/5ML IJ SOLN
INTRAMUSCULAR | Status: DC | PRN
  Administered 2015-11-24: 2 mg via INTRAVENOUS

## 2015-11-24 MED ORDER — LIDOCAINE HCL (CARDIAC) 20 MG/ML IV SOLN
INTRAVENOUS | Status: DC | PRN
  Administered 2015-11-24: 100 mg via INTRATRACHEAL

## 2015-11-24 MED ORDER — PROPOFOL 10 MG/ML IV BOLUS
INTRAVENOUS | Status: AC
  Filled 2015-11-24: qty 20

## 2015-11-24 MED ORDER — LIDOCAINE-EPINEPHRINE 0.5 %-1:200000 IJ SOLN
INTRAMUSCULAR | Status: AC
  Filled 2015-11-24: qty 1

## 2015-11-24 MED ORDER — CHLORHEXIDINE GLUCONATE CLOTH 2 % EX PADS: 6.0000 | MEDICATED_PAD | Freq: Once | CUTANEOUS | Status: DC

## 2015-11-24 MED ORDER — OXYCODONE HCL 5 MG/5ML PO SOLN: 5.0000 mg | Freq: Once | ORAL | Status: AC | PRN

## 2015-11-24 MED ORDER — OXYCODONE HCL 5 MG PO TABS
5.0000 mg | ORAL_TABLET | Freq: Once | ORAL | Status: AC | PRN
  Administered 2015-11-24: 5 mg via ORAL

## 2015-11-24 MED ORDER — SODIUM CHLORIDE 0.9 % IV SOLN
INTRAVENOUS | Status: DC | PRN
  Administered 2015-11-24 (×2): via INTRAVENOUS

## 2015-11-24 SURGICAL SUPPLY — 39 items
ARMBAND PINK RESTRICT EXTREMIT (MISCELLANEOUS) ×3 IMPLANT
BENZOIN TINCTURE PRP APPL 2/3 (GAUZE/BANDAGES/DRESSINGS) ×3 IMPLANT
CANISTER SUCTION 2500CC (MISCELLANEOUS) ×3 IMPLANT
CANNULA VESSEL 3MM 2 BLNT TIP (CANNULA) ×3 IMPLANT
CLIP LIGATING EXTRA MED SLVR (CLIP) ×3 IMPLANT
CLIP LIGATING EXTRA SM BLUE (MISCELLANEOUS) ×3 IMPLANT
CLOSURE WOUND 1/2 X4 (GAUZE/BANDAGES/DRESSINGS) ×1
COVER PROBE W GEL 5X96 (DRAPES) ×3 IMPLANT
DECANTER SPIKE VIAL GLASS SM (MISCELLANEOUS) ×3 IMPLANT
ELECT REM PT RETURN 9FT ADLT (ELECTROSURGICAL) ×3
ELECTRODE REM PT RTRN 9FT ADLT (ELECTROSURGICAL) ×1 IMPLANT
GAUZE SPONGE 4X4 12PLY STRL (GAUZE/BANDAGES/DRESSINGS) ×3 IMPLANT
GEL ULTRASOUND 20GR AQUASONIC (MISCELLANEOUS) IMPLANT
GLOVE BIO SURGEON STRL SZ 6.5 (GLOVE) ×4 IMPLANT
GLOVE BIO SURGEONS STRL SZ 6.5 (GLOVE) ×2
GLOVE BIOGEL PI IND STRL 6.5 (GLOVE) ×1 IMPLANT
GLOVE BIOGEL PI IND STRL 7.0 (GLOVE) ×3 IMPLANT
GLOVE BIOGEL PI IND STRL 7.5 (GLOVE) ×1 IMPLANT
GLOVE BIOGEL PI INDICATOR 6.5 (GLOVE) ×2
GLOVE BIOGEL PI INDICATOR 7.0 (GLOVE) ×6
GLOVE BIOGEL PI INDICATOR 7.5 (GLOVE) ×2
GLOVE SS BIOGEL STRL SZ 7.5 (GLOVE) ×1 IMPLANT
GLOVE SUPERSENSE BIOGEL SZ 7.5 (GLOVE) ×2
GLOVE SURG SS PI 6.5 STRL IVOR (GLOVE) ×9 IMPLANT
GOWN STRL REUS W/ TWL LRG LVL3 (GOWN DISPOSABLE) ×5 IMPLANT
GOWN STRL REUS W/TWL LRG LVL3 (GOWN DISPOSABLE) ×10
KIT BASIN OR (CUSTOM PROCEDURE TRAY) ×3 IMPLANT
KIT ROOM TURNOVER OR (KITS) ×3 IMPLANT
NS IRRIG 1000ML POUR BTL (IV SOLUTION) ×3 IMPLANT
PACK CV ACCESS (CUSTOM PROCEDURE TRAY) ×3 IMPLANT
PAD ARMBOARD 7.5X6 YLW CONV (MISCELLANEOUS) ×6 IMPLANT
SPONGE GAUZE 4X4 12PLY STER LF (GAUZE/BANDAGES/DRESSINGS) ×3 IMPLANT
STRIP CLOSURE SKIN 1/2X4 (GAUZE/BANDAGES/DRESSINGS) ×2 IMPLANT
SUT PROLENE 6 0 CC (SUTURE) ×3 IMPLANT
SUT VIC AB 3-0 SH 27 (SUTURE) ×2
SUT VIC AB 3-0 SH 27X BRD (SUTURE) ×1 IMPLANT
TAPE CLOTH SURG 4X10 WHT LF (GAUZE/BANDAGES/DRESSINGS) ×3 IMPLANT
UNDERPAD 30X30 INCONTINENT (UNDERPADS AND DIAPERS) ×3 IMPLANT
WATER STERILE IRR 1000ML POUR (IV SOLUTION) ×3 IMPLANT

## 2015-11-24 NOTE — Op Note (Signed)
    OPERATIVE REPORT  DATE OF SURGERY: 11/24/2015  PATIENT: Sandy [name], 40 y.o. female MRN: [id number]  DOB: [birth year]  PRE-OPERATIVE DIAGNOSIS: Chronic renal insufficiency  POST-OPERATIVE DIAGNOSIS:  Same  PROCEDURE: Left upper arm AV fistula creation  SURGEON:  Curt Jews, M.D.  PHYSICIAN ASSISTANT: Samantha Rhyne PA-C  ANESTHESIA:  Local with sedation  EBL: Minimal ml  Total I/O In: 900 [P.O.:250; I.V.:650] Out: 15 [Blood:15]  BLOOD ADMINISTERED: None  DRAINS: None  SPECIMEN: None  COUNTS CORRECT:  YES  PLAN OF CARE: PACU   PATIENT DISPOSITION:  PACU - hemodynamically stable  PROCEDURE DETAILS: Patient was taken to the operating placed supine position where the area of the left arm was prepped and draped in sterile fashion. SonoSite ultrasound was used to visualize the cephalic vein which was of good caliber at the antecubital space. The cephalic vein was of good caliber and the proximal forearm but in the midforearm branch and was small at the wrist. There is also markedly diminished radial pulse at the wrist.  Using local anesthesia incision was made at the level of the antecubital space and was carried down to isolate the cephalic vein. The vein was of good size and was mobilized proximally and distally. Tributary branches were ligated with 301 4-0 silk ties and divided. The vein was ligated distally and divided. The vein was brought into approximation with the brachial artery. Brachial artery was exposed the same incision. The artery was occluded proximally and distally and was opened with an 11 blade and sent longitudinally with Potts scissors. The cephalic vein was cut to appropriate length and was spatulated and sewn end-to-side to the artery with a running 6-0 suture. Prior to completion of the anastomosis the usual flushing maneuvers were undertaken. Anastomosis was then completed and clamps removed with excellent thrill noted in the fistula. Had good Doppler flow  at the ulnar artery and lesser flow at the radial artery. This was present with and without compression of the fistula. The wounds were irrigated with saline. Hemostasis tablet cautery. The wound was closed with 3-0 Vicryl in the subcutaneous and subcuticular tissue. Benzoin insertion for applied   Curt Jews, M.D. 11/24/2015 2:11 PM

## 2015-11-24 NOTE — Interval H&P Note (Signed)
History and Physical Interval Note:  11/24/2015 10:05 AM  Sandy [name]  has presented today for surgery, with the diagnosis of End Stage Renal Disease  N18.6   The various methods of treatment have been discussed with the patient and family. After consideration of risks, benefits and other options for treatment, the patient has consented to  Procedure(s): ARTERIOVENOUS (AV) FISTULA CREATION-LEFT ARM (Left) as a surgical intervention .  The patient's history has been reviewed, patient examined, no change in status, stable for surgery.  I have reviewed the patient's chart and labs.  Questions were answered to the patient's satisfaction.     Curt Jews

## 2015-11-24 NOTE — Anesthesia Preprocedure Evaluation (Addendum)
Anesthesia Evaluation  Patient identified by MRN, date of birth, ID band Patient awake    Reviewed: Allergy & Precautions, H&P , NPO status , Patient's Chart, lab work & pertinent test results  History of Anesthesia Complications Negative for: history of anesthetic complications  Airway Mallampati: III  TM Distance: >3 FB Neck ROM: full    Dental no notable dental hx. (+) Teeth Intact   Pulmonary Current Smoker,   Does have a cough, afebrile, feels like herself   + decreased breath sounds      Cardiovascular hypertension, Pt. on medications Normal cardiovascular exam Rhythm:regular Rate:Normal     Neuro/Psych negative neurological ROS     GI/Hepatic negative GI ROS, Neg liver ROS,   Endo/Other  negative endocrine ROS  Renal/GU ESRFRenal diseaseFSGS     Musculoskeletal   Abdominal   Peds  Hematology  (+) anemia ,   Anesthesia Other Findings   Reproductive/Obstetrics negative OB ROS                            Anesthesia Physical Anesthesia Plan  ASA: IV  Anesthesia Plan: MAC   Post-op Pain Management:    Induction: Intravenous  Airway Management Planned: Simple Face Mask  Additional Equipment:   Intra-op Plan:   Post-operative Plan:   Informed Consent: I have reviewed the patients History and Physical, chart, labs and discussed the procedure including the risks, benefits and alternatives for the proposed anesthesia with the patient or authorized representative who has indicated his/her understanding and acceptance.   Dental Advisory Given  Plan Discussed with: Anesthesiologist, CRNA and Surgeon  Anesthesia Plan Comments: (Plan for MAC She did not take her usual 60mg  prednisone today but given low stress of surgery with mac and local will opt to hold on supplemental stress dose steroids and she will take her usual dose after surgery)       Anesthesia Quick  Evaluation

## 2015-11-24 NOTE — Discharge Instructions (Signed)
° ° °  11/24/2015 Sandy [name] [id number] 06/20/1976  Surgeon(s): Rosetta Posner, MD  Procedure(s): ARTERIOVENOUS (AV) FISTULA CREATION-LEFT UPPER ARM  x Do not stick fistula for 12 weeks

## 2015-11-24 NOTE — Progress Notes (Signed)
Called Dr. Jillyn Hidden for sign out. He was okay to transfer pt to phase 2 at this time.

## 2015-11-24 NOTE — Anesthesia Postprocedure Evaluation (Signed)
Anesthesia Post Note  Patient: Sandy [name]  Procedure(s) Performed: Procedure(s) (LRB): ARTERIOVENOUS (AV) FISTULA CREATION-LEFT UPPER ARM (Left)  Patient location during evaluation: PACU Anesthesia Type: MAC Level of consciousness: awake and alert Pain management: pain level controlled Vital Signs Assessment: post-procedure vital signs reviewed and stable Respiratory status: spontaneous breathing, nonlabored ventilation, respiratory function stable and patient connected to nasal cannula oxygen Cardiovascular status: stable and blood pressure returned to baseline Anesthetic complications: no    Last Vitals:  Filed Vitals:   11/24/15 1300 11/24/15 1315  BP:  155/103  Pulse:  102  Temp: 37.1 C   Resp:      Last Pain:  Filed Vitals:   11/24/15 1320  PainSc: 5                  Zenaida Deed

## 2015-11-24 NOTE — Transfer of Care (Signed)
Immediate Anesthesia Transfer of Care Note  Patient: Sandy [name]  Procedure(s) Performed: Procedure(s): ARTERIOVENOUS (AV) FISTULA CREATION-LEFT UPPER ARM (Left)  Patient Location: PACU  Anesthesia Type:MAC  Level of Consciousness: awake, alert , oriented, patient cooperative and responds to stimulation  Airway & Oxygen Therapy: Patient Spontanous Breathing and Patient connected to face mask oxygen  Post-op Assessment: Report given to RN, Post -op Vital signs reviewed and stable and Patient moving all extremities X 4  Post vital signs: Reviewed and stable  Last Vitals:  Filed Vitals:   11/24/15 0911  BP: 138/79  Pulse: 93  Temp: 0000000 C    Complications: No apparent anesthesia complications

## 2015-11-24 NOTE — H&P (View-Only) (Signed)
[facility]  Patient name: Sandy [name] MRN: [id number] DOB: 08-Aug-1976 Sex: female  REASON FOR CONSULT: Discussion of hemodialysis access  HPI: Sandy [name] is a 40 y.o. female, who is her today to discuss hemodialysis access. She is very pleasant 40 year old with progressive renal failure. She is approaching need for hemodialysis. She has never had hemodialysis and does not have a hemodialysis catheter. Does have a long history of hypertension.  Past Medical History  Diagnosis Date  . Renal disorder   . Hypertension   . Anemia     History reviewed. No pertinent family history.  SOCIAL HISTORY: Social History   Social History  . Marital Status: Single    Spouse Name: N/A  . Number of Children: N/A  . Years of Education: N/A   Occupational History  . Not on file.   Social History Main Topics  . Smoking status: Current Some Day Smoker -- 0.25 packs/day    Types: Cigarettes  . Smokeless tobacco: Not on file  . Alcohol Use: No  . Drug Use: No  . Sexual Activity: Not on file   Other Topics Concern  . Not on file   Social History Narrative    Allergies  Allergen Reactions  . Lisinopril Cough    Current Outpatient Prescriptions  Medication Sig Dispense Refill  . acetaminophen (TYLENOL) 500 MG tablet Take 500 mg by mouth every 6 (six) hours as needed for moderate pain.    . hydrALAZINE (APRESOLINE) 50 MG tablet Take 50 mg by mouth 3 (three) times daily.    Marland Kitchen labetalol (NORMODYNE) 200 MG tablet Take 1 tablet (200 mg total) by mouth 2 (two) times daily. 60 tablet 1  . lidocaine (LIDODERM) 5 % Place 1 patch onto the skin daily. Remove & Discard patch within 12 hours or as directed by MD 30 patch 0  . metolazone (ZAROXOLYN) 10 MG tablet Take 1 tablet (10 mg total) by mouth 2 (two) times daily. 60 tablet 1  . ondansetron (ZOFRAN) 4 MG tablet Take 1 tablet (4 mg total) by mouth every 6 (six) hours as needed for nausea. 45 tablet 0  .  oxyCODONE (OXY IR/ROXICODONE) 5 MG immediate release tablet Take 1 tablet (5 mg total) by mouth every 4 (four) hours as needed for severe pain. 45 tablet 0  . predniSONE (DELTASONE) 20 MG tablet Take 3 tablets (60 mg total) by mouth daily with breakfast. 90 tablet 0  . sodium bicarbonate 650 MG tablet Take 1 tablet (650 mg total) by mouth 2 (two) times daily. 60 tablet 1  . spironolactone (ALDACTONE) 50 MG tablet Take 1 tablet (50 mg total) by mouth daily. 30 tablet 1  . torsemide (DEMADEX) 100 MG tablet Take 1 tablet (100 mg total) by mouth 2 (two) times daily. 60 tablet 0  . traMADol (ULTRAM) 50 MG tablet Take 1 tablet (50 mg total) by mouth once. 30 tablet 0   No current facility-administered medications for this visit.    REVIEW OF SYSTEMS:  [X]  denotes positive finding, [ ]  denotes negative finding Cardiac  Comments:  Chest pain or chest pressure:    Shortness of breath upon exertion: x   Short of breath when lying flat:    Irregular heart rhythm:        Vascular    Pain in calf, thigh, or hip brought on by ambulation: x   Pain in feet at night that wakes you up from your sleep:  Blood clot in your veins:    Leg swelling:  x       Pulmonary    Oxygen at home:    Productive cough:     Wheezing:         Neurologic    Sudden weakness in arms or legs:     Sudden numbness in arms or legs:     Sudden onset of difficulty speaking or slurred speech:    Temporary loss of vision in one eye:     Problems with dizziness:         Gastrointestinal    Blood in stool:     Vomited blood:         Genitourinary    Burning when urinating:     Blood in urine:        Psychiatric    Major depression:         Hematologic    Bleeding problems:    Problems with blood clotting too easily:        Skin    Rashes or ulcers:        Constitutional    Fever or chills:      PHYSICAL EXAM: Filed Vitals:   11/14/15 1225  BP: 131/94  Pulse: 84  Height: 5\' 6"  (1.676 m)  Weight: 204  lb (92.534 kg)  SpO2: 98%    GENERAL: The patient is a well-nourished female, in no acute distress. The vital signs are documented above. CARDIAC: There is a regular rate and rhythm.  VASCULAR: 2+ radial pulses bilaterally. Palpable cephalic veins at the wrist bilaterally PULMONARY: There is good air exchange  MUSCULOSKELETAL: There are no major deformities or cyanosis. NEUROLOGIC: No focal weakness or paresthesias are detected. SKIN: There are no ulcers or rashes noted. PSYCHIATRIC: The patient has a normal affect.  DATA:  Her venous mapping from [facility] hospital. This shows a patent veins bilaterally  I reimage to both arms with SonoSite ultrasound. This does show a good caliber cephalic vein at the left wrist and the larger at the antecubital space in her upper arm.     MEDICAL ISSUES: I discussed options including AV fistula AV graft and hemodialysis catheter. I feel that she would be a good candidate for AV fistula creation. She is right-handed. We will schedule her for left AV fistula creation   [name] [facility] [employer]: [phone]

## 2015-11-24 NOTE — Anesthesia Procedure Notes (Signed)
Procedure Name: MAC Date/Time: 11/24/2015 10:50 AM Performed by: Tressia Miners LEFFEW Pre-anesthesia Checklist: Patient identified, Emergency Drugs available, Suction available, Patient being monitored and Timeout performed Patient Re-evaluated:Patient Re-evaluated prior to inductionOxygen Delivery Method: Simple face mask

## 2015-11-27 ENCOUNTER — Encounter (HOSPITAL_COMMUNITY): Payer: Self-pay | Admitting: Vascular Surgery

## 2015-11-28 ENCOUNTER — Ambulatory Visit: Payer: Medicaid - Out of State | Admitting: Family

## 2015-11-28 ENCOUNTER — Telehealth: Payer: Self-pay | Admitting: Vascular Surgery

## 2015-11-28 ENCOUNTER — Telehealth: Payer: Self-pay | Admitting: *Deleted

## 2015-11-28 NOTE — Telephone Encounter (Signed)
-----   Message from Mena Goes, RN sent at 11/24/2015  1:10 PM EST ----- Regarding: schedule   ----- Message -----    From: Gabriel Earing, PA-C    Sent: 11/24/2015  11:41 AM      To: Vvs Charge Pool  S/p left BC AVF 11/24/15.  F/u with Dr. Donnetta Hutching in 4 weeks with duplex.  Thanks, Aldona Bar

## 2015-11-28 NOTE — Telephone Encounter (Signed)
Patient called in to report extreme pain in left elbow since her AVF creation on 11-24-15 by Dr. Donnetta Hutching. She reports no swelling in the hand or forearm.She is able to grasp objects with no pain.  She has no drainage and she is afebrile. I reassured patient but she is insistent that she be seen today" because I don't want to run out of my pain medication, I only have 5 left". She was discharged with rx for Oxycodone IR 5 mg # 30 on 11-24-15. Per Database, she has had only 1 other script for Oxycodone IR 5 mg from when she was discharged for anasarca and Stage IV CKD on 10-25-15.  I went over different interventions but she wants an appt to be seen. She will see our NP today.

## 2015-11-28 NOTE — Telephone Encounter (Signed)
LM for pt re appt, dpm °

## 2015-11-29 ENCOUNTER — Encounter: Payer: Self-pay | Admitting: Family

## 2015-11-29 ENCOUNTER — Ambulatory Visit (INDEPENDENT_AMBULATORY_CARE_PROVIDER_SITE_OTHER): Payer: Self-pay | Admitting: Family

## 2015-11-29 VITALS — BP 135/92 | HR 93 | Temp 98.3°F | Ht 66.0 in | Wt 210.2 lb

## 2015-11-29 DIAGNOSIS — N184 Chronic kidney disease, stage 4 (severe): Secondary | ICD-10-CM

## 2015-11-29 DIAGNOSIS — T82848A Pain from vascular prosthetic devices, implants and grafts, initial encounter: Secondary | ICD-10-CM

## 2015-11-29 MED ORDER — OXYCODONE HCL 5 MG PO TABS: 5.0000 mg | ORAL_TABLET | ORAL | Status: DC | PRN

## 2015-11-29 NOTE — Progress Notes (Signed)
    Postoperative Access Visit   History of Present Illness  Sandy [name] is a 40 y.o. year old female patient of Dr. Donnetta Hutching who is s/p Left upper arm AV fistula creation on 11/24/15. She comes in today with c/o severe pain in left AVF insertion site and swelling.  This is her first hemodialysis access. She has a history of hypertension.  She is not yet on hemodialysis. She denies fever or chills, denies drainage form incision.  Pt states she has 4 oxycodone 5 mg tablets left, has been using about 4 tablets/day, was prescribed #30 tablets on 11/24/15 by S. Rhynes, PA-C, 1 tablet every hours prn pain.  The patient's left antecubital incision is healed.  The patient notes no steal symptoms.  The patient is able to complete their activities of daily living.    For VQI Use Only  PRE-ADM LIVING: Home  AMB STATUS: Ambulatory  Physical Examination Filed Vitals:   11/29/15 1417  BP: 135/92  Pulse: 93  Temp: 98.3 F (36.8 C)  TempSrc: Oral  Height: 5\' 6"  (1.676 m)  Weight: 210 lb 3.2 oz (95.346 kg)  SpO2: 97%   Body mass index is 33.94 kg/(m^2).   Left antecubital Incision is healed, skin feels warm, hand grip is 4/5, sensation in digits is intact, no palpable thrill, bruit can not be auscultated  Medical Decision Making  Sandy [name] is a 40 y.o. year old female who presents s/p Left upper arm AV fistula creation on 11/24/15.  She requested refill of oxycodone for pain at the AVF site. She has minimal swelling at the left AVF site. Dr. Scot Dock spoke with and examined pt. AVF pulse is audible by Doppler, no thrill palpated, no bruit auscultated.  Left radial and ulnar pulses are audible by Doppler, attempts to palpate radial or ulnar pulse are painful for pt. Pt states she is elevating her arm; advised how to elevate above heart as much as possible.  Reorder oxycodone 5 mg, 1 tablet every 4 hours prn pain, disp #30, 0 refills.   She is already scheduled to return on 12/19/15 for  duplex of AVF, and see Dr. Donnetta Hutching on 12/26/15.  Worthington Cruzan, Sharmon Leyden, RN, MSN, FNP-C [facility] [place] Office: [phone]  11/29/2015, 2:15 PM  Clinic MD: Scot Dock

## 2015-12-15 ENCOUNTER — Encounter: Payer: Self-pay | Admitting: Vascular Surgery

## 2015-12-19 ENCOUNTER — Encounter (HOSPITAL_COMMUNITY): Payer: Medicaid Other

## 2015-12-26 ENCOUNTER — Encounter: Payer: Medicaid Other | Admitting: Vascular Surgery

## 2016-01-05 ENCOUNTER — Inpatient Hospital Stay (HOSPITAL_COMMUNITY)
Admission: EM | Admit: 2016-01-05 | Discharge: 2016-01-22 | DRG: 673 | Disposition: A | Discharge status: Home / Self Care | Payer: Medicaid Other | Attending: Internal Medicine | Admitting: Internal Medicine

## 2016-01-05 ENCOUNTER — Emergency Department (HOSPITAL_COMMUNITY): Payer: Medicaid Other

## 2016-01-05 ENCOUNTER — Encounter (HOSPITAL_COMMUNITY): Payer: Self-pay

## 2016-01-05 DIAGNOSIS — Z888 Allergy status to other drugs, medicaments and biological substances status: Secondary | ICD-10-CM

## 2016-01-05 DIAGNOSIS — Z87441 Personal history of nephrotic syndrome: Secondary | ICD-10-CM

## 2016-01-05 DIAGNOSIS — M7989 Other specified soft tissue disorders: Secondary | ICD-10-CM

## 2016-01-05 DIAGNOSIS — I82712 Chronic embolism and thrombosis of superficial veins of left upper extremity: Secondary | ICD-10-CM | POA: Diagnosis present

## 2016-01-05 DIAGNOSIS — I120 Hypertensive chronic kidney disease with stage 5 chronic kidney disease or end stage renal disease: Secondary | ICD-10-CM | POA: Diagnosis present

## 2016-01-05 DIAGNOSIS — N184 Chronic kidney disease, stage 4 (severe): Secondary | ICD-10-CM | POA: Diagnosis not present

## 2016-01-05 DIAGNOSIS — F17210 Nicotine dependence, cigarettes, uncomplicated: Secondary | ICD-10-CM | POA: Diagnosis present

## 2016-01-05 DIAGNOSIS — Z6834 Body mass index (BMI) 34.0-34.9, adult: Secondary | ICD-10-CM

## 2016-01-05 DIAGNOSIS — E669 Obesity, unspecified: Secondary | ICD-10-CM | POA: Diagnosis present

## 2016-01-05 DIAGNOSIS — R509 Fever, unspecified: Secondary | ICD-10-CM | POA: Diagnosis not present

## 2016-01-05 DIAGNOSIS — I10 Essential (primary) hypertension: Secondary | ICD-10-CM

## 2016-01-05 DIAGNOSIS — J9811 Atelectasis: Secondary | ICD-10-CM | POA: Diagnosis not present

## 2016-01-05 DIAGNOSIS — I742 Embolism and thrombosis of arteries of the upper extremities: Secondary | ICD-10-CM | POA: Diagnosis not present

## 2016-01-05 DIAGNOSIS — R0789 Other chest pain: Secondary | ICD-10-CM | POA: Diagnosis present

## 2016-01-05 DIAGNOSIS — Z9114 Patient's other noncompliance with medication regimen: Secondary | ICD-10-CM

## 2016-01-05 DIAGNOSIS — E872 Acidosis: Secondary | ICD-10-CM | POA: Diagnosis present

## 2016-01-05 DIAGNOSIS — N049 Nephrotic syndrome with unspecified morphologic changes: Secondary | ICD-10-CM | POA: Diagnosis not present

## 2016-01-05 DIAGNOSIS — Z992 Dependence on renal dialysis: Secondary | ICD-10-CM

## 2016-01-05 DIAGNOSIS — R609 Edema, unspecified: Secondary | ICD-10-CM

## 2016-01-05 DIAGNOSIS — Z9119 Patient's noncompliance with other medical treatment and regimen: Secondary | ICD-10-CM

## 2016-01-05 DIAGNOSIS — R0600 Dyspnea, unspecified: Secondary | ICD-10-CM | POA: Diagnosis not present

## 2016-01-05 DIAGNOSIS — D631 Anemia in chronic kidney disease: Secondary | ICD-10-CM | POA: Diagnosis present

## 2016-01-05 DIAGNOSIS — Z7952 Long term (current) use of systemic steroids: Secondary | ICD-10-CM

## 2016-01-05 DIAGNOSIS — N185 Chronic kidney disease, stage 5: Secondary | ICD-10-CM | POA: Diagnosis not present

## 2016-01-05 DIAGNOSIS — N186 End stage renal disease: Secondary | ICD-10-CM

## 2016-01-05 DIAGNOSIS — L03114 Cellulitis of left upper limb: Secondary | ICD-10-CM | POA: Diagnosis not present

## 2016-01-05 DIAGNOSIS — E8779 Other fluid overload: Secondary | ICD-10-CM | POA: Diagnosis not present

## 2016-01-05 DIAGNOSIS — K769 Liver disease, unspecified: Secondary | ICD-10-CM

## 2016-01-05 DIAGNOSIS — E8770 Fluid overload, unspecified: Secondary | ICD-10-CM | POA: Diagnosis present

## 2016-01-05 LAB — CBC
HCT: 31.3 % — ABNORMAL LOW (ref 36.0–46.0)
Hemoglobin: 9.1 g/dL — ABNORMAL LOW (ref 12.0–15.0)
MCH: 27.5 pg (ref 26.0–34.0)
MCHC: 29.1 g/dL — ABNORMAL LOW (ref 30.0–36.0)
MCV: 94.6 fL (ref 78.0–100.0)
PLATELETS: 333 10*3/uL (ref 150–400)
RBC: 3.31 MIL/uL — ABNORMAL LOW (ref 3.87–5.11)
RDW: 16.1 % — AB (ref 11.5–15.5)
WBC: 11.1 10*3/uL — ABNORMAL HIGH (ref 4.0–10.5)

## 2016-01-05 LAB — COMPREHENSIVE METABOLIC PANEL
ALT: 9 U/L — ABNORMAL LOW (ref 14–54)
ANION GAP: 11 (ref 5–15)
AST: 15 U/L (ref 15–41)
Albumin: 1.5 g/dL — ABNORMAL LOW (ref 3.5–5.0)
Alkaline Phosphatase: 57 U/L (ref 38–126)
BUN: 61 mg/dL — ABNORMAL HIGH (ref 6–20)
CHLORIDE: 115 mmol/L — AB (ref 101–111)
CO2: 17 mmol/L — ABNORMAL LOW (ref 22–32)
Calcium: 8.1 mg/dL — ABNORMAL LOW (ref 8.9–10.3)
Creatinine, Ser: 3.13 mg/dL — ABNORMAL HIGH (ref 0.44–1.00)
GFR, EST AFRICAN AMERICAN: 21 mL/min — AB (ref >60)
GFR, EST NON AFRICAN AMERICAN: 18 mL/min — AB (ref >60)
Glucose, Bld: 114 mg/dL — ABNORMAL HIGH (ref 65–99)
POTASSIUM: 4.7 mmol/L (ref 3.5–5.1)
Sodium: 143 mmol/L (ref 135–145)
Total Bilirubin: 0.4 mg/dL (ref 0.3–1.2)
Total Protein: 5.2 g/dL — ABNORMAL LOW (ref 6.5–8.1)

## 2016-01-05 LAB — BRAIN NATRIURETIC PEPTIDE: B NATRIURETIC PEPTIDE 5: 317.3 pg/mL — AB (ref 0.0–100.0)

## 2016-01-05 LAB — I-STAT TROPONIN, ED: TROPONIN I, POC: 0.02 ng/mL (ref 0.00–0.08)

## 2016-01-05 MED ORDER — FUROSEMIDE 10 MG/ML IJ SOLN
40.0000 mg | Freq: Once | INTRAMUSCULAR | Status: AC
  Administered 2016-01-06: 40 mg via INTRAVENOUS
  Filled 2016-01-05: qty 4

## 2016-01-05 MED ORDER — LABETALOL HCL 200 MG PO TABS
200.0000 mg | ORAL_TABLET | Freq: Once | ORAL | Status: AC
  Administered 2016-01-06: 200 mg via ORAL
  Filled 2016-01-05: qty 1

## 2016-01-05 MED ORDER — FENTANYL CITRATE (PF) 100 MCG/2ML IJ SOLN
50.0000 ug | Freq: Once | INTRAMUSCULAR | Status: AC
  Administered 2016-01-06: 50 ug via INTRAVENOUS
  Filled 2016-01-05: qty 2

## 2016-01-05 MED ORDER — HYDRALAZINE HCL 50 MG PO TABS
50.0000 mg | ORAL_TABLET | Freq: Once | ORAL | Status: AC
  Administered 2016-01-06: 50 mg via ORAL
  Filled 2016-01-05: qty 1

## 2016-01-05 NOTE — ED Provider Notes (Signed)
CSN: [id number]     Arrival date & time 01/05/16  1633 History   First MD Initiated Contact with Patient 01/05/16 1810     Chief Complaint  Patient presents with  . Leg Swelling     (Consider location/radiation/quality/duration/timing/severity/associated sxs/prior Treatment) Patient is a 40 y.o. female presenting with general illness.  Illness Location:  Generalized Quality:  Swelling Severity:  Severe Onset quality:  Gradual Duration:  1 week Timing:  Constant Progression:  Worsening Chronicity:  Recurrent Context:  Patient has not been taking her diuretics Relieved by:  Nothing Worsened by:  Walking Ineffective treatments:  None tried Associated symptoms: cough, fatigue and shortness of breath   Associated symptoms: no abdominal pain, no chest pain, no congestion, no fever, no headaches, no nausea, no rash, no rhinorrhea and no vomiting     Past Medical History  Diagnosis Date  . Hypertension   . Anemia   . 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 Anasarca associated with disorder of kidney    Past Surgical History  Procedure Laterality Date  . Cesarean section    . Renal biopsy    . Av fistula placement Left 11/24/2015    Procedure: ARTERIOVENOUS (AV) FISTULA CREATION-LEFT UPPER ARM;  Surgeon: Rosetta Posner, MD;  Location: [facility];  Service: Vascular;  Laterality: Left;   Family History  Problem Relation Age of Onset  . Diabetes Father    Social History  Substance Use Topics  . Smoking status: Former Smoker -- 0.25 packs/day    Types: Cigarettes    Quit date: 12/23/2015  . Smokeless tobacco: Never Used  . Alcohol Use: No   OB History    No data available     Review of Systems  Constitutional: Positive for fatigue. Negative for fever and chills.  HENT: Negative for congestion and rhinorrhea.   Eyes: Negative for visual disturbance.  Respiratory: Positive for cough and shortness of breath.   Cardiovascular: Positive  for leg swelling. Negative for chest pain.  Gastrointestinal: Positive for abdominal distention. Negative for nausea, vomiting and abdominal pain.  Genitourinary: Negative for flank pain.  Musculoskeletal: Negative for neck pain.  Skin: Negative for rash.  Allergic/Immunologic: Negative for immunocompromised state.  Neurological: Negative for syncope and headaches.      Allergies  Lisinopril  Home Medications   Prior to Admission medications   Medication Sig Start Date End Date Taking? Authorizing Provider  acetaminophen (TYLENOL) 500 MG tablet Take 500 mg by mouth every 6 (six) hours as needed for moderate pain.   Yes Historical Provider, MD  hydrALAZINE (APRESOLINE) 50 MG tablet Take 50 mg by mouth 3 (three) times daily.   Yes Historical Provider, MD  labetalol (NORMODYNE) 200 MG tablet Take 1 tablet (200 mg total) by mouth 2 (two) times daily. 10/25/15  Yes Theodis Blaze, MD  metolazone (ZAROXOLYN) 10 MG tablet Take 1 tablet (10 mg total) by mouth 2 (two) times daily. 10/25/15  Yes Theodis Blaze, MD  predniSONE (DELTASONE) 5 MG tablet Take 5 mg by mouth daily with breakfast.   Yes Historical Provider, MD  sodium bicarbonate 650 MG tablet Take 1 tablet (650 mg total) by mouth 2 (two) times daily. 10/26/15  Yes Theodis Blaze, MD  spironolactone (ALDACTONE) 50 MG tablet Take 1 tablet (50 mg total) by mouth daily. 10/25/15  Yes Theodis Blaze, MD  torsemide (DEMADEX) 100 MG tablet Take 1 tablet (100 mg total)  by mouth 2 (two) times daily. 10/25/15  Yes Theodis Blaze, MD   BP 176/124 mmHg  Pulse 91  Temp(Src) 98 F (36.7 C) (Oral)  Resp 16  Ht 5\' 6"  (1.676 m)  Wt 103.647 kg  BMI 36.90 kg/m2  SpO2 100% Physical Exam  Constitutional: She is oriented to person, place, and time. She appears well-developed and well-nourished. No distress.  HENT:  Head: Normocephalic.  Mouth/Throat: No oropharyngeal exudate.  Eyes: Pupils are equal, round, and reactive to light.  Neck: Normal range of motion.  Neck supple.  Cardiovascular: Normal rate, normal heart sounds and intact distal pulses.  Exam reveals no friction rub.   No murmur heard. Pulmonary/Chest: Effort normal and breath sounds normal. No respiratory distress. She has no wheezes.  Abdominal: She exhibits no distension. There is no tenderness.  Musculoskeletal: She exhibits edema (3+ pitting edema throughout entire BLE).  Neurological: She is alert and oriented to person, place, and time.  Skin: Skin is warm. No rash noted.  Nursing note and vitals reviewed.   ED Course  Procedures (including critical care time) Labs Review Labs Reviewed  CBC - Abnormal; Notable for the following:    WBC 11.1 (*)    RBC 3.31 (*)    Hemoglobin 9.1 (*)    HCT 31.3 (*)    MCHC 29.1 (*)    RDW 16.1 (*)    All other components within normal limits  COMPREHENSIVE METABOLIC PANEL - Abnormal; Notable for the following:    Chloride 115 (*)    CO2 17 (*)    Glucose, Bld 114 (*)    BUN 61 (*)    Creatinine, Ser 3.13 (*)    Calcium 8.1 (*)    Total Protein 5.2 (*)    Albumin 1.5 (*)    ALT 9 (*)    GFR calc non Af Amer 18 (*)    GFR calc Af Amer 21 (*)    All other components within normal limits  BRAIN NATRIURETIC PEPTIDE - Abnormal; Notable for the following:    B Natriuretic Peptide 317.3 (*)    All other components within normal limits  CBC  BASIC METABOLIC PANEL  TROPONIN I  TROPONIN I  TROPONIN I  I-STAT TROPOININ, ED    Imaging Review Dg Chest 2 View  01/05/2016  CLINICAL DATA:  Bilateral foot edema and abdominal edema for 1 week. EXAM: CHEST  2 VIEW COMPARISON:  10/14/2015. FINDINGS: Trachea is midline. Heart size normal. Minimal subpleural atelectasis in the left upper lobe and the left lung base. Lungs are otherwise clear. Trace left pleural effusion. IMPRESSION: Minimal left basilar atelectasis and tiny left pleural effusion. Electronically Signed   By: Lorin Picket M.D.   On: 01/05/2016 17:11   I have personally  reviewed and evaluated these images and lab results as part of my medical decision-making.   EKG Interpretation None      MDM   40 yo F with PMHx of FSGS with subsequent CKD who p/w worsening LE edema, abdominal distension, and DOE in setting of >20 lb weight gain in one week, not taking diuretics. On arrival, pt hypertensive, otherwise VSS. Pt in NAD but with marked edema BLE. History, exam c/w anasarca 2/2 worsening CKD and medication non-adherence, with likely non--adherence to low sodium/fluid restriction diet as well. CXR shows small left effusion, but no overt pulmonary edema. Denies any CP or signs of ACS and trop negative. BMP shows baseline Cr but worsening Co2 of 17. BNP  elevated, c/w hypervolemia. CBC with WBC 11.1, anemia of 9.1. No fever or infectious sx. Pt remains hypertensive but states this is her baseline. Will give PO antihypertensives, IV lasix, and admit for aggressive diuresis, monitoring of BMP/renal function. Pt in agreement.  Clinical Impression: 1. Hypervolemia, unspecified hypervolemia type   2. Accelerated hypertension   3. Pitting edema   4. CKD (chronic kidney disease), stage 4 (severe) (HCC)     Disposition: Admit  Condition: Stable  Pt seen in conjunction with Dr. Hassan Buckler, MD 01/06/16 [place], MD 01/07/16 1357

## 2016-01-05 NOTE — ED Notes (Signed)
Happy meal provided per EDP

## 2016-01-05 NOTE — ED Notes (Signed)
Pt here with significant swelling to bilateral lower extremities and abdomen. She has kidney disease but is not yet receiving Dialysis. Denies chest pain but reports SOB when walking only.

## 2016-01-06 ENCOUNTER — Encounter (HOSPITAL_COMMUNITY): Payer: Self-pay | Admitting: *Deleted

## 2016-01-06 DIAGNOSIS — E8770 Fluid overload, unspecified: Secondary | ICD-10-CM | POA: Diagnosis present

## 2016-01-06 DIAGNOSIS — R0789 Other chest pain: Secondary | ICD-10-CM

## 2016-01-06 DIAGNOSIS — I10 Essential (primary) hypertension: Secondary | ICD-10-CM

## 2016-01-06 LAB — BASIC METABOLIC PANEL
ANION GAP: 9 (ref 5–15)
BUN: 61 mg/dL — ABNORMAL HIGH (ref 6–20)
CHLORIDE: 117 mmol/L — AB (ref 101–111)
CO2: 17 mmol/L — ABNORMAL LOW (ref 22–32)
Calcium: 7.8 mg/dL — ABNORMAL LOW (ref 8.9–10.3)
Creatinine, Ser: 3.26 mg/dL — ABNORMAL HIGH (ref 0.44–1.00)
GFR, EST AFRICAN AMERICAN: 20 mL/min — AB (ref >60)
GFR, EST NON AFRICAN AMERICAN: 17 mL/min — AB (ref >60)
Glucose, Bld: 84 mg/dL (ref 65–99)
POTASSIUM: 4.2 mmol/L (ref 3.5–5.1)
SODIUM: 143 mmol/L (ref 135–145)

## 2016-01-06 LAB — CBC
HCT: 27.8 % — ABNORMAL LOW (ref 36.0–46.0)
HEMOGLOBIN: 8.3 g/dL — AB (ref 12.0–15.0)
MCH: 28.1 pg (ref 26.0–34.0)
MCHC: 29.9 g/dL — ABNORMAL LOW (ref 30.0–36.0)
MCV: 94.2 fL (ref 78.0–100.0)
PLATELETS: 274 10*3/uL (ref 150–400)
RBC: 2.95 MIL/uL — AB (ref 3.87–5.11)
RDW: 16.2 % — ABNORMAL HIGH (ref 11.5–15.5)
WBC: 10.8 10*3/uL — AB (ref 4.0–10.5)

## 2016-01-06 LAB — GLUCOSE, CAPILLARY: Glucose-Capillary: 115 mg/dL — ABNORMAL HIGH (ref 65–99)

## 2016-01-06 LAB — TROPONIN I: Troponin I: <0.03 ng/mL (ref <0.031)

## 2016-01-06 MED ORDER — LABETALOL HCL 200 MG PO TABS
200.0000 mg | ORAL_TABLET | Freq: Two times a day (BID) | ORAL | Status: DC
  Administered 2016-01-06 – 2016-01-19 (×26): 200 mg via ORAL
  Filled 2016-01-06 (×27): qty 1

## 2016-01-06 MED ORDER — FUROSEMIDE 10 MG/ML IJ SOLN
160.0000 mg | INTRAVENOUS | Status: DC
  Administered 2016-01-06 – 2016-01-09 (×9): 160 mg via INTRAVENOUS
  Filled 2016-01-06 (×23): qty 16

## 2016-01-06 MED ORDER — FENTANYL CITRATE (PF) 100 MCG/2ML IJ SOLN
50.0000 ug | Freq: Once | INTRAMUSCULAR | Status: AC
  Administered 2016-01-06: 50 ug via INTRAVENOUS
  Filled 2016-01-06: qty 2

## 2016-01-06 MED ORDER — ONDANSETRON HCL 4 MG PO TABS: 4.0000 mg | ORAL_TABLET | Freq: Four times a day (QID) | ORAL | Status: DC | PRN

## 2016-01-06 MED ORDER — SODIUM BICARBONATE 650 MG PO TABS
650.0000 mg | ORAL_TABLET | Freq: Three times a day (TID) | ORAL | Status: DC
  Administered 2016-01-06 – 2016-01-13 (×24): 650 mg via ORAL
  Filled 2016-01-06 (×26): qty 1

## 2016-01-06 MED ORDER — SODIUM BICARBONATE 650 MG PO TABS: 650.0000 mg | ORAL_TABLET | Freq: Two times a day (BID) | ORAL | Status: DC

## 2016-01-06 MED ORDER — HYDROCODONE-ACETAMINOPHEN 5-325 MG PO TABS
1.0000 | ORAL_TABLET | Freq: Four times a day (QID) | ORAL | Status: DC | PRN
  Administered 2016-01-06 – 2016-01-16 (×6): 1 via ORAL
  Filled 2016-01-06 (×7): qty 1

## 2016-01-06 MED ORDER — HYDRALAZINE HCL 50 MG PO TABS
50.0000 mg | ORAL_TABLET | Freq: Three times a day (TID) | ORAL | Status: DC
  Administered 2016-01-06 – 2016-01-19 (×36): 50 mg via ORAL
  Filled 2016-01-06 (×36): qty 1

## 2016-01-06 MED ORDER — SODIUM CHLORIDE 0.9 % IV SOLN
125.0000 mg | Freq: Every day | INTRAVENOUS | Status: AC
  Administered 2016-01-06 – 2016-01-12 (×4): 125 mg via INTRAVENOUS
  Filled 2016-01-06 (×11): qty 10

## 2016-01-06 MED ORDER — ONDANSETRON HCL 4 MG/2ML IJ SOLN
4.0000 mg | Freq: Four times a day (QID) | INTRAMUSCULAR | Status: DC | PRN
  Administered 2016-01-07 – 2016-01-12 (×7): 4 mg via INTRAVENOUS
  Filled 2016-01-06 (×5): qty 2

## 2016-01-06 MED ORDER — ACETAMINOPHEN 500 MG PO TABS
500.0000 mg | ORAL_TABLET | Freq: Four times a day (QID) | ORAL | Status: DC | PRN
  Administered 2016-01-06 – 2016-01-22 (×31): 500 mg via ORAL
  Filled 2016-01-06 (×37): qty 1

## 2016-01-06 MED ORDER — COLCHICINE 0.6 MG PO TABS
0.6000 mg | ORAL_TABLET | Freq: Every day | ORAL | Status: DC
Start: 2016-01-06 — End: 2016-01-09
  Administered 2016-01-06 – 2016-01-09 (×4): 0.6 mg via ORAL
  Filled 2016-01-06 (×4): qty 1

## 2016-01-06 MED ORDER — FUROSEMIDE 10 MG/ML IJ SOLN: 80.0000 mg | Freq: Four times a day (QID) | INTRAMUSCULAR | Status: DC

## 2016-01-06 MED ORDER — PREDNISONE 5 MG PO TABS
5.0000 mg | ORAL_TABLET | Freq: Every day | ORAL | Status: DC
  Administered 2016-01-06 – 2016-01-17 (×12): 5 mg via ORAL
  Filled 2016-01-06 (×12): qty 1

## 2016-01-06 MED ORDER — METOLAZONE 10 MG PO TABS
10.0000 mg | ORAL_TABLET | Freq: Every day | ORAL | Status: DC
  Administered 2016-01-06 – 2016-01-09 (×3): 10 mg via ORAL
  Filled 2016-01-06 (×4): qty 1

## 2016-01-06 MED ORDER — FEBUXOSTAT 40 MG PO TABS
40.0000 mg | ORAL_TABLET | Freq: Every day | ORAL | Status: DC
  Administered 2016-01-06 – 2016-01-22 (×16): 40 mg via ORAL
  Filled 2016-01-06 (×16): qty 1

## 2016-01-06 MED ORDER — HYDRALAZINE HCL 20 MG/ML IJ SOLN
20.0000 mg | Freq: Four times a day (QID) | INTRAMUSCULAR | Status: DC | PRN
  Administered 2016-01-07 – 2016-01-19 (×6): 20 mg via INTRAVENOUS
  Filled 2016-01-06 (×5): qty 1

## 2016-01-06 MED ORDER — HEPARIN SODIUM (PORCINE) 5000 UNIT/ML IJ SOLN
5000.0000 [IU] | Freq: Three times a day (TID) | INTRAMUSCULAR | Status: DC
  Administered 2016-01-06 – 2016-01-22 (×44): 5000 [IU] via SUBCUTANEOUS
  Filled 2016-01-06 (×39): qty 1

## 2016-01-06 MED ORDER — DARBEPOETIN ALFA 100 MCG/0.5ML IJ SOSY
100.0000 ug | PREFILLED_SYRINGE | INTRAMUSCULAR | Status: DC
  Administered 2016-01-06 – 2016-01-13 (×2): 100 ug via SUBCUTANEOUS
  Filled 2016-01-06 (×4): qty 0.5

## 2016-01-06 MED ORDER — SODIUM CHLORIDE 0.9 % IV SOLN
510.0000 mg | INTRAVENOUS | Status: DC
Start: 2016-01-06 — End: 2016-01-06

## 2016-01-06 MED ORDER — SODIUM CHLORIDE 0.9% FLUSH
3.0000 mL | Freq: Two times a day (BID) | INTRAVENOUS | Status: DC
  Administered 2016-01-06 – 2016-01-22 (×23): 3 mL via INTRAVENOUS

## 2016-01-06 MED ORDER — OXYCODONE HCL 5 MG PO TABS
5.0000 mg | ORAL_TABLET | ORAL | Status: DC | PRN
  Administered 2016-01-06 – 2016-01-16 (×34): 5 mg via ORAL
  Filled 2016-01-06 (×36): qty 1

## 2016-01-06 MED ORDER — FUROSEMIDE 10 MG/ML IJ SOLN
60.0000 mg | Freq: Four times a day (QID) | INTRAMUSCULAR | Status: DC
  Administered 2016-01-06: 60 mg via INTRAVENOUS
  Filled 2016-01-06: qty 6

## 2016-01-06 NOTE — Progress Notes (Signed)
[facility] PROGRESS NOTE  Sandy [name] [id number] DOB: 1976-04-28 DOA: 01/05/2016 PCP: No PCP Per Patient  Assessment/Plan: Sandy [name] is a 40 y.o. 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 She reports a 23lb weight gain over the past 7-10 days. She has had atypical chest pain and shortness of breath. She held her diuretic for two days because she developed bilateral foot pain that she thought was gout. The foot pain resolved without specific intervention. She tries to adhere to a 1.5L fluid restriction daily and a low sodium diet, but she admits that she traveled last week and had an increased amount of fast food. She had vascular access placed in her left arm recently, but she has not needed hemodialysis thus far.  Anasarca, nephrotic syndrome, FSGS, CKD stage 4 , medical noncompliance --Diuresis with IV lasix 80 IV every 6 hours.  --Compression hose --1200cc fluid restriction --Daily weights --Strict I/O --nephrology consulted.   Metabolic acidosis:  Increase sodium bicarb to TID.   Atypical chest pain --troponin, first negative --Echo pending --EKG; peak T wave.   Accelerated HTN --Continue home medications --IV hydralazine prn --Hydralazine, labetalol, lasix,   Code Status: Full code.  Family Communication: care discussed with patient.  Disposition Plan: remain inpatient for tx anasarca/    Consultants:  Nephrology   Procedures:  none  Antibiotics:  none  HPI/Subjective: Still with significant LE edema.  No significant urine out put.   Objective: Filed Vitals:   01/06/16 0144 01/06/16 0407  BP: 176/124 152/97  Pulse: 91 87  Temp: 98 F (36.7 C) 98.4 F (36.9 C)  Resp:  16    Intake/Output Summary (Last 24 hours) at 01/06/16 0725 Last data filed at 01/06/16 0600  Gross per 24 hour  Intake    120 ml  Output    200 ml  Net    -80 ml   Filed Weights   01/05/16 1649 01/06/16 0144  Weight: 104.237 kg (229 lb 12.8 oz) 103.647 kg (228 lb 8 oz)    Exam:   General:  Alert in no distress  Cardiovascular: S 1, S 2 RRR  Respiratory: CTA  Abdomen: BS present, soft, nt  Musculoskeletal: plus 2 edema.   Data Reviewed: Basic Metabolic Panel:  Recent Labs Lab 01/05/16 1702  NA 143  K 4.7  CL 115*  CO2 17*  GLUCOSE 114*  BUN 61*  CREATININE 3.13*  CALCIUM 8.1*   Liver Function Tests:  Recent Labs Lab 01/05/16 1702  AST 15  ALT 9*  ALKPHOS 57  BILITOT 0.4  PROT 5.2*  ALBUMIN 1.5*   No results for input(s): LIPASE, AMYLASE in the last 168 hours. No results for input(s): AMMONIA in the last 168 hours. CBC:  Recent Labs Lab 01/05/16 1702 01/06/16 0352  WBC 11.1* 10.8*  HGB 9.1* 8.3*  HCT 31.3* 27.8*  MCV 94.6 94.2  PLT 333 274   Cardiac Enzymes:  Recent Labs Lab 01/06/16 0351  TROPONINI <0.03   BNP (last 3 results)  Recent Labs  10/14/15 1614 01/05/16 1838  BNP 201.7* 317.3*    ProBNP (last 3 results) No results for input(s): PROBNP in the last 8760 hours.  CBG: No results for input(s): GLUCAP in the last 168 hours.  No results found for this or any previous visit (from the past 240 hour(s)).  Studies: Dg Chest 2 View  01/05/2016  CLINICAL DATA:  Bilateral foot edema and abdominal edema for 1 week. EXAM: CHEST  2 VIEW COMPARISON:  10/14/2015. FINDINGS: Trachea is midline. Heart size normal. Minimal subpleural atelectasis in the left upper lobe and the left lung base. Lungs are otherwise clear. Trace left pleural effusion. IMPRESSION: Minimal left basilar atelectasis and tiny left pleural effusion. Electronically Signed   By: Lorin Picket M.D.   On: 01/05/2016 17:11    Scheduled Meds: . furosemide  60 mg Intravenous Q6H  . heparin  5,000 Units Subcutaneous 3 times per day  . hydrALAZINE  50 mg Oral  3 times per day  . labetalol  200 mg Oral BID  . predniSONE  5 mg Oral Q breakfast  . sodium bicarbonate  650 mg Oral BID  . sodium chloride flush  3 mL Intravenous Q12H   Continuous Infusions:   Principal Problem:   Hypervolemia Active Problems:   Anasarca associated with disorder of kidney   CKD (chronic kidney disease), stage IV (HCC)   Accelerated hypertension   Atypical chest pain   Volume overload    Time spent: 35 minutes.     Niel Hummer A  [facility] Pager [phone]. If 7PM-7AM, please contact night-coverage at [email or web address], password [facility] 01/06/2016, 7:25 AM  LOS: 1 day

## 2016-01-06 NOTE — Consult Note (Signed)
[facility] Renal Consultation Note  Requesting MD: Regalado Indication for Consultation: CKD- volume overload  HPI:  Sandy [name] is a 40 y.o. female  with  FSG that was diagnosed by kidney biopsy in the summer of 2015 in [place]. She had been followed by a nephrologist there. Apparently, she initially responded some to steroids but when they were weaned she would re-flare. She's been attempted on CellCept, cyclosporine and most recently a six-month course of IV Cytoxan directed by a nephrologist at [facility]. We encountered her during an inpatient hospitalization in late January 2017 for nephrotic syndrome. Urine collection revealed 21 g of protein. A renal biopsy was done again to look for possible signs that she could respond to future treatment. Her glomeruli actually looked reasonably well there was only one that showed FSG, she had effacement of foot processes but also significant tubulo interstitial compartment disease. In addition, her GFR is now lower with a creatinine of 3.3. We had extensive discussions and basically decided that further immunosuppressive therapy is unlikely to be successful. Therefore, we are just trying to maintain her volume status as best we can but will plan for the future which will include dialysis and transplant.  I last saw her on 3/14- weight was 204 with plans for a follow up visit on 4/24.  She had been steadily losing fluid and steroids were taken down. She is status post left upper arm AV fistula placed on March 3 but development has been slow- she missed an appointment with [facility] on March 28. Appt with [facility] transplant is pending- also been attempting to treat her iron deficiency but she has missed appts there as well.  She told me in March that she feels fatigued and nauseated so could definitely be having some uremic symptoms. I was hoping that we might be able to wait until her fistula was mature but she called the office last week saying that she  couldn't take her symptoms anymore and plans were being made to get a PermCath placed and start her on dialysis as an outpatient.  Unfortunately, she presents emergency department last night with worsening volume overload - weight 228 however in the setting of missing diuretic medications.  She says that she thinks the diuretics are giving her cramping    CREATININE, SER  Date/Time Value Ref Range Status  01/06/2016 03:52 AM 3.26* 0.44 - 1.00 mg/dL Final  01/05/2016 05:02 PM 3.13* 0.44 - 1.00 mg/dL Final  11/22/2015 01:25 PM 3.07* 0.44 - 1.00 mg/dL Final  10/25/2015 05:51 AM 3.19* 0.44 - 1.00 mg/dL Final  10/24/2015 05:50 AM 3.33* 0.44 - 1.00 mg/dL Final  10/23/2015 08:50 AM 3.40* 0.44 - 1.00 mg/dL Final  10/22/2015 05:11 AM 3.51* 0.44 - 1.00 mg/dL Final  10/21/2015 08:27 AM 3.37* 0.44 - 1.00 mg/dL Final  10/20/2015 05:14 AM 3.46* 0.44 - 1.00 mg/dL Final  10/19/2015 08:20 AM 3.43* 0.44 - 1.00 mg/dL Final  10/18/2015 05:00 AM 3.37* 0.44 - 1.00 mg/dL Final  10/17/2015 05:08 AM 2.97* 0.44 - 1.00 mg/dL Final  10/16/2015 05:31 AM 3.16* 0.44 - 1.00 mg/dL Final  10/15/2015 05:51 AM 3.51* 0.44 - 1.00 mg/dL Final  10/14/2015 04:13 PM 3.54* 0.44 - 1.00 mg/dL Final     PMHx:   Past Medical History  Diagnosis Date  . Hypertension   . Anemia   . 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 Anasarca associated with disorder of kidney  Past Surgical History  Procedure Laterality Date  . Cesarean section    . Renal biopsy    . Av fistula placement Left 11/24/2015    Procedure: ARTERIOVENOUS (AV) FISTULA CREATION-LEFT UPPER ARM;  Surgeon: Rosetta Posner, MD;  Location: [facility] OR;  Service: Vascular;  Laterality: Left;    Family Hx:  Family History  Problem Relation Age of Onset  . Diabetes Father     Social History:  reports that she quit smoking about 2 weeks ago. Her smoking use included Cigarettes. She smoked 0.25 packs per day. She has never used  smokeless tobacco. She reports that she does not drink alcohol or use illicit drugs.  Allergies:  Allergies  Allergen Reactions  . Lisinopril Cough    Medications: Prior to Admission medications   Medication Sig Start Date End Date Taking? Authorizing Provider  acetaminophen (TYLENOL) 500 MG tablet Take 500 mg by mouth every 6 (six) hours as needed for moderate pain.   Yes Historical Provider, MD  hydrALAZINE (APRESOLINE) 50 MG tablet Take 50 mg by mouth 3 (three) times daily.   Yes Historical Provider, MD  labetalol (NORMODYNE) 200 MG tablet Take 1 tablet (200 mg total) by mouth 2 (two) times daily. 10/25/15  Yes Theodis Blaze, MD  metolazone (ZAROXOLYN) 10 MG tablet Take 1 tablet (10 mg total) by mouth 2 (two) times daily. 10/25/15  Yes Theodis Blaze, MD  predniSONE (DELTASONE) 5 MG tablet Take 5 mg by mouth daily with breakfast.   Yes Historical Provider, MD  sodium bicarbonate 650 MG tablet Take 1 tablet (650 mg total) by mouth 2 (two) times daily. 10/26/15  Yes Theodis Blaze, MD  spironolactone (ALDACTONE) 50 MG tablet Take 1 tablet (50 mg total) by mouth daily. 10/25/15  Yes Theodis Blaze, MD  torsemide (DEMADEX) 100 MG tablet Take 1 tablet (100 mg total) by mouth 2 (two) times daily. 10/25/15  Yes Theodis Blaze, MD    I have reviewed the patient's current medications.  Labs:  Results for orders placed or performed during the hospital encounter of 01/05/16 (from the past 48 hour(s))  I-Stat Troponin, ED (not at [facility])     Status: None   Collection Time: 01/05/16  4:56 PM  Result Value Ref Range   Troponin i, poc 0.02 0.00 - 0.08 ng/mL   Comment 3            Comment: Due to the release kinetics of cTnI, a negative result within the first hours of the onset of symptoms does not rule out myocardial infarction with certainty. If myocardial infarction is still suspected, repeat the test at appropriate intervals.   CBC     Status: Abnormal   Collection Time: 01/05/16  5:02 PM  Result Value  Ref Range   WBC 11.1 (H) 4.0 - 10.5 K/uL   RBC 3.31 (L) 3.87 - 5.11 MIL/uL   Hemoglobin 9.1 (L) 12.0 - 15.0 g/dL   HCT 31.3 (L) 36.0 - 46.0 %   MCV 94.6 78.0 - 100.0 fL   MCH 27.5 26.0 - 34.0 pg   MCHC 29.1 (L) 30.0 - 36.0 g/dL   RDW 16.1 (H) 11.5 - 15.5 %   Platelets 333 150 - 400 K/uL  Comprehensive metabolic panel     Status: Abnormal   Collection Time: 01/05/16  5:02 PM  Result Value Ref Range   Sodium 143 135 - 145 mmol/L   Potassium 4.7 3.5 - 5.1 mmol/L   Chloride 115 (  H) 101 - 111 mmol/L   CO2 17 (L) 22 - 32 mmol/L   Glucose, Bld 114 (H) 65 - 99 mg/dL   BUN 61 (H) 6 - 20 mg/dL   Creatinine, Ser 3.13 (H) 0.44 - 1.00 mg/dL   Calcium 8.1 (L) 8.9 - 10.3 mg/dL   Total Protein 5.2 (L) 6.5 - 8.1 g/dL   Albumin 1.5 (L) 3.5 - 5.0 g/dL   AST 15 15 - 41 U/L   ALT 9 (L) 14 - 54 U/L   Alkaline Phosphatase 57 38 - 126 U/L   Total Bilirubin 0.4 0.3 - 1.2 mg/dL   GFR calc non Af Amer 18 (L) >60 mL/min   GFR calc Af Amer 21 (L) >60 mL/min    Comment: (NOTE) The eGFR has been calculated using the CKD EPI equation. This calculation has not been validated in all clinical situations. eGFR's persistently <60 mL/min signify possible Chronic Kidney Disease.    Anion gap 11 5 - 15  Brain natriuretic peptide     Status: Abnormal   Collection Time: 01/05/16  6:38 PM  Result Value Ref Range   B Natriuretic Peptide 317.3 (H) 0.0 - 100.0 pg/mL  Troponin I     Status: None   Collection Time: 01/06/16  3:51 AM  Result Value Ref Range   Troponin I <0.03 <0.031 ng/mL    Comment:        NO INDICATION OF MYOCARDIAL INJURY.   CBC     Status: Abnormal   Collection Time: 01/06/16  3:52 AM  Result Value Ref Range   WBC 10.8 (H) 4.0 - 10.5 K/uL   RBC 2.95 (L) 3.87 - 5.11 MIL/uL   Hemoglobin 8.3 (L) 12.0 - 15.0 g/dL   HCT 27.8 (L) 36.0 - 46.0 %   MCV 94.2 78.0 - 100.0 fL   MCH 28.1 26.0 - 34.0 pg   MCHC 29.9 (L) 30.0 - 36.0 g/dL   RDW 16.2 (H) 11.5 - 15.5 %   Platelets 274 150 - 400 K/uL   Basic metabolic panel     Status: Abnormal   Collection Time: 01/06/16  3:52 AM  Result Value Ref Range   Sodium 143 135 - 145 mmol/L   Potassium 4.2 3.5 - 5.1 mmol/L   Chloride 117 (H) 101 - 111 mmol/L   CO2 17 (L) 22 - 32 mmol/L   Glucose, Bld 84 65 - 99 mg/dL   BUN 61 (H) 6 - 20 mg/dL   Creatinine, Ser 3.26 (H) 0.44 - 1.00 mg/dL   Calcium 7.8 (L) 8.9 - 10.3 mg/dL   GFR calc non Af Amer 17 (L) >60 mL/min   GFR calc Af Amer 20 (L) >60 mL/min    Comment: (NOTE) The eGFR has been calculated using the CKD EPI equation. This calculation has not been validated in all clinical situations. eGFR's persistently <60 mL/min signify possible Chronic Kidney Disease.    Anion gap 9 5 - 15  Troponin I     Status: None   Collection Time: 01/06/16  7:49 AM  Result Value Ref Range   Troponin I <0.03 <0.031 ng/mL    Comment:        NO INDICATION OF MYOCARDIAL INJURY.      ROS:  A comprehensive review of systems was negative except for: Constitutional: positive for fatigue Cardiovascular: positive for chest pain and lower extremity edema Musculoskeletal: positive for arthralgias  Physical Exam: Filed Vitals:   01/06/16 0407 01/06/16 0900  BP: 152/97  121/79  Pulse: 87 89  Temp: 98.4 F (36.9 C) 97.8 F (36.6 C)  Resp: 16 16     General: Fairly well appearing black female with anasarca but no acute distress HEENT: Pupils are equally round and reactive to light, extra motions are intact, mucous numbers are moist  Neck: No JVD  Heart: Regular rate and rhythm  Lungs: Mostly clear  Abdomen: Soft, nontender, abdominal wall edema  Extremities: Pitting edema throughout legs  Skin: Warm and dry  Neuro: Alert and nonfocal  She has an AV fistula in her left upper arm but I am unable to appreciate a thrill or bruit   Assessment/Plan: 40 year old black female with a prolonged course with FSG that's been refractory to treatment now presenting with worsening volume status in the setting of  weaning steroids and also uremic symptoms that will necessitate initiation of dialysis  1.Renal- advanced CKD secondary to FSG and also volume overload that's refractory to treatment. Plans were being made to initiate dialysis in the outpatient setting. We will plan to go ahead and initiate dialysis while she is here. Because there is no acute indications and is a holiday weekend we'll see if we can get a PermCath placed on Monday followed by initiation of dialysis.  I will actually call [facility] because they need to look at her fistula as well - make sure her AVF is on track to development versus requiring a another intervention  2. Hypertension/volume  - massive volume overload. We'll place on max diuretics for now with the ultimate plan of getting her started on dialysis. However, don't see her first dialysis treatment happening until Monday.  She is on labetalol for now is okay to continue 3. Pains in her legs- she did have a uric acid of close to 11 in the past. I'm not sure if this is gout or just leg cramps related to CK D. We'll check another uric acid level and also start her on some colchicine and aluloric 4. Anemia  - while she is here we can give her another dose of iron and probably an ESA as well 5. Bones- last PTH 132 as an OP- no meds needed   Thank you for this consult. I will continue to follow with you   Facundo Allemand A 01/06/2016, 12:42 PM

## 2016-01-06 NOTE — H&P (Signed)
[facility] History and Physical  Sandy [name] [id number] DOB: 11-Dec-1975 DOA: 01/05/2016  PCP: No PCP Per Patient  Specialists: [facility]  Chief Complaint: Edema, shortness of breath  HPI: Sandy [name] is a 40 y.o. 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  She reports a 23lb weight gain over the past 7-10 days.  She has had atypical chest pain and shortness of breath.  She held her diuretic for two days because she developed bilateral foot pain that she thought was gout.  The foot pain resolved without specific intervention.  She tries to adhere to a 1.5L fluid restriction daily and a low sodium diet, but she admits that she traveled last week and had an increased amount of fast food.  She had vascular access placed in her left arm recently, but she has not needed hemodialysis thus far.  ED evaluation shows creatinine near her baseline, but she is acidotic.  She also has accelerated HTN, but she has missed some of her medications today.  Hospitalist asked to admit for diuresis.  Review of Systems: 12 systems reviewed and negative except as stated in the HPI.  Past Medical History  Diagnosis Date  . Hypertension   . Anemia   . 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 Anasarca associated with disorder of kidney    Past Surgical History  Procedure Laterality Date  . Cesarean section    . Renal biopsy    . Av fistula placement Left 11/24/2015    Procedure: ARTERIOVENOUS (AV) FISTULA CREATION-LEFT UPPER ARM;  Surgeon: Rosetta Posner, MD;  Location: [facility] OR;  Service: Vascular;  Laterality: Left;   Social History:  Social History   Social History Narrative  Still smokes cigarettes occasionally.  No EtOH or illicit drug use.  She is married.  She has one  daughter.  Allergies  Allergen Reactions  . Lisinopril Cough    Family History  Problem Relation Age of Onset  . Diabetes Father     Prior to Admission medications   Medication Sig Start Date End Date Taking? Authorizing Provider  acetaminophen (TYLENOL) 500 MG tablet Take 500 mg by mouth every 6 (six) hours as needed for moderate pain.   Yes Historical Provider, MD  hydrALAZINE (APRESOLINE) 50 MG tablet Take 50 mg by mouth 3 (three) times daily.   Yes Historical Provider, MD  labetalol (NORMODYNE) 200 MG tablet Take 1 tablet (200 mg total) by mouth 2 (two) times daily. 10/25/15  Yes Theodis Blaze, MD  metolazone (ZAROXOLYN) 10 MG tablet Take 1 tablet (10 mg total) by mouth 2 (two) times daily. 10/25/15  Yes Theodis Blaze, MD  predniSONE (DELTASONE) 5 MG tablet Take 5 mg by mouth daily with breakfast.   Yes Historical Provider, MD  sodium bicarbonate 650 MG tablet Take 1 tablet (650 mg total) by mouth 2 (two) times daily. 10/26/15  Yes Theodis Blaze, MD  spironolactone (ALDACTONE) 50 MG tablet Take 1 tablet (50 mg total) by mouth daily. 10/25/15  Yes Theodis Blaze, MD  torsemide (DEMADEX) 100 MG tablet Take 1 tablet (100 mg total) by mouth 2 (two) times daily. 10/25/15  Yes Theodis Blaze, MD   Physical Exam: Filed Vitals:   01/05/16 1945 01/05/16 2300 01/05/16 2315 01/06/16  0030  BP: 170/118 162/105 174/110 172/105  Pulse: 78  95 87  Temp:      TempSrc:      Resp: 19 14 17 16   Weight:      SpO2: 100%  100% 99%    General:  Awake and alert.  Oriented to person, place, time and situation.  NAD. Head: [place]/AT Eyes: PERRL bilaterally ENT: Mucous membranes are moist.  No nasal drainage.  Neck is supple. Cardiovascular: NR/RR.  2+ pitting edema in bilateral lower extremities. Respiratory: Diminished bilaterally, no wheeze GI: Abdomen is soft/NT/ND.  Bowel sounds are present.  No guarding. Skin: Warm and dry. Musculoskeletal: Moves all four extremities spontaneously.  No evidence of acute  gout in her feet. Psychiatric: Normal affect. Neurologic: No focal deficits.  Labs on Admission:  Basic Metabolic Panel:  Recent Labs Lab 01/05/16 1702  NA 143  K 4.7  CL 115*  CO2 17*  GLUCOSE 114*  BUN 61*  CREATININE 3.13*  CALCIUM 8.1*   Liver Function Tests:  Recent Labs Lab 01/05/16 1702  AST 15  ALT 9*  ALKPHOS 57  BILITOT 0.4  PROT 5.2*  ALBUMIN 1.5*   CBC:  Recent Labs Lab 01/05/16 1702  WBC 11.1*  HGB 9.1*  HCT 31.3*  MCV 94.6  PLT 333   BNP (last 3 results)  Recent Labs  10/14/15 1614 01/05/16 1838  BNP 201.7* 317.3*   Radiological Exams on Admission: Dg Chest 2 View  01/05/2016  CLINICAL DATA:  Bilateral foot edema and abdominal edema for 1 week. EXAM: CHEST  2 VIEW COMPARISON:  10/14/2015. FINDINGS: Trachea is midline. Heart size normal. Minimal subpleural atelectasis in the left upper lobe and the left lung base. Lungs are otherwise clear. Trace left pleural effusion. IMPRESSION: Minimal left basilar atelectasis and tiny left pleural effusion. Electronically Signed   By: Lorin Picket M.D.   On: 01/05/2016 17:11    EKG: Independently reviewed. NSR.  Biatrial enlargement.  No acute ST segment changes.  Assessment/Plan Principal Problem:   Hypervolemia Active Problems:   Anasarca associated with disorder of kidney   CKD (chronic kidney disease), stage IV (HCC)   Accelerated hypertension   Atypical chest pain   Volume overload  Admit to telemetry  Volume overload secondary to CKD, medical noncompliance --Diuresis with IV lasix --She will likely need nephrology consult in the morning --Follow BMP for potassium, bicarb, and BUN/Cr trends --Compression hose --1200cc fluid restriction --Daily weights --Strict I/O  Atypical chest pain --Serial troponin --Echo  Accelerated HTN --Continue home medications --IV hydralazine prn  Code Status: FULL Family Communication: Daughter at bedside Disposition Plan: To be determined  based on response to diuretics, but I expect she will be here 3-4 days to achieve effective diuresis.  Time spent: 60 minutes  [employer] [facility]  01/06/2016, 1:13 AM

## 2016-01-07 ENCOUNTER — Inpatient Hospital Stay (HOSPITAL_COMMUNITY): Payer: Medicaid Other

## 2016-01-07 DIAGNOSIS — R0600 Dyspnea, unspecified: Secondary | ICD-10-CM

## 2016-01-07 LAB — RENAL FUNCTION PANEL
Albumin: 1.3 g/dL — ABNORMAL LOW (ref 3.5–5.0)
Anion gap: 11 (ref 5–15)
BUN: 61 mg/dL — ABNORMAL HIGH (ref 6–20)
CALCIUM: 8.2 mg/dL — AB (ref 8.9–10.3)
CO2: 16 mmol/L — AB (ref 22–32)
CREATININE: 3.38 mg/dL — AB (ref 0.44–1.00)
Chloride: 115 mmol/L — ABNORMAL HIGH (ref 101–111)
GFR calc non Af Amer: 16 mL/min — ABNORMAL LOW (ref >60)
GFR, EST AFRICAN AMERICAN: 19 mL/min — AB (ref >60)
GLUCOSE: 85 mg/dL (ref 65–99)
Phosphorus: 7 mg/dL — ABNORMAL HIGH (ref 2.5–4.6)
Potassium: 4.3 mmol/L (ref 3.5–5.1)
SODIUM: 142 mmol/L (ref 135–145)

## 2016-01-07 LAB — ECHOCARDIOGRAM COMPLETE
HEIGHTINCHES: 66 in
WEIGHTICAEL: 3640.24 [oz_av]

## 2016-01-07 LAB — URIC ACID: URIC ACID, SERUM: 8.3 mg/dL — AB (ref 2.3–6.6)

## 2016-01-07 MED ORDER — DEXTROSE 5 % IV SOLN
1.5000 g | INTRAVENOUS | Status: AC
  Administered 2016-01-08: 1.5 g via INTRAVENOUS
  Filled 2016-01-07: qty 1.5

## 2016-01-07 MED ORDER — CEFAZOLIN SODIUM 1-5 GM-% IV SOLN: 1.0000 g | INTRAVENOUS | Status: DC

## 2016-01-07 NOTE — Progress Notes (Signed)
[facility] PROGRESS NOTE  Sandy [name] [id number] DOB: April 02, 1976 DOA: 01/05/2016 PCP: No PCP Per Patient  Assessment/Plan: Sandy [name] is a 40 y.o. 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 She reports a 23lb weight gain over the past 7-10 days. She has had atypical chest pain and shortness of breath. She held her diuretic for two days because she developed bilateral foot pain that she thought was gout. The foot pain resolved without specific intervention. She tries to adhere to a 1.5L fluid restriction daily and a low sodium diet, but she admits that she traveled last week and had an increased amount of fast food. She had vascular access placed in her left arm recently, but she has not needed hemodialysis thus far.  Anasarca, nephrotic syndrome, FSGS, CKD stage 4 , medical noncompliance --Diuresis with IV lasix   every 6 hours.  --Compression hose --1200cc fluid restriction --Daily weights --Strict I/O --nephrology consulted.  Plan to start HD [id number]  Metabolic acidosis:  Increase sodium bicarb to TID.   Atypical chest pain --troponin, first negative --Echo EF 60 % --EKG; peak T wave.   Accelerated HTN --Continue home medications --IV hydralazine prn --Hydralazine, labetalol, lasix,   Code Status: Full code.  Family Communication: care discussed with patient.  Disposition Plan: remain inpatient for tx anasarca/    Consultants:  Nephrology   Procedures:  none  Antibiotics:  none  HPI/Subjective: Still with significant LE edema.  No new complaints,.   Objective: Filed Vitals:   01/07/16 0543 01/07/16 0740  BP: 146/86 151/95  Pulse: 91 97  Temp: 98.4 F (36.9 C) 98.2 F (36.8 C)  Resp: 20 19    Intake/Output Summary (Last 24 hours) at 01/07/16 1535 Last data filed at 01/07/16  0741  Gross per 24 hour  Intake    740 ml  Output   1750 ml  Net  -1010 ml   Filed Weights   01/05/16 1649 01/06/16 0144 01/06/16 2030  Weight: 104.237 kg (229 lb 12.8 oz) 103.647 kg (228 lb 8 oz) 103.2 kg (227 lb 8.2 oz)    Exam:   General:  Alert in no distress  Cardiovascular: S 1, S 2 RRR  Respiratory: CTA  Abdomen: BS present, soft, nt  Musculoskeletal: plus 2 edema.   Data Reviewed: Basic Metabolic Panel:  Recent Labs Lab 01/05/16 1702 01/06/16 0352 01/07/16 0431  NA 143 143 142  K 4.7 4.2 4.3  CL 115* 117* 115*  CO2 17* 17* 16*  GLUCOSE 114* 84 85  BUN 61* 61* 61*  CREATININE 3.13* 3.26* 3.38*  CALCIUM 8.1* 7.8* 8.2*  PHOS  --   --  7.0*   Liver Function Tests:  Recent Labs Lab 01/05/16 1702 01/07/16 0431  AST 15  --   ALT 9*  --   ALKPHOS 57  --   BILITOT 0.4  --   PROT 5.2*  --   ALBUMIN 1.5* 1.3*   No results for input(s): LIPASE, AMYLASE in the last 168 hours. No results for input(s): AMMONIA in the last 168 hours. CBC:  Recent Labs Lab 01/05/16 1702 01/06/16 0352  WBC 11.1* 10.8*  HGB 9.1* 8.3*  HCT 31.3* 27.8*  MCV 94.6 94.2  PLT 333 274   Cardiac Enzymes:  Recent Labs Lab 01/06/16 0351 01/06/16 0749 01/06/16 1335  TROPONINI <0.03 <0.03 <  0.03   BNP (last 3 results)  Recent Labs  10/14/15 1614 01/05/16 1838  BNP 201.7* 317.3*    ProBNP (last 3 results) No results for input(s): PROBNP in the last 8760 hours.  CBG:  Recent Labs Lab 01/06/16 2308  GLUCAP 115*    No results found for this or any previous visit (from the past 240 hour(s)).   Studies: Dg Chest 2 View  01/05/2016  CLINICAL DATA:  Bilateral foot edema and abdominal edema for 1 week. EXAM: CHEST  2 VIEW COMPARISON:  10/14/2015. FINDINGS: Trachea is midline. Heart size normal. Minimal subpleural atelectasis in the left upper lobe and the left lung base. Lungs are otherwise clear. Trace left pleural effusion. IMPRESSION: Minimal left basilar  atelectasis and tiny left pleural effusion. Electronically Signed   By: Lorin Picket M.D.   On: 01/05/2016 17:11    Scheduled Meds: . colchicine  0.6 mg Oral Daily  . darbepoetin (ARANESP) injection - NON-DIALYSIS  100 mcg Subcutaneous Q Sat-1800  . febuxostat  40 mg Oral Daily  . ferric gluconate (FERRLECIT/NULECIT) IV  125 mg Intravenous Daily  . furosemide  160 mg Intravenous Q4H  . heparin  5,000 Units Subcutaneous 3 times per day  . hydrALAZINE  50 mg Oral 3 times per day  . labetalol  200 mg Oral BID  . metolazone  10 mg Oral Daily  . predniSONE  5 mg Oral Q breakfast  . sodium bicarbonate  650 mg Oral TID  . sodium chloride flush  3 mL Intravenous Q12H   Continuous Infusions:   Principal Problem:   Hypervolemia Active Problems:   Anasarca associated with disorder of kidney   CKD (chronic kidney disease), stage IV (HCC)   Accelerated hypertension   Atypical chest pain   Volume overload    Time spent: 35 minutes.     Niel Hummer A  [facility] Pager [phone]. If 7PM-7AM, please contact night-coverage at [email or web address], password [facility] 01/07/2016, 3:35 PM  LOS: 2 days

## 2016-01-07 NOTE — Consult Note (Signed)
Consult Note  Patient name: Sandy [name] MRN: [id number] DOB: 07/31/1976 Sex: female  Consulting Physician:  Dr. Moshe Cipro  Reason for Consult:  Chief Complaint  Patient presents with  . 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  She has previously undergone a left brachiocephalic fistula by Dr. early on 11/24/2015.  She was seen in the office on 11/29/2015 with pain and swelling at the site.  This appears to have resolved.  At the office visit, the fistula was not palpated but had a Doppler signal.  I have been asked to place a dialysis access and to reevaluate her fistula.  She is right-handed  Past Medical History  Diagnosis Date  . Hypertension   . Anemia   . 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 Anasarca associated with disorder of kidney     Past Surgical History  Procedure Laterality Date  . Cesarean section    . Renal biopsy    . Av fistula placement Left 11/24/2015    Procedure: ARTERIOVENOUS (AV) FISTULA CREATION-LEFT UPPER ARM;  Surgeon: Rosetta Posner, MD;  Location: [facility] OR;  Service: Vascular;  Laterality: Left;    Social History   Social History  . Marital Status: Single    Spouse Name: N/A  . Number of Children: N/A  . Years of Education: N/A   Occupational History  . Not on file.   Social History Main Topics  . Smoking status: Former Smoker -- 0.25 packs/day    Types: Cigarettes    Quit date: 12/23/2015  . Smokeless tobacco: Never Used  . Alcohol Use: No  . Drug Use: No  . Sexual Activity: Not on file   Other Topics Concern  . Not on file   Social History Narrative    Family History  Problem Relation Age of Onset  . Diabetes Father     Allergies as of 01/05/2016 - Review Complete 01/05/2016  Allergen Reaction Noted  . Lisinopril Cough 10/14/2015    No current facility-administered medications on file prior to  encounter.   Current Outpatient Prescriptions on File Prior to Encounter  Medication Sig Dispense Refill  . acetaminophen (TYLENOL) 500 MG tablet Take 500 mg by mouth every 6 (six) hours as needed for moderate pain.    . hydrALAZINE (APRESOLINE) 50 MG tablet Take 50 mg by mouth 3 (three) times daily.    Marland Kitchen labetalol (NORMODYNE) 200 MG tablet Take 1 tablet (200 mg total) by mouth 2 (two) times daily. 60 tablet 1  . metolazone (ZAROXOLYN) 10 MG tablet Take 1 tablet (10 mg total) by mouth 2 (two) times daily. 60 tablet 1  . sodium bicarbonate 650 MG tablet Take 1 tablet (650 mg total) by mouth 2 (two) times daily. 60 tablet 1  . spironolactone (ALDACTONE) 50 MG tablet Take 1 tablet (50 mg total) by mouth daily. 30 tablet 1  . torsemide (DEMADEX) 100 MG tablet Take 1 tablet (100 mg total) by mouth 2 (two) times daily. 60 tablet 0     REVIEW OF SYSTEMS: Cardiovascular: Positive for chest pain and lower extremity swelling,  No history of DVT or phlebitis. Pulmonary: No productive cough, asthma or wheezing. Neurologic: No weakness, paresthesias, aphasia, or amaurosis. No dizziness. Hematologic: No bleeding problems or clotting disorders. Musculoskeletal: Positive for arthralgia Gastrointestinal: No blood in stool or hematemesis  Genitourinary: No dysuria or hematuria. Psychiatric:: No history of major depression. Integumentary: No rashes or ulcers. Constitutional: Positive for fatigue  PHYSICAL EXAMINATION: General: The patient appears their stated age.  Vital signs are BP 151/95 mmHg  Pulse 97  Temp(Src) 98.2 F (36.8 C) (Oral)  Resp 19  Ht 5\' 6"  (1.676 m)  Wt 227 lb 8.2 oz (103.2 kg)  BMI 36.74 kg/m2  SpO2 97% Pulmonary: Respirations are non-labored HEENT:  No gross abnormalities Musculoskeletal: There are no major deformities.   Neurologic: No focal weakness or paresthesias are detected, Skin: There are no ulcer or rashes noted. Psychiatric: The patient has normal  affect. Cardiovascular: There is a regular rate and rhythm.  I cannot palpate a thrill within her left brachiocephalic fistula.  No significant pain or swelling in this area  Diagnostic Studies: I have reviewed her vein mapping from her prior hospitalization: +--------+-----------+--------+------+---------------+------------+ Vein Location DiameterDepth Observations Comments  +--------+-----------+--------+------+---------------+------------+ CephalicProximal 3.1 mm [id number] ---------------Patent   right upper mm  Compressible  arm      +--------+-----------+--------+------+---------------+------------+ CephalicMid right [id number] mm 6.16 ---------------Patent   upper arm  mm  Compressible +--------+-----------+--------+------+---------------+------------+ CephalicAbove [id number] mm 4.15 Partial Partially   AC  mm thrombosis compressible +--------+-----------+--------+------+---------------+------------+ CephalicRight AC Q000111Q mm [id number] Partial Partially     mm thrombosis, compressible     branch   +--------+-----------+--------+------+---------------+------------+ CephalicBelow right--------------IV ------------  AC      +--------+-----------+--------+------+---------------+------------+ CephalicMid right --------------Total Non   forearm   thrombosis compressible +--------+-----------+--------+------+---------------+------------+ CephalicRight  wrist----------------------------------------- +--------+-----------+--------+------+---------------+------------+ Basilic Proximal Q000111Q mm 10.3 ---------------Patent   right upper mm  Compressible  arm      +--------+-----------+--------+------+---------------+------------+ Basilic Mid right XX123456 mm 17.9 ---------------Patent   upper arm  mm  Compressible +--------+-----------+--------+------+---------------+------------+ Basilic Above [id number] mm 12.6 ---------------Patent   AC  mm  Compressible +--------+-----------+--------+------+---------------+------------+ Basilic Right AC Q000111Q mm 10.5 ---------------Patent     mm  Compressible +--------+-----------+--------+------+---------------+------------+ Basilic Below right-----------------------------------------  AC      +--------+-----------+--------+------+---------------+------------+ Basilic Mid right -----------------------------------------  forearm      +--------+-----------+--------+------+---------------+------------+ Basilic Right wrist----------------------------------------- +--------+-----------+--------+------+---------------+------------+ CephalicProximal Q000111Q mm 123XX123 ---------------Patent   left upper  mm  Compressible  arm      +--------+-----------+--------+------+---------------+------------+ CephalicMid left A999333 mm [id number] ---------------Patent   upper arm   mm  Compressible +--------+-----------+--------+------+---------------+------------+ CephalicAbove left 123XX123 mm 5.02 ---------------Patent   AC  mm  Compressible +--------+-----------+--------+------+---------------+------------+ CephalicLeft AC Q000111Q mm [id number] ---------------Patent     mm  Compressible +--------+-----------+--------+------+---------------+------------+ CephalicBelow left A999333 mm 6.12 ---------------Patent   AC  mm  Compressible +--------+-----------+--------+------+---------------+------------+ CephalicMid left XX123456 mm XX123456 ---------------Patent   forearm  mm  Compressible +--------+-----------+--------+------+---------------+------------+ CephalicLeft wrist [id number] mm 4.75 ---------------Patent     mm  Compressible +--------+-----------+--------+------+---------------+------------+ Basilic Proximal A999333 mm 13.1 ---------------Patent   left upper  mm  Compressible  arm      +--------+-----------+--------+------+---------------+------------+ Basilic Mid left 2.1 mm 15 mm ---------------Patent   upper arm    Compressible +--------+-----------+--------+------+---------------+------------+ Basilic Above left 2.1 mm 15 mm ---------------Patent   AC    Compressible +--------+-----------+--------+------+---------------+------------+ Basilic Left AC A999333 mm 123456 ---------------Patent     mm   Compressible +--------+-----------+--------+------+---------------+------------+ Basilic Below left 123XX123 mm 6.16 ---------------Patent   AC  mm  Compressible +--------+-----------+--------+------+---------------+------------+    Assessment:  Renal failure needing dialysis Plan: I am ordering a duplex of her fistula to see if it remains patent or whether or not it has occluded.  If it is occluded I discussed proceeding with a possible basilic vein transposition on the left or a Gore-Tex graft.  The other option  would be to place a right brachiocephalic fistula.  This decision will be made pending her fistula duplex.  I also discussed placing a catheter.  This will be scheduled for tomorrow     V. Leia Alf, M.D. [facility] Office: [phone] Pager:  [phone]

## 2016-01-07 NOTE — Progress Notes (Signed)
Subjective:  No new complaints- did not diurese a whole lot Objective Vital signs in last 24 hours: Filed Vitals:   01/06/16 1741 01/06/16 2030 01/07/16 0543 01/07/16 0740  BP: 132/80 155/90 146/86 151/95  Pulse: 91 90 91 97  Temp: 98 F (36.7 C) 98.2 F (36.8 C) 98.4 F (36.9 C) 98.2 F (36.8 C)  TempSrc: Oral Oral Oral Oral  Resp: 18 19 20 19   Height:      Weight:  103.2 kg (227 lb 8.2 oz)    SpO2: 100% 100% 100% 97%   Weight change: -1.036 kg (-2 lb 4.6 oz)  Intake/Output Summary (Last 24 hours) at 01/07/16 1002 Last data filed at 01/07/16 0741  Gross per 24 hour  Intake    980 ml  Output   2050 ml  Net  -1070 ml    Assessment/Plan: 40 year old black female with a prolonged course with FSG that's been refractory to treatment now presenting with worsening volume status in the setting of weaning steroids and also uremic symptoms that will necessitate initiation of dialysis  1.Renal- advanced CKD secondary to FSG and also volume overload that's refractory to treatment. Plans were being made to initiate dialysis in the outpatient setting. We will plan to go ahead and initiate dialysis while she is here. Because there is no acute indications and is a holiday weekend we'll see if we can get a PermCath placed on Monday followed by initiation of dialysis- appreciate VVS assist. PC and first HD tomorrow.  AVF placed 3/3 without thrill to my exam- VVS to eval for further intervention need  2. Hypertension/volume - massive volume overload.  max diuretics with really only holding own- no significant diuresis. Ultimate plan of getting her started on dialysis. First dialysis treatment Monday. She is on labetalol for now is okay to continue 3. Pains in her legs- she did have a uric acid of close to 11 in the past. I'm not sure if this is gout or just leg cramps related to CKD. We'll check another uric acid level and also start her on some colchicine and uloric 4. Anemia - while she is here  we can give her another dose of iron and probably an ESA as well 5. Bones- last PTH 132 as an OP- no meds needed     Sandy [name] A    Labs: Basic Metabolic Panel:  Recent Labs Lab 01/05/16 1702 01/06/16 0352 01/07/16 0431  NA 143 143 142  K 4.7 4.2 4.3  CL 115* 117* 115*  CO2 17* 17* 16*  GLUCOSE 114* 84 85  BUN 61* 61* 61*  CREATININE 3.13* 3.26* 3.38*  CALCIUM 8.1* 7.8* 8.2*  PHOS  --   --  7.0*   Liver Function Tests:  Recent Labs Lab 01/05/16 1702 01/07/16 0431  AST 15  --   ALT 9*  --   ALKPHOS 57  --   BILITOT 0.4  --   PROT 5.2*  --   ALBUMIN 1.5* 1.3*   No results for input(s): LIPASE, AMYLASE in the last 168 hours. No results for input(s): AMMONIA in the last 168 hours. CBC:  Recent Labs Lab 01/05/16 1702 01/06/16 0352  WBC 11.1* 10.8*  HGB 9.1* 8.3*  HCT 31.3* 27.8*  MCV 94.6 94.2  PLT 333 274   Cardiac Enzymes:  Recent Labs Lab 01/06/16 0351 01/06/16 0749 01/06/16 1335  TROPONINI <0.03 <0.03 <0.03   CBG:  Recent Labs Lab 01/06/16 2308  GLUCAP 115*    Iron Studies:  No results for input(s): IRON, TIBC, TRANSFERRIN, FERRITIN in the last 72 hours. Studies/Results: Dg Chest 2 View  01/05/2016  CLINICAL DATA:  Bilateral foot edema and abdominal edema for 1 week. EXAM: CHEST  2 VIEW COMPARISON:  10/14/2015. FINDINGS: Trachea is midline. Heart size normal. Minimal subpleural atelectasis in the left upper lobe and the left lung base. Lungs are otherwise clear. Trace left pleural effusion. IMPRESSION: Minimal left basilar atelectasis and tiny left pleural effusion. Electronically Signed   By: Lorin Picket M.D.   On: 01/05/2016 17:11   Medications: Infusions:    Scheduled Medications: . colchicine  0.6 mg Oral Daily  . darbepoetin (ARANESP) injection - NON-DIALYSIS  100 mcg Subcutaneous Q Sat-1800  . febuxostat  40 mg Oral Daily  . ferric gluconate (FERRLECIT/NULECIT) IV  125 mg Intravenous Daily  . furosemide  160 mg  Intravenous Q4H  . heparin  5,000 Units Subcutaneous 3 times per day  . hydrALAZINE  50 mg Oral 3 times per day  . labetalol  200 mg Oral BID  . metolazone  10 mg Oral Daily  . predniSONE  5 mg Oral Q breakfast  . sodium bicarbonate  650 mg Oral TID  . sodium chloride flush  3 mL Intravenous Q12H    have reviewed scheduled and prn medications.  Physical Exam: General: alert, NAD Heart: RRR Lungs: decreased BS at bases Abdomen: soft, non tender Extremities: pitting edema Dialysis Access: AVF in left upper arm- no thrill or bruit    01/07/2016,10:02 AM  LOS: 2 days

## 2016-01-07 NOTE — Progress Notes (Signed)
Pt refusing IV lasix today as she thinks it is contributing to her headache. Educated pt in detail on the consequences of not taking lasix and she is still refusing currently. Attending MD and Nephrology MD notified.

## 2016-01-07 NOTE — Progress Notes (Signed)
  Echocardiogram 2D Echocardiogram has been performed.  Sandy [name] 01/07/2016, 10:46 AM

## 2016-01-08 ENCOUNTER — Inpatient Hospital Stay (HOSPITAL_COMMUNITY): Payer: Medicaid Other

## 2016-01-08 ENCOUNTER — Inpatient Hospital Stay (HOSPITAL_COMMUNITY): Payer: Medicaid Other | Admitting: Anesthesiology

## 2016-01-08 ENCOUNTER — Encounter (HOSPITAL_COMMUNITY)
Admission: EM | Disposition: A | Discharge status: Home / Self Care | Payer: Self-pay | Source: Home / Self Care | Attending: Internal Medicine

## 2016-01-08 DIAGNOSIS — N185 Chronic kidney disease, stage 5: Secondary | ICD-10-CM

## 2016-01-08 DIAGNOSIS — N186 End stage renal disease: Secondary | ICD-10-CM

## 2016-01-08 HISTORY — PX: INSERTION OF DIALYSIS CATHETER: SHX1324

## 2016-01-08 LAB — RENAL FUNCTION PANEL
ALBUMIN: 1.4 g/dL — AB (ref 3.5–5.0)
Anion gap: 13 (ref 5–15)
BUN: 61 mg/dL — AB (ref 6–20)
CHLORIDE: 111 mmol/L (ref 101–111)
CO2: 17 mmol/L — ABNORMAL LOW (ref 22–32)
CREATININE: 3.63 mg/dL — AB (ref 0.44–1.00)
Calcium: 8.5 mg/dL — ABNORMAL LOW (ref 8.9–10.3)
GFR, EST AFRICAN AMERICAN: 17 mL/min — AB (ref >60)
GFR, EST NON AFRICAN AMERICAN: 15 mL/min — AB (ref >60)
Glucose, Bld: 84 mg/dL (ref 65–99)
PHOSPHORUS: 7.3 mg/dL — AB (ref 2.5–4.6)
Potassium: 4.1 mmol/L (ref 3.5–5.1)
Sodium: 141 mmol/L (ref 135–145)

## 2016-01-08 LAB — SURGICAL PCR SCREEN
MRSA, PCR: NEGATIVE
Staphylococcus aureus: POSITIVE — AB

## 2016-01-08 LAB — CBC
HCT: 28.6 % — ABNORMAL LOW (ref 36.0–46.0)
Hemoglobin: 8.7 g/dL — ABNORMAL LOW (ref 12.0–15.0)
MCH: 28.5 pg (ref 26.0–34.0)
MCHC: 30.4 g/dL (ref 30.0–36.0)
MCV: 93.8 fL (ref 78.0–100.0)
PLATELETS: 308 10*3/uL (ref 150–400)
RBC: 3.05 MIL/uL — AB (ref 3.87–5.11)
RDW: 16.1 % — AB (ref 11.5–15.5)
WBC: 10 10*3/uL (ref 4.0–10.5)

## 2016-01-08 LAB — PROTIME-INR
INR: 0.96 (ref 0.00–1.49)
PROTHROMBIN TIME: 13 s (ref 11.6–15.2)

## 2016-01-08 LAB — HEPATITIS B SURFACE ANTIGEN
HEP B S AG: NEGATIVE
HEP B S AG: NEGATIVE

## 2016-01-08 SURGERY — INSERTION OF DIALYSIS CATHETER
Anesthesia: Monitor Anesthesia Care | Site: Neck | Laterality: Right

## 2016-01-08 MED ORDER — HEPARIN SODIUM (PORCINE) 1000 UNIT/ML DIALYSIS: 1000.0000 [IU] | INTRAMUSCULAR | Status: DC | PRN

## 2016-01-08 MED ORDER — LIDOCAINE HCL (CARDIAC) 20 MG/ML IV SOLN
INTRAVENOUS | Status: AC
  Filled 2016-01-08: qty 5

## 2016-01-08 MED ORDER — MUPIROCIN 2 % EX OINT
1.0000 "application " | TOPICAL_OINTMENT | Freq: Two times a day (BID) | CUTANEOUS | Status: AC
  Administered 2016-01-08 – 2016-01-12 (×8): 1 via NASAL
  Filled 2016-01-08 (×2): qty 22

## 2016-01-08 MED ORDER — HEPARIN SODIUM (PORCINE) 1000 UNIT/ML IJ SOLN
INTRAMUSCULAR | Status: DC | PRN
  Administered 2016-01-08: 1000 [IU]

## 2016-01-08 MED ORDER — FENTANYL CITRATE (PF) 100 MCG/2ML IJ SOLN
INTRAMUSCULAR | Status: DC | PRN
  Administered 2016-01-08 (×3): 50 ug via INTRAVENOUS

## 2016-01-08 MED ORDER — PROPOFOL 500 MG/50ML IV EMUL
INTRAVENOUS | Status: DC | PRN
  Administered 2016-01-08: 25 ug/kg/min via INTRAVENOUS

## 2016-01-08 MED ORDER — 0.9 % SODIUM CHLORIDE (POUR BTL) OPTIME
TOPICAL | Status: DC | PRN
  Administered 2016-01-08: 1000 mL

## 2016-01-08 MED ORDER — HYDROCODONE-ACETAMINOPHEN 5-325 MG PO TABS
ORAL_TABLET | ORAL | Status: AC
  Filled 2016-01-08: qty 1

## 2016-01-08 MED ORDER — LIDOCAINE HCL (PF) 1 % IJ SOLN: 5.0000 mL | INTRAMUSCULAR | Status: DC | PRN

## 2016-01-08 MED ORDER — IOPAMIDOL (ISOVUE-300) INJECTION 61%
INTRAVENOUS | Status: AC
  Filled 2016-01-08: qty 50

## 2016-01-08 MED ORDER — PENTAFLUOROPROP-TETRAFLUOROETH EX AERO: 1.0000 "application " | INHALATION_SPRAY | CUTANEOUS | Status: DC | PRN

## 2016-01-08 MED ORDER — LIDOCAINE HCL (CARDIAC) 20 MG/ML IV SOLN
INTRAVENOUS | Status: DC | PRN
  Administered 2016-01-08: 60 mg via INTRAVENOUS

## 2016-01-08 MED ORDER — ALTEPLASE 2 MG IJ SOLR: 2.0000 mg | Freq: Once | INTRAMUSCULAR | Status: DC | PRN

## 2016-01-08 MED ORDER — MIDAZOLAM HCL 2 MG/2ML IJ SOLN
INTRAMUSCULAR | Status: AC
  Filled 2016-01-08: qty 2

## 2016-01-08 MED ORDER — CHLORHEXIDINE GLUCONATE CLOTH 2 % EX PADS
6.0000 | MEDICATED_PAD | Freq: Every day | CUTANEOUS | Status: AC
  Administered 2016-01-08: 6 via TOPICAL

## 2016-01-08 MED ORDER — PROPOFOL 10 MG/ML IV BOLUS
INTRAVENOUS | Status: AC
  Filled 2016-01-08: qty 20

## 2016-01-08 MED ORDER — LIDOCAINE-EPINEPHRINE 0.5 %-1:200000 IJ SOLN
INTRAMUSCULAR | Status: AC
  Filled 2016-01-08: qty 1

## 2016-01-08 MED ORDER — SODIUM CHLORIDE 0.9 % IV SOLN: 100.0000 mL | INTRAVENOUS | Status: DC | PRN

## 2016-01-08 MED ORDER — SODIUM CHLORIDE 0.9 % IV SOLN
INTRAVENOUS | Status: DC | PRN
  Administered 2016-01-08: 12:00:00

## 2016-01-08 MED ORDER — MIDAZOLAM HCL 5 MG/5ML IJ SOLN
INTRAMUSCULAR | Status: DC | PRN
  Administered 2016-01-08 (×2): 2 mg via INTRAVENOUS

## 2016-01-08 MED ORDER — HEPARIN SODIUM (PORCINE) 1000 UNIT/ML IJ SOLN
INTRAMUSCULAR | Status: AC
  Filled 2016-01-08: qty 1

## 2016-01-08 MED ORDER — SODIUM CHLORIDE 0.9 % IV SOLN
INTRAVENOUS | Status: DC
  Administered 2016-01-08 – 2016-01-17 (×6): via INTRAVENOUS

## 2016-01-08 MED ORDER — LIDOCAINE-PRILOCAINE 2.5-2.5 % EX CREA: 1.0000 "application " | TOPICAL_CREAM | CUTANEOUS | Status: DC | PRN

## 2016-01-08 MED ORDER — LIDOCAINE-EPINEPHRINE 0.5 %-1:200000 IJ SOLN
INTRAMUSCULAR | Status: DC | PRN
  Administered 2016-01-08: 50 mL

## 2016-01-08 MED ORDER — ONDANSETRON HCL 4 MG/2ML IJ SOLN
INTRAMUSCULAR | Status: AC
  Administered 2016-01-08: 4 mg via INTRAVENOUS
  Filled 2016-01-08: qty 2

## 2016-01-08 MED ORDER — FENTANYL CITRATE (PF) 250 MCG/5ML IJ SOLN
INTRAMUSCULAR | Status: AC
  Filled 2016-01-08: qty 5

## 2016-01-08 SURGICAL SUPPLY — 44 items
BAG BANDED W/RUBBER/TAPE 36X54 (MISCELLANEOUS) ×3 IMPLANT
BAG DECANTER FOR FLEXI CONT (MISCELLANEOUS) ×3 IMPLANT
BENZOIN TINCTURE PRP APPL 2/3 (GAUZE/BANDAGES/DRESSINGS) IMPLANT
BIOPATCH RED 1 DISK 7.0 (GAUZE/BANDAGES/DRESSINGS) ×2 IMPLANT
BIOPATCH RED 1IN DISK 7.0MM (GAUZE/BANDAGES/DRESSINGS) ×1
CATH PALINDROME RT-P 15FX19CM (CATHETERS) IMPLANT
CATH PALINDROME RT-P 15FX23CM (CATHETERS) ×3 IMPLANT
CATH PALINDROME RT-P 15FX28CM (CATHETERS) IMPLANT
CATH PALINDROME RT-P 15FX55CM (CATHETERS) IMPLANT
CLOSURE WOUND 1/2 X4 (GAUZE/BANDAGES/DRESSINGS)
COVER DOME SNAP 22 D (MISCELLANEOUS) ×3 IMPLANT
COVER PROBE W GEL 5X96 (DRAPES) ×3 IMPLANT
DECANTER SPIKE VIAL GLASS SM (MISCELLANEOUS) ×3 IMPLANT
DRAPE C-ARM 42X72 X-RAY (DRAPES) ×3 IMPLANT
DRAPE CHEST BREAST 15X10 FENES (DRAPES) ×3 IMPLANT
DRSG TEGADERM 4X4.75 (GAUZE/BANDAGES/DRESSINGS) ×3 IMPLANT
GAUZE SPONGE 2X2 8PLY STRL LF (GAUZE/BANDAGES/DRESSINGS) ×1 IMPLANT
GAUZE SPONGE 4X4 16PLY XRAY LF (GAUZE/BANDAGES/DRESSINGS) ×3 IMPLANT
GLOVE BIOGEL PI IND STRL 6.5 (GLOVE) ×1 IMPLANT
GLOVE BIOGEL PI INDICATOR 6.5 (GLOVE) ×2
GLOVE ECLIPSE 7.5 STRL STRAW (GLOVE) ×3 IMPLANT
GLOVE SS BIOGEL STRL SZ 7.5 (GLOVE) ×2 IMPLANT
GLOVE SUPERSENSE BIOGEL SZ 7.5 (GLOVE) ×4
GOWN STRL REUS W/ TWL LRG LVL3 (GOWN DISPOSABLE) ×2 IMPLANT
GOWN STRL REUS W/TWL LRG LVL3 (GOWN DISPOSABLE) ×4
KIT BASIN OR (CUSTOM PROCEDURE TRAY) ×3 IMPLANT
KIT ROOM TURNOVER OR (KITS) ×3 IMPLANT
NEEDLE 18GX1X1/2 (RX/OR ONLY) (NEEDLE) ×3 IMPLANT
NEEDLE 22X1 1/2 (OR ONLY) (NEEDLE) ×3 IMPLANT
NEEDLE HYPO 25GX1X1/2 BEV (NEEDLE) ×3 IMPLANT
NS IRRIG 1000ML POUR BTL (IV SOLUTION) ×3 IMPLANT
PACK CV ACCESS (CUSTOM PROCEDURE TRAY) ×3 IMPLANT
PACK SURGICAL SETUP 50X90 (CUSTOM PROCEDURE TRAY) ×3 IMPLANT
PAD ARMBOARD 7.5X6 YLW CONV (MISCELLANEOUS) ×6 IMPLANT
SOAP 2 % CHG 4 OZ (WOUND CARE) ×3 IMPLANT
SPONGE GAUZE 2X2 STER 10/PKG (GAUZE/BANDAGES/DRESSINGS) ×2
STRIP CLOSURE SKIN 1/2X4 (GAUZE/BANDAGES/DRESSINGS) IMPLANT
SUT ETHILON 3 0 PS 1 (SUTURE) ×3 IMPLANT
SUT VICRYL 4-0 PS2 18IN ABS (SUTURE) ×3 IMPLANT
SYR 20CC LL (SYRINGE) ×3 IMPLANT
SYR 5ML LL (SYRINGE) ×6 IMPLANT
SYR CONTROL 10ML LL (SYRINGE) ×3 IMPLANT
SYRINGE 10CC LL (SYRINGE) ×3 IMPLANT
WATER STERILE IRR 1000ML POUR (IV SOLUTION) ×3 IMPLANT

## 2016-01-08 NOTE — Progress Notes (Signed)
Duplex Dialysis Access has been completed.  Landry Mellow, RDMS, RVT 01/08/2016

## 2016-01-08 NOTE — Transfer of Care (Signed)
Immediate Anesthesia Transfer of Care Note  Patient: Sandy [name]  Procedure(s) Performed: Procedure(s): INSERTION OF DIALYSIS CATHETER L (Right)  Patient Location: PACU  Anesthesia Type:MAC  Level of Consciousness: awake, alert  and oriented  Airway & Oxygen Therapy: Patient Spontanous Breathing  Post-op Assessment: Report given to RN, Post -op Vital signs reviewed and stable and Patient moving all extremities  Post vital signs: Reviewed and stable  Last Vitals:  Filed Vitals:   01/08/16 0538 01/08/16 0834  BP: 179/93 165/100  Pulse: 98 93  Temp: 37 C 37 C  Resp: 19 18    Complications: No apparent anesthesia complications

## 2016-01-08 NOTE — Op Note (Signed)
    OPERATIVE REPORT  DATE OF SURGERY: 01/08/2016  PATIENT: Sandy [name], 40 y.o. female MRN: [id number]  DOB: August 09, 1976  PRE-OPERATIVE DIAGNOSIS: End-stage renal disease   POST-OPERATIVE DIAGNOSIS:  Same  PROCEDURE: Right IJ hemodialysis catheter placement SonoSite ultrasound visualization  SURGEON:  Curt Jews, M.D.  PHYSICIAN ASSISTANT: Nurse  ANESTHESIA:  Local with sedation  EBL: Minimal ml  Total I/O In: 320 [I.V.:320] Out: 605 [Urine:600; Blood:5]  BLOOD ADMINISTERED: None  DRAINS: None  SPECIMEN: None  COUNTS CORRECT:  YES  PLAN OF CARE: PACU with chest x-ray pending   PATIENT DISPOSITION:  PACU - hemodynamically stable  PROCEDURE DETAILS: Patient status post left upper arm AV fistula creation by myself in early March 2017. She is now progressed to end-stage renal disease and needs hemodialysis catheter for dialysis access. She has poor maturation of her left arm AV fistula. This is patent. It runs deep and does not have a good thrill. I imaged her right arm and she has small upper arm cephalic vein. Antecubital vein is moderate size. Basilic vein is not good on either right or left arm. Have recommended placement of a hemodialysis catheter for acute dialysis and improvement of respiratory status. Will make further plans regarding further evaluation of her left arm fistula once initiation of hemodialysis has occurred.  Patient was placed in Trendelenburg position. The right and left neck and chest were prepped and draped in usual sterile fashion. SonoSite ultrasound revealed widely patent right internal jugular vein. Local anesthesia an 18-gauge needle the right internal jugular vein was easily accessed and a guidewire was passed down the level the right atrium. This was confirmed with fluoroscopy. A dilator and peel-away sheath was passed over the guidewire. The dilator and guidewire removed. A 24 cm catheter was passed through the peel-away sheath. The sheath was  removed. The tip replacement level the distal right atrium. The catheter was brought through subcutaneous tunnel through a separate stab incision. 2 lm ports were attached in both lumens flushed and aspirated easily and were locked with 1000 unit per cc heparin. Catheter was secured to the skin with 3-0 nylon stitch and the entry site was closed with a 4-0 subcuticular Vicryl stitch. Sterile dressing was applied the patient was taken to the recovery room in stable condition where chest x-ray is pending   Curt Jews, M.D. 01/08/2016 1:33 PM

## 2016-01-08 NOTE — Progress Notes (Signed)
Subjective:  UOP better (2.4L off/ 24 hours for a net of -1.7L). Was being wheeled off to ultrasound.    Objective Vital signs in last 24 hours: Filed Vitals:   01/07/16 2147 01/08/16 0230 01/08/16 0538 01/08/16 0834  BP: 157/110 142/76 179/93 165/100  Pulse: 88 86 98 93  Temp:  98.4 F (36.9 C) 98.6 F (37 C) 98.6 F (37 C)  TempSrc:  Oral Oral Oral  Resp:  18 19 18   Height:      Weight:      SpO2:  98% 93% 98%   Weight change:   Intake/Output Summary (Last 24 hours) at 01/08/16 1006 Last data filed at 01/08/16 0835  Gross per 24 hour  Intake    558 ml  Output   2300 ml  Net  -1742 ml   Physical Exam: General: alert, NAD, being wheeled off to ultrasound Dialysis Access: AVF in left upper arm- no thrill or bruit   Labs: Basic Metabolic Panel:  Recent Labs Lab 01/06/16 0352 01/07/16 0431 01/08/16 0517  NA 143 142 141  K 4.2 4.3 4.1  CL 117* 115* 111  CO2 17* 16* 17*  GLUCOSE 84 85 84  BUN 61* 61* 61*  CREATININE 3.26* 3.38* 3.63*  CALCIUM 7.8* 8.2* 8.5*  PHOS  --  7.0* 7.3*   Liver Function Tests:  Recent Labs Lab 01/05/16 1702 01/07/16 0431 01/08/16 0517  AST 15  --   --   ALT 9*  --   --   ALKPHOS 57  --   --   BILITOT 0.4  --   --   PROT 5.2*  --   --   ALBUMIN 1.5* 1.3* 1.4*   No results for input(s): LIPASE, AMYLASE in the last 168 hours. No results for input(s): AMMONIA in the last 168 hours. CBC:  Recent Labs Lab 01/05/16 1702 01/06/16 0352 01/08/16 0518  WBC 11.1* 10.8* 10.0  HGB 9.1* 8.3* 8.7*  HCT 31.3* 27.8* 28.6*  MCV 94.6 94.2 93.8  PLT 333 274 308   Cardiac Enzymes:  Recent Labs Lab 01/06/16 0351 01/06/16 0749 01/06/16 1335  TROPONINI <0.03 <0.03 <0.03   CBG:  Recent Labs Lab 01/06/16 2308  GLUCAP 115*    Iron Studies: No results for input(s): IRON, TIBC, TRANSFERRIN, FERRITIN in the last 72 hours. Studies/Results: No results found. Medications: Infusions:    Scheduled Medications: . cefUROXime  (ZINACEF)  IV  1.5 g Intravenous To SSTC  . Chlorhexidine Gluconate Cloth  6 each Topical Daily  . colchicine  0.6 mg Oral Daily  . darbepoetin (ARANESP) injection - NON-DIALYSIS  100 mcg Subcutaneous Q Sat-1800  . febuxostat  40 mg Oral Daily  . ferric gluconate (FERRLECIT/NULECIT) IV  125 mg Intravenous Daily  . furosemide  160 mg Intravenous Q4H  . heparin  5,000 Units Subcutaneous 3 times per day  . hydrALAZINE  50 mg Oral 3 times per day  . labetalol  200 mg Oral BID  . metolazone  10 mg Oral Daily  . mupirocin ointment  1 application Nasal BID  . predniSONE  5 mg Oral Q breakfast  . sodium bicarbonate  650 mg Oral TID  . sodium chloride flush  3 mL Intravenous Q12H   I have reviewed scheduled and prn medications.   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 Plans were being made to initiate dialysis in the outpatient setting. Plan to initiate dialysis while she is here. Plan for possible basilic vein transposition on the left or a Gore-Tex graft or right brachiocephalic fistula followed by initiation of dialysis- appreciate VVS assist.  Patient off for duplex so that a decision can be made.  She is scheduled to go to OR after ultrasound. 2. Hypertension/volume - massive volume overload.  Plan for first dialysis treatment today. BP elevated 160-190-70-100s.  She is on labetalol for now is okay to continue. 3. Pains in her legs- Uric acid 8.3. On colchicine and uloric 4. Anemia - Hgb 8.7. Plan to give her another dose of iron and probably an ESA as well 5. Bones- last PTH 132 as an OP- no meds needed   Iann Rodier M. Lajuana Ripple, DO PGY-2, [facility] 01/08/2016,10:06  AM  LOS: 3 days

## 2016-01-08 NOTE — Anesthesia Preprocedure Evaluation (Addendum)
Anesthesia Evaluation  Patient identified by MRN, date of birth, ID band Patient awake    Reviewed: Allergy & Precautions, H&P , NPO status , Patient's Chart, lab work & pertinent test results  History of Anesthesia Complications Negative for: history of anesthetic complications  Airway Mallampati: III  TM Distance: >3 FB Neck ROM: full    Dental no notable dental hx. (+) Teeth Intact   Pulmonary Current Smoker, former smoker,   Does have a cough, afebrile, feels like herself   + decreased breath sounds      Cardiovascular hypertension, Pt. on medications Normal cardiovascular exam Rhythm:regular Rate:Normal     Neuro/Psych negative neurological ROS     GI/Hepatic negative GI ROS, Neg liver ROS,   Endo/Other  negative endocrine ROS  Renal/GU ESRFRenal diseaseFSGS     Musculoskeletal   Abdominal   Peds  Hematology  (+) anemia ,   Anesthesia Other Findings   Reproductive/Obstetrics negative OB ROS                            Anesthesia Physical  Anesthesia Plan  ASA: IV  Anesthesia Plan: MAC   Post-op Pain Management:    Induction: Intravenous  Airway Management Planned:   Additional Equipment:   Intra-op Plan:   Post-operative Plan:   Informed Consent: I have reviewed the patients History and Physical, chart, labs and discussed the procedure including the risks, benefits and alternatives for the proposed anesthesia with the patient or authorized representative who has indicated his/her understanding and acceptance.   Dental Advisory Given  Plan Discussed with: CRNA  Anesthesia Plan Comments:       Anesthesia Quick Evaluation

## 2016-01-08 NOTE — H&P (View-Only) (Signed)
Consult Note  Patient name: Sandy [name] MRN: [id number] DOB: 1976/03/26 Sex: female  Consulting Physician:  Dr. Moshe Cipro  Reason for Consult:  Chief Complaint  Patient presents with  . 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  She has previously undergone a left brachiocephalic fistula by Dr. early on 11/24/2015.  She was seen in the office on 11/29/2015 with pain and swelling at the site.  This appears to have resolved.  At the office visit, the fistula was not palpated but had a Doppler signal.  I have been asked to place a dialysis access and to reevaluate her fistula.  She is right-handed  Past Medical History  Diagnosis Date  . Hypertension   . Anemia   . 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 Anasarca associated with disorder of kidney     Past Surgical History  Procedure Laterality Date  . Cesarean section    . Renal biopsy    . Av fistula placement Left 11/24/2015    Procedure: ARTERIOVENOUS (AV) FISTULA CREATION-LEFT UPPER ARM;  Surgeon: Rosetta Posner, MD;  Location: [facility] OR;  Service: Vascular;  Laterality: Left;    Social History   Social History  . Marital Status: Single    Spouse Name: N/A  . Number of Children: N/A  . Years of Education: N/A   Occupational History  . Not on file.   Social History Main Topics  . Smoking status: Former Smoker -- 0.25 packs/day    Types: Cigarettes    Quit date: 12/23/2015  . Smokeless tobacco: Never Used  . Alcohol Use: No  . Drug Use: No  . Sexual Activity: Not on file   Other Topics Concern  . Not on file   Social History Narrative    Family History  Problem Relation Age of Onset  . Diabetes Father     Allergies as of 01/05/2016 - Review Complete 01/05/2016  Allergen Reaction Noted  . Lisinopril Cough 10/14/2015    No current facility-administered medications on file prior to  encounter.   Current Outpatient Prescriptions on File Prior to Encounter  Medication Sig Dispense Refill  . acetaminophen (TYLENOL) 500 MG tablet Take 500 mg by mouth every 6 (six) hours as needed for moderate pain.    . hydrALAZINE (APRESOLINE) 50 MG tablet Take 50 mg by mouth 3 (three) times daily.    Marland Kitchen labetalol (NORMODYNE) 200 MG tablet Take 1 tablet (200 mg total) by mouth 2 (two) times daily. 60 tablet 1  . metolazone (ZAROXOLYN) 10 MG tablet Take 1 tablet (10 mg total) by mouth 2 (two) times daily. 60 tablet 1  . sodium bicarbonate 650 MG tablet Take 1 tablet (650 mg total) by mouth 2 (two) times daily. 60 tablet 1  . spironolactone (ALDACTONE) 50 MG tablet Take 1 tablet (50 mg total) by mouth daily. 30 tablet 1  . torsemide (DEMADEX) 100 MG tablet Take 1 tablet (100 mg total) by mouth 2 (two) times daily. 60 tablet 0     REVIEW OF SYSTEMS: Cardiovascular: Positive for chest pain and lower extremity swelling,  No history of DVT or phlebitis. Pulmonary: No productive cough, asthma or wheezing. Neurologic: No weakness, paresthesias, aphasia, or amaurosis. No dizziness. Hematologic: No bleeding problems or clotting disorders. Musculoskeletal: Positive for arthralgia Gastrointestinal: No blood in stool or hematemesis  Genitourinary: No dysuria or hematuria. Psychiatric:: No history of major depression. Integumentary: No rashes or ulcers. Constitutional: Positive for fatigue  PHYSICAL EXAMINATION: General: The patient appears their stated age.  Vital signs are BP 151/95 mmHg  Pulse 97  Temp(Src) 98.2 F (36.8 C) (Oral)  Resp 19  Ht 5\' 6"  (1.676 m)  Wt 227 lb 8.2 oz (103.2 kg)  BMI 36.74 kg/m2  SpO2 97% Pulmonary: Respirations are non-labored HEENT:  No gross abnormalities Musculoskeletal: There are no major deformities.   Neurologic: No focal weakness or paresthesias are detected, Skin: There are no ulcer or rashes noted. Psychiatric: The patient has normal  affect. Cardiovascular: There is a regular rate and rhythm.  I cannot palpate a thrill within her left brachiocephalic fistula.  No significant pain or swelling in this area  Diagnostic Studies: I have reviewed her vein mapping from her prior hospitalization: +--------+-----------+--------+------+---------------+------------+ Vein Location DiameterDepth Observations Comments  +--------+-----------+--------+------+---------------+------------+ CephalicProximal 3.1 mm [id number] ---------------Patent   right upper mm  Compressible  arm      +--------+-----------+--------+------+---------------+------------+ CephalicMid right [id number] mm 6.16 ---------------Patent   upper arm  mm  Compressible +--------+-----------+--------+------+---------------+------------+ CephalicAbove [id number] mm 4.15 Partial Partially   AC  mm thrombosis compressible +--------+-----------+--------+------+---------------+------------+ CephalicRight AC Q000111Q mm [id number] Partial Partially     mm thrombosis, compressible     branch   +--------+-----------+--------+------+---------------+------------+ CephalicBelow right--------------IV ------------  AC      +--------+-----------+--------+------+---------------+------------+ CephalicMid right --------------Total Non   forearm   thrombosis compressible +--------+-----------+--------+------+---------------+------------+ CephalicRight  wrist----------------------------------------- +--------+-----------+--------+------+---------------+------------+ Basilic Proximal Q000111Q mm 10.3 ---------------Patent   right upper mm  Compressible  arm      +--------+-----------+--------+------+---------------+------------+ Basilic Mid right XX123456 mm 17.9 ---------------Patent   upper arm  mm  Compressible +--------+-----------+--------+------+---------------+------------+ Basilic Above [id number] mm 12.6 ---------------Patent   AC  mm  Compressible +--------+-----------+--------+------+---------------+------------+ Basilic Right AC Q000111Q mm 10.5 ---------------Patent     mm  Compressible +--------+-----------+--------+------+---------------+------------+ Basilic Below right-----------------------------------------  AC      +--------+-----------+--------+------+---------------+------------+ Basilic Mid right -----------------------------------------  forearm      +--------+-----------+--------+------+---------------+------------+ Basilic Right wrist----------------------------------------- +--------+-----------+--------+------+---------------+------------+ CephalicProximal Q000111Q mm 123XX123 ---------------Patent   left upper  mm  Compressible  arm      +--------+-----------+--------+------+---------------+------------+ CephalicMid left A999333 mm A999333 ---------------Patent   upper arm   mm  Compressible +--------+-----------+--------+------+---------------+------------+ CephalicAbove left 123XX123 mm 5.02 ---------------Patent   AC  mm  Compressible +--------+-----------+--------+------+---------------+------------+ CephalicLeft AC Q000111Q mm XX123456 ---------------Patent     mm  Compressible +--------+-----------+--------+------+---------------+------------+ CephalicBelow left A999333 mm 6.12 ---------------Patent   AC  mm  Compressible +--------+-----------+--------+------+---------------+------------+ CephalicMid left XX123456 mm XX123456 ---------------Patent   forearm  mm  Compressible +--------+-----------+--------+------+---------------+------------+ CephalicLeft wrist [id number] mm 4.75 ---------------Patent     mm  Compressible +--------+-----------+--------+------+---------------+------------+ Basilic Proximal A999333 mm 13.1 ---------------Patent   left upper  mm  Compressible  arm      +--------+-----------+--------+------+---------------+------------+ Basilic Mid left 2.1 mm 15 mm ---------------Patent   upper arm    Compressible +--------+-----------+--------+------+---------------+------------+ Basilic Above left 2.1 mm 15 mm ---------------Patent   AC    Compressible +--------+-----------+--------+------+---------------+------------+ Basilic Left AC A999333 mm 123456 ---------------Patent     mm   Compressible +--------+-----------+--------+------+---------------+------------+ Basilic Below left 123XX123 mm 6.16 ---------------Patent   AC  mm  Compressible +--------+-----------+--------+------+---------------+------------+    Assessment:  Renal failure needing dialysis Plan: I am ordering a duplex of her fistula to see if it remains patent or whether or not it has occluded.  If it is occluded I discussed proceeding with a possible basilic vein transposition on the left or a Gore-Tex graft.  The other option  would be to place a right brachiocephalic fistula.  This decision will be made pending her fistula duplex.  I also discussed placing a catheter.  This will be scheduled for tomorrow     V. Leia Alf, M.D. [facility] Office: [phone] Pager:  [phone]

## 2016-01-08 NOTE — Anesthesia Postprocedure Evaluation (Signed)
Anesthesia Post Note  Patient: Sandy [name]  Procedure(s) Performed: Procedure(s) (LRB): INSERTION OF DIALYSIS CATHETER L (Right)  Patient location during evaluation: PACU Anesthesia Type: MAC Level of consciousness: awake and alert Pain management: pain level controlled Vital Signs Assessment: post-procedure vital signs reviewed and stable Respiratory status: spontaneous breathing Cardiovascular status: stable Anesthetic complications: no    Last Vitals:  Filed Vitals:   01/08/16 1601 01/08/16 1630  BP: 175/114 160/98  Pulse: 79 84  Temp:    Resp:      Last Pain:  Filed Vitals:   01/08/16 1634  PainSc: [place]

## 2016-01-08 NOTE — Interval H&P Note (Signed)
History and Physical Interval Note:  01/08/2016 11:28 AM  Sandy [name]  has presented today for surgery, with the diagnosis of CHRONIC KIDNEY DISEASE  The various methods of treatment have been discussed with the patient and family. After consideration of risks, benefits and other options for treatment, the patient has consented to  Procedure(s): INSERTION OF DIALYSIS CATHETER LEFT ARM POSSIBLE RIGHT ARM ACSESS (Left) as a surgical intervention .  The patient's history has been reviewed, patient examined, no change in status, stable for surgery.  I have reviewed the patient's chart and labs.  Questions were answered to the patient's satisfaction.     Curt Jews

## 2016-01-08 NOTE — Progress Notes (Signed)
[facility] PROGRESS NOTE  Sandy [name] [id number] DOB: 05/24/1976 DOA: 01/05/2016 PCP: No PCP Per Patient  Assessment/Plan: Sandy [name] is a 40 y.o. 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 She reports a 23lb weight gain over the past 7-10 days. She has had atypical chest pain and shortness of breath. She held her diuretic for two days because she developed bilateral foot pain that she thought was gout. The foot pain resolved without specific intervention. She tries to adhere to a 1.5L fluid restriction daily and a low sodium diet, but she admits that she traveled last week and had an increased amount of fast food. She had vascular access placed in her left arm recently, but she has not needed hemodialysis thus far.  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  --Plan to start HD [id number]  Metabolic acidosis:  sodium bicarb to TID.   Atypical chest pain --troponin, first negative --Echo EF 60 % --EKG; peak T wave.   Accelerated HTN --Continue home medications --IV hydralazine prn --Hydralazine, labetalol, lasix,   Code Status: Full code.  Family Communication: care discussed with patient.  Disposition Plan: remain inpatient for tx anasarca/    Consultants:  Nephrology   Procedures:  none  Antibiotics:  none  HPI/Subjective: Relates mild headaches.    Objective: Filed Vitals:   01/08/16 1318 01/08/16 1330  BP:  167/108  Pulse:  89  Temp: 98 F (36.7 C)   Resp:  23    Intake/Output Summary (Last 24 hours) at 01/08/16 1358 Last data filed at 01/08/16 1329  Gross per 24 hour  Intake    878 ml  Output   2505 ml  Net  -1627 ml   Filed Weights   01/05/16 1649  01/06/16 0144 01/06/16 2030  Weight: 104.237 kg (229 lb 12.8 oz) 103.647 kg (228 lb 8 oz) 103.2 kg (227 lb 8.2 oz)    Exam:   General:  Alert in no distress  Cardiovascular: S 1, S 2 RRR  Respiratory: CTA  Abdomen: BS present, soft, nt  Musculoskeletal: plus 2 edema.   Data Reviewed: Basic Metabolic Panel:  Recent Labs Lab 01/05/16 1702 01/06/16 0352 01/07/16 0431 01/08/16 0517  NA 143 143 142 141  K 4.7 4.2 4.3 4.1  CL 115* 117* 115* 111  CO2 17* 17* 16* 17*  GLUCOSE 114* 84 85 84  BUN 61* 61* 61* 61*  CREATININE 3.13* 3.26* 3.38* 3.63*  CALCIUM 8.1* 7.8* 8.2* 8.5*  PHOS  --   --  7.0* 7.3*   Liver Function Tests:  Recent Labs Lab 01/05/16 1702 01/07/16 0431 01/08/16 0517  AST 15  --   --   ALT 9*  --   --   ALKPHOS 57  --   --   BILITOT 0.4  --   --   PROT 5.2*  --   --   ALBUMIN 1.5* 1.3* 1.4*   No results for input(s): LIPASE, AMYLASE in the last 168 hours. No results for input(s): AMMONIA in the last 168 hours. CBC:  Recent Labs Lab 01/05/16 1702 01/06/16 0352 01/08/16 0518  WBC 11.1* 10.8* 10.0  HGB 9.1* 8.3* 8.7*  HCT 31.3* 27.8* 28.6*  MCV 94.6 94.2 93.8  PLT 333 274 308  Cardiac Enzymes:  Recent Labs Lab 01/06/16 0351 01/06/16 0749 01/06/16 1335  TROPONINI <0.03 <0.03 <0.03   BNP (last 3 results)  Recent Labs  10/14/15 1614 01/05/16 1838  BNP 201.7* 317.3*    ProBNP (last 3 results) No results for input(s): PROBNP in the last 8760 hours.  CBG:  Recent Labs Lab 01/06/16 2308  GLUCAP 115*    Recent Results (from the past 240 hour(s))  Surgical pcr screen     Status: Abnormal   Collection Time: 01/07/16 10:08 PM  Result Value Ref Range Status   MRSA, PCR NEGATIVE NEGATIVE Final   Staphylococcus aureus POSITIVE (A) NEGATIVE Final    Comment:        The Xpert SA Assay (FDA approved for NASAL specimens in patients over 57 years of age), is one component of a comprehensive surveillance program.  Test  performance has been validated by [facility] for patients greater than or equal to 32 year old. It is not intended to diagnose infection nor to guide or monitor treatment.      Studies: No results found.  Scheduled Meds: . [MAR Hold] Chlorhexidine Gluconate Cloth  6 each Topical Daily  . [MAR Hold] colchicine  0.6 mg Oral Daily  . [MAR Hold] darbepoetin (ARANESP) injection - NON-DIALYSIS  100 mcg Subcutaneous Q Sat-1800  . [MAR Hold] febuxostat  40 mg Oral Daily  . [MAR Hold] ferric gluconate (FERRLECIT/NULECIT) IV  125 mg Intravenous Daily  . [MAR Hold] furosemide  160 mg Intravenous Q4H  . [MAR Hold] heparin  5,000 Units Subcutaneous 3 times per day  . [MAR Hold] hydrALAZINE  50 mg Oral 3 times per day  . [MAR Hold] labetalol  200 mg Oral BID  . [MAR Hold] metolazone  10 mg Oral Daily  . [MAR Hold] mupirocin ointment  1 application Nasal BID  . [MAR Hold] predniSONE  5 mg Oral Q breakfast  . [MAR Hold] sodium bicarbonate  650 mg Oral TID  . [MAR Hold] sodium chloride flush  3 mL Intravenous Q12H   Continuous Infusions: . sodium chloride 10 mL/hr at 01/08/16 1147    Principal Problem:   Hypervolemia Active Problems:   Anasarca associated with disorder of kidney   CKD (chronic kidney disease), stage IV (HCC)   Accelerated hypertension   Atypical chest pain   Volume overload    Time spent: 35 minutes.     Niel Hummer A  [facility] Pager [phone]. If 7PM-7AM, please contact night-coverage at [email or web address], password [facility] 01/08/2016, 1:58 PM  LOS: 3 days

## 2016-01-08 NOTE — Progress Notes (Signed)
Pt returned from HD with headache and elevated BP. PRN hydralazine and pain medications given. Will continue to assess and monitor pt.   Gavin Potters, WTA

## 2016-01-09 ENCOUNTER — Encounter (HOSPITAL_COMMUNITY): Payer: Self-pay | Admitting: Vascular Surgery

## 2016-01-09 LAB — CBC
HCT: 27 % — ABNORMAL LOW (ref 36.0–46.0)
HEMOGLOBIN: 8.2 g/dL — AB (ref 12.0–15.0)
MCH: 28.4 pg (ref 26.0–34.0)
MCHC: 30.4 g/dL (ref 30.0–36.0)
MCV: 93.4 fL (ref 78.0–100.0)
PLATELETS: 258 10*3/uL (ref 150–400)
RBC: 2.89 MIL/uL — ABNORMAL LOW (ref 3.87–5.11)
RDW: 16.2 % — ABNORMAL HIGH (ref 11.5–15.5)
WBC: 9.3 10*3/uL (ref 4.0–10.5)

## 2016-01-09 LAB — RENAL FUNCTION PANEL
ANION GAP: 10 (ref 5–15)
Albumin: 1.4 g/dL — ABNORMAL LOW (ref 3.5–5.0)
BUN: 49 mg/dL — ABNORMAL HIGH (ref 6–20)
CALCIUM: 8 mg/dL — AB (ref 8.9–10.3)
CO2: 21 mmol/L — AB (ref 22–32)
Chloride: 107 mmol/L (ref 101–111)
Creatinine, Ser: 3.75 mg/dL — ABNORMAL HIGH (ref 0.44–1.00)
GFR calc Af Amer: 17 mL/min — ABNORMAL LOW (ref >60)
GFR calc non Af Amer: 14 mL/min — ABNORMAL LOW (ref >60)
GLUCOSE: 92 mg/dL (ref 65–99)
Phosphorus: 7.2 mg/dL — ABNORMAL HIGH (ref 2.5–4.6)
Potassium: 3.6 mmol/L (ref 3.5–5.1)
SODIUM: 138 mmol/L (ref 135–145)

## 2016-01-09 LAB — HEPATITIS B SURFACE ANTIBODY,QUALITATIVE: Hep B S Ab: NONREACTIVE

## 2016-01-09 LAB — HEPATITIS B CORE ANTIBODY, TOTAL: HEP B C TOTAL AB: NEGATIVE

## 2016-01-09 NOTE — Progress Notes (Signed)
Subjective:  UOP (934mL/24 hours; w/ 1L off during HD yesterday).  Patient reports not feeling well this morning.  She reports continued LE edema.  Objective Vital signs in last 24 hours: Filed Vitals:   01/08/16 1924 01/08/16 2043 01/09/16 0500 01/09/16 0829  BP: 153/87 173/98 135/99 160/99  Pulse: 91 96 92 89  Temp:  98.4 F (36.9 C) 98.8 F (37.1 C) 98.9 F (37.2 C)  TempSrc:  Oral Oral Oral  Resp:  20 20 18   Height:      Weight:      SpO2:  100% 96% 95%   Weight change:   Intake/Output Summary (Last 24 hours) at 01/09/16 1324 Last data filed at 01/09/16 1230  Gross per 24 hour  Intake    946 ml  Output   1650 ml  Net   -704 ml   Physical Exam: General: alert, awake, NAD Cardio: Rate regular Pulm: normal WOB on room air Ext: LE edema appreciated Dialysis Access: AVF in left upper arm- no thrill or bruit   Labs: Basic Metabolic Panel:  Recent Labs Lab 01/07/16 0431 01/08/16 0517 01/09/16 1022  NA 142 141 138  K 4.3 4.1 3.6  CL 115* 111 107  CO2 16* 17* 21*  GLUCOSE 85 84 92  BUN 61* 61* 49*  CREATININE 3.38* 3.63* 3.75*  CALCIUM 8.2* 8.5* 8.0*  PHOS 7.0* 7.3* 7.2*   Liver Function Tests:  Recent Labs Lab 01/05/16 1702 01/07/16 0431 01/08/16 0517 01/09/16 1022  AST 15  --   --   --   ALT 9*  --   --   --   ALKPHOS 57  --   --   --   BILITOT 0.4  --   --   --   PROT 5.2*  --   --   --   ALBUMIN 1.5* 1.3* 1.4* 1.4*   No results for input(s): LIPASE, AMYLASE in the last 168 hours. No results for input(s): AMMONIA in the last 168 hours. CBC:  Recent Labs Lab 01/05/16 1702 01/06/16 0352 01/08/16 0518 01/09/16 1022  WBC 11.1* 10.8* 10.0 9.3  HGB 9.1* 8.3* 8.7* 8.2*  HCT 31.3* 27.8* 28.6* 27.0*  MCV 94.6 94.2 93.8 93.4  PLT 333 274 308 258   Cardiac Enzymes:  Recent Labs Lab 01/06/16 0351 01/06/16 0749 01/06/16 1335  TROPONINI <0.03 <0.03 <0.03   CBG:  Recent Labs Lab 01/06/16 2308  GLUCAP 115*    Iron Studies: No results  for input(s): IRON, TIBC, TRANSFERRIN, FERRITIN in the last 72 hours. Studies/Results: No results found. Medications: Infusions: . sodium chloride 10 mL/hr at 01/09/16 0537    Scheduled Medications: . Chlorhexidine Gluconate Cloth  6 each Topical Daily  . colchicine  0.6 mg Oral Daily  . darbepoetin (ARANESP) injection - NON-DIALYSIS  100 mcg Subcutaneous Q Sat-1800  . febuxostat  40 mg Oral Daily  . ferric gluconate (FERRLECIT/NULECIT) IV  125 mg Intravenous Daily  . heparin  5,000 Units Subcutaneous 3 times per day  . hydrALAZINE  50 mg Oral 3 times per day  . labetalol  200 mg Oral BID  . mupirocin ointment  1 application Nasal BID  . predniSONE  5 mg Oral Q breakfast  . sodium bicarbonate  650 mg Oral TID  . sodium chloride flush  3 mL Intravenous Q12H   I have reviewed scheduled and prn medications.  Assessment/Plan: 39 year old black female with a prolonged course with FSG that's been refractory to treatment now presenting  with worsening volume status in the setting of weaning steroids and also uremic symptoms that will necessitate initiation of dialysis  1.Renal- advanced CKD secondary to FSG and also volume overload that's refractory to treatment. Metolazone and Lasix to be dc'd. s/p R IJ HD catheter placement 4/17.  Had HD yesterday. 1L taken off.  HD planned for tomorrow. 2. Hypertension/volume - continues to be volume overloaded.  BP elevated 130-180/90-100s.  She is on labetalol for now.  Could consider increasing the dose for better BP control. 3. Pains in her legs-  On colchicine and uloric 4. Anemia - Hgb 8.2. Plan to give her another dose of iron and probably an ESA as well 5. Bones- last PTH 132 as an OP- no meds needed   Sandy M. Lajuana Ripple, DO PGY-2, [facility] 01/09/2016,1:24 PM  LOS: 4 days

## 2016-01-09 NOTE — Progress Notes (Signed)
[facility] PROGRESS NOTE  Sandy Sandy [name] [id number] DOB: [birth year] DOA: 01/05/2016 PCP: No PCP Per Patient  Assessment/Plan: Sandy Sandy [name] is Sandy [name] 40 y.o. 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 She reports Sandy [name] 23lb weight gain over the past 7-10 days. She has had atypical chest pain and shortness of breath. She held her diuretic for two days because she developed bilateral foot pain that she thought was gout. The foot pain resolved without specific intervention. She tries to adhere to Sandy [name] 1.5L fluid restriction daily and Sandy [name] low sodium diet, but she admits that she traveled last week and had an increased amount of fast food. She had vascular access placed in her left arm recently, but she has not needed hemodialysis thus far.  Patient admitted with anasarca, progression of CKD. She was started on HD. Her first HD was 4-17. Plan per nephrology. She will need clip.   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  --started  HD 4-17.  --discussed with Dr [employer], will discontinue lasix and metolazone.  --continue with dialysis.  --nausea should improved with subsequently HD treatments.   Metabolic acidosis:  sodium bicarb to TID.  Suspect will improved with HD.   Atypical chest pain --troponin, first negative --Echo EF 60 % --EKG; peak T wave.   Accelerated HTN --Continue home medications --IV hydralazine prn --Hydralazine, labetalol.   Code Status: Full code.  Family Communication: care discussed with patient.  Disposition Plan: remain inpatient for tx anasarca/    Consultants:  Nephrology   Procedures:  none  Antibiotics:  none  HPI/Subjective: Report headache has improved.  Still with nausea.     Objective: Filed Vitals:   01/09/16 0500 01/09/16 0829  BP: 135/99 160/99  Pulse: 92 89  Temp: 98.8 F (37.1 C) 98.9 F (37.2 C)  Resp: 20 18    Intake/Output Summary (Last 24 hours) at 01/09/16 1000 Last data filed at 01/09/16 0830  Gross per 24 hour  Intake    886 ml  Output   1355 ml  Net   -469 ml   Filed Weights   01/06/16 2030 01/08/16 1510 01/08/16 1724  Weight: 103.2 kg (227 lb 8.2 oz) 104 kg (229 lb 4.5 oz) 102.6 kg (226 lb 3.1 oz)    Exam:   General:  Alert in no distress  Cardiovascular: S 1, S 2 RRR  Respiratory: CTA  Abdomen: BS present, soft, nt  Musculoskeletal: plus 2 edema.   Data Reviewed: Basic Metabolic Panel:  Recent Labs Lab 01/05/16 1702 01/06/16 0352 01/07/16 0431 01/08/16 0517  NA 143 143 142 141  K 4.7 4.2 4.3 4.1  CL 115* 117* 115* 111  CO2 17* 17* 16* 17*  GLUCOSE 114* 84 85 84  BUN 61* 61* 61* 61*  CREATININE 3.13* 3.26* 3.38* 3.63*  CALCIUM 8.1* 7.8* 8.2* 8.5*  PHOS  --   --  7.0* 7.3*   Liver Function Tests:  Recent Labs Lab 01/05/16 1702 01/07/16 0431 01/08/16 0517  AST 15  --   --   ALT 9*  --   --   ALKPHOS 57  --   --   BILITOT 0.4  --   --   PROT 5.2*  --   --   ALBUMIN 1.5* 1.3* 1.4*  No results for input(s): LIPASE, AMYLASE in the last 168 hours. No results for input(s): AMMONIA in the last 168 hours. CBC:  Recent Labs Lab 01/05/16 1702 01/06/16 0352 01/08/16 0518  WBC 11.1* 10.8* 10.0  HGB 9.1* 8.3* 8.7*  HCT 31.3* 27.8* 28.6*  MCV 94.6 94.2 93.8  PLT 333 274 308   Cardiac Enzymes:  Recent Labs Lab 01/06/16 0351 01/06/16 0749 01/06/16 1335  TROPONINI <0.03 <0.03 <0.03   BNP (last 3 results)  Recent Labs  10/14/15 1614 01/05/16 1838  BNP 201.7* 317.3*    ProBNP (last 3 results) No results for input(s): PROBNP in the last 8760 hours.  CBG:  Recent Labs Lab 01/06/16 2308  GLUCAP 115*    Recent Results (from the past 240 hour(s))  Surgical pcr screen     Status:  Abnormal   Collection Time: 01/07/16 10:08 PM  Result Value Ref Range Status   MRSA, PCR NEGATIVE NEGATIVE Final   Staphylococcus aureus POSITIVE (Sandy [name]) NEGATIVE Final    Comment:        The Xpert SA Assay (FDA approved for NASAL specimens in patients over 38 years of age), is one component of Sandy [name] comprehensive surveillance program.  Test performance has been validated by [facility] for patients greater than or equal to 17 year old. It is not intended to diagnose infection nor to guide or monitor treatment.      Studies: No results found.  Scheduled Meds: . Chlorhexidine Gluconate Cloth  6 each Topical Daily  . colchicine  0.6 mg Oral Daily  . darbepoetin (ARANESP) injection - NON-DIALYSIS  100 mcg Subcutaneous Q Sat-1800  . febuxostat  40 mg Oral Daily  . ferric gluconate (FERRLECIT/NULECIT) IV  125 mg Intravenous Daily  . furosemide  160 mg Intravenous Q4H  . heparin  5,000 Units Subcutaneous 3 times per day  . hydrALAZINE  50 mg Oral 3 times per day  . labetalol  200 mg Oral BID  . metolazone  10 mg Oral Daily  . mupirocin ointment  1 application Nasal BID  . predniSONE  5 mg Oral Q breakfast  . sodium bicarbonate  650 mg Oral TID  . sodium chloride flush  3 mL Intravenous Q12H   Continuous Infusions: . sodium chloride 10 mL/hr at 01/09/16 [id number]    Principal Problem:   Hypervolemia Active Problems:   Anasarca associated with disorder of kidney   CKD (chronic kidney disease), stage IV (HCC)   Accelerated hypertension   Atypical chest pain   Volume overload    Time spent: 35 minutes.     Sandy Sandy [name]  [facility] Pager [phone]. If 7PM-7AM, please contact night-coverage at [email or web address], password [facility] 01/09/2016, 10:00 AM  LOS: 4 days

## 2016-01-10 LAB — CBC
HCT: 26.5 % — ABNORMAL LOW (ref 36.0–46.0)
HEMOGLOBIN: 8.1 g/dL — AB (ref 12.0–15.0)
MCH: 28.4 pg (ref 26.0–34.0)
MCHC: 30.6 g/dL (ref 30.0–36.0)
MCV: 93 fL (ref 78.0–100.0)
PLATELETS: 255 10*3/uL (ref 150–400)
RBC: 2.85 MIL/uL — AB (ref 3.87–5.11)
RDW: 15.9 % — ABNORMAL HIGH (ref 11.5–15.5)
WBC: 8.8 10*3/uL (ref 4.0–10.5)

## 2016-01-10 LAB — RENAL FUNCTION PANEL
ANION GAP: 12 (ref 5–15)
Albumin: 1.4 g/dL — ABNORMAL LOW (ref 3.5–5.0)
BUN: 51 mg/dL — ABNORMAL HIGH (ref 6–20)
CALCIUM: 7.9 mg/dL — AB (ref 8.9–10.3)
CO2: 22 mmol/L (ref 22–32)
CREATININE: 4.14 mg/dL — AB (ref 0.44–1.00)
Chloride: 105 mmol/L (ref 101–111)
GFR, EST AFRICAN AMERICAN: 15 mL/min — AB (ref >60)
GFR, EST NON AFRICAN AMERICAN: 13 mL/min — AB (ref >60)
Glucose, Bld: 74 mg/dL (ref 65–99)
PHOSPHORUS: 7.4 mg/dL — AB (ref 2.5–4.6)
Potassium: 3.4 mmol/L — ABNORMAL LOW (ref 3.5–5.1)
SODIUM: 139 mmol/L (ref 135–145)

## 2016-01-10 MED ORDER — SODIUM CHLORIDE 0.9 % IV SOLN: 100.0000 mL | INTRAVENOUS | Status: DC | PRN

## 2016-01-10 MED ORDER — PENTAFLUOROPROP-TETRAFLUOROETH EX AERO: 1.0000 "application " | INHALATION_SPRAY | CUTANEOUS | Status: DC | PRN

## 2016-01-10 MED ORDER — LIDOCAINE-PRILOCAINE 2.5-2.5 % EX CREA: 1.0000 "application " | TOPICAL_CREAM | CUTANEOUS | Status: DC | PRN

## 2016-01-10 MED ORDER — PROMETHAZINE HCL 25 MG PO TABS
12.5000 mg | ORAL_TABLET | Freq: Four times a day (QID) | ORAL | Status: DC | PRN
  Administered 2016-01-17: 12.5 mg via ORAL
  Filled 2016-01-10 (×2): qty 1

## 2016-01-10 MED ORDER — ALTEPLASE 2 MG IJ SOLR: 2.0000 mg | Freq: Once | INTRAMUSCULAR | Status: DC | PRN

## 2016-01-10 MED ORDER — LIDOCAINE HCL (PF) 1 % IJ SOLN: 5.0000 mL | INTRAMUSCULAR | Status: DC | PRN

## 2016-01-10 MED ORDER — HEPARIN SODIUM (PORCINE) 1000 UNIT/ML DIALYSIS: 1000.0000 [IU] | INTRAMUSCULAR | Status: DC | PRN

## 2016-01-10 NOTE — Progress Notes (Signed)
[facility] ROUNDING NOTE   Subjective:   Interval History:  Still with edema  Dialysis dependent nephrotic syndrome  Objective:  Vital signs in last 24 hours:  Temp:  [98.6 F (37 C)-98.8 F (37.1 C)] 98.8 F (37.1 C) (04/19 0621) Pulse Rate:  [81-87] 83 (04/19 0621) Resp:  [18-20] 18 (04/19 0621) BP: (140-180)/(83-112) 140/83 mmHg (04/19 0621) SpO2:  [94 %-98 %] 94 % (04/19 0621)  Weight change:  Filed Weights   01/06/16 2030 01/08/16 1510 01/08/16 1724  Weight: 103.2 kg (227 lb 8.2 oz) 104 kg (229 lb 4.5 oz) 102.6 kg (226 lb 3.1 oz)    Intake/Output: I/O last 3 completed shifts: In: 1206 [P.O.:1140; IV Piggyback:66] Out: 1500 [Urine:1500]   Intake/Output this shift:     CVS- RRR RS- CTA ABD- BS present soft non-distended EXT- 3 +edema   Basic Metabolic Panel:  Recent Labs Lab 01/06/16 0352 01/07/16 0431 01/08/16 0517 01/09/16 1022 01/10/16 0603  NA 143 142 141 138 139  K 4.2 4.3 4.1 3.6 3.4*  CL 117* 115* 111 107 105  CO2 17* 16* 17* 21* 22  GLUCOSE 84 85 84 92 74  BUN 61* 61* 61* 49* 51*  CREATININE 3.26* 3.38* 3.63* 3.75* 4.14*  CALCIUM 7.8* 8.2* 8.5* 8.0* 7.9*  PHOS  --  7.0* 7.3* 7.2* 7.4*    Liver Function Tests:  Recent Labs Lab 01/05/16 1702 01/07/16 0431 01/08/16 0517 01/09/16 1022 01/10/16 0603  AST 15  --   --   --   --   ALT 9*  --   --   --   --   ALKPHOS 57  --   --   --   --   BILITOT 0.4  --   --   --   --   PROT 5.2*  --   --   --   --   ALBUMIN 1.5* 1.3* 1.4* 1.4* 1.4*   No results for input(s): LIPASE, AMYLASE in the last 168 hours. No results for input(s): AMMONIA in the last 168 hours.  CBC:  Recent Labs Lab 01/05/16 1702 01/06/16 0352 01/08/16 0518 01/09/16 1022 01/10/16 0603  WBC 11.1* 10.8* 10.0 9.3 8.8  HGB 9.1* 8.3* 8.7* 8.2* 8.1*  HCT 31.3* 27.8* 28.6* 27.0* 26.5*  MCV 94.6 94.2 93.8 93.4 93.0  PLT 333 274 308 258 255    Cardiac Enzymes:  Recent Labs Lab 01/06/16 0351  01/06/16 0749 01/06/16 1335  TROPONINI <0.03 <0.03 <0.03    BNP: Invalid input(s): POCBNP  CBG:  Recent Labs Lab 01/06/16 2308  GLUCAP 115*    Microbiology: Results for orders placed or performed during the hospital encounter of 01/05/16  Surgical pcr screen     Status: Abnormal   Collection Time: 01/07/16 10:08 PM  Result Value Ref Range Status   MRSA, PCR NEGATIVE NEGATIVE Final   Staphylococcus aureus POSITIVE (A) NEGATIVE Final    Comment:        The Xpert SA Assay (FDA approved for NASAL specimens in patients over 72 years of age), is one component of a comprehensive surveillance program.  Test performance has been validated by [facility] for patients greater than or equal to 35 year old. It is not intended to diagnose infection nor to guide or monitor treatment.     Coagulation Studies:  Recent Labs  01/08/16 0518  LABPROT 13.0  INR 0.96    Urinalysis: No results for input(s): COLORURINE, LABSPEC, [place], GLUCOSEU, [place], BILIRUBINUR, KETONESUR, PROTEINUR,  UROBILINOGEN, NITRITE, LEUKOCYTESUR in the last 72 hours.  Invalid input(s): APPERANCEUR    Imaging: No results found.   Medications:   . sodium chloride 10 mL/hr at 01/09/16 0537   . Chlorhexidine Gluconate Cloth  6 each Topical Daily  . darbepoetin (ARANESP) injection - NON-DIALYSIS  100 mcg Subcutaneous Q Sat-1800  . febuxostat  40 mg Oral Daily  . ferric gluconate (FERRLECIT/NULECIT) IV  125 mg Intravenous Daily  . heparin  5,000 Units Subcutaneous 3 times per day  . hydrALAZINE  50 mg Oral 3 times per day  . labetalol  200 mg Oral BID  . mupirocin ointment  1 application Nasal BID  . predniSONE  5 mg Oral Q breakfast  . sodium bicarbonate  650 mg Oral TID  . 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 Metolazone and Lasix to be dc'd. s/p R IJ HD catheter placement 4/17. Had HD yesterday. 1L taken off. HD planned for tomorrow. 2. Hypertension/volume - continues to be volume overloaded. BP elevated 130-180/90-100s. She is on labetalol for now. Continue fluid removal . 3. Pains in her legs- On  uloric 4. Anemia - Hgb  8.1 getting ferric gluconate daliy and next dose aranesp sat 5. Bones- last PTH 132 as an OP- no meds needed     LOS: 5 [place] W @TODAY @9 :16 AM

## 2016-01-10 NOTE — Progress Notes (Signed)
PROGRESS NOTE    Sandy [name]  [id number] DOB: Jan 21, 1976 DOA: 01/05/2016 PCP: No PCP Per Patient   Outpatient Specialists:  Dr. Donnetta Hutching   Brief Narrative:  Patient admitted with anasarca, progression of CKD. She was started on HD. Her first HD was 4-17. Plan per nephrology. She will need clipping.     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  --continue with dialysis.  --nausea should improved with subsequently HD treatments.   Metabolic acidosis:  sodium bicarb to TID.  Suspect will improved with HD.   Atypical chest pain --troponin, first negative --Echo EF 60 % --EKG; peak T wave.   Accelerated HTN --Continue home medications --IV hydralazine prn --Hydralazine, labetalol.     DVT prophylaxis:  SQ Heparin  Code Status: Full   Family Communication: No family at bedside  Disposition Plan:     Consultants:   Nephrology   Procedures:        Subjective: Still with nausea  Objective: Filed Vitals:   01/09/16 1716 01/09/16 2105 01/10/16 0621 01/10/16 0930  BP: 180/112 143/87 140/83 145/91  Pulse: 81 87 83 84  Temp: 98.6 F (37 C) 98.8 F (37.1 C) 98.8 F (37.1 C) 98.3 F (36.8 C)  TempSrc: Oral   Oral  Resp: 18 20 18 15   Height:      Weight:      SpO2: 98% 95% 94% 99%    Intake/Output Summary (Last 24 hours) at 01/10/16 1136 Last data filed at 01/10/16 0930  Gross per 24 hour  Intake    660 ml  Output    850 ml  Net   -190 ml   Filed Weights   01/06/16 2030 01/08/16 1510 01/08/16 1724  Weight: 103.2 kg (227 lb 8.2 oz) 104 kg (229 lb 4.5 oz) 102.6 kg (226 lb 3.1 oz)     Examination:  General exam: Appears calm and comfortable  Respiratory system: Clear to auscultation. Respiratory effort normal. Cardiovascular system: S1 & S2 heard, RRR. No JVD, murmurs, rubs, gallops or clicks. No pedal edema. Gastrointestinal system: Abdomen is nondistended, soft and nontender. No organomegaly or masses felt. Normal bowel sounds heard. Central nervous system: Alert and oriented. No focal neurological deficits. Psychiatry: Judgement and insight appear normal. Mood & affect appropriate.     Data Reviewed: I have personally reviewed following labs and imaging studies  CBC:  Recent Labs Lab 01/05/16 1702 01/06/16 0352 01/08/16 0518 01/09/16 1022 01/10/16 0603  WBC 11.1* 10.8* 10.0 9.3 8.8  HGB 9.1* 8.3* 8.7* 8.2* 8.1*  HCT 31.3* 27.8* 28.6* 27.0* 26.5*  MCV 94.6 94.2 93.8 93.4 93.0  PLT 333 274 308 258 [id number]   Basic Metabolic Panel:  Recent Labs Lab 01/06/16 0352 01/07/16 0431 01/08/16 0517 01/09/16 1022 01/10/16 0603  NA 143 142 141 138 139  K 4.2 4.3 4.1 3.6 3.4*  CL 117* 115* 111 107 105  CO2 17* 16* 17* 21* 22  GLUCOSE 84 85 84 92 74  BUN 61* 61* 61* 49* 51*  CREATININE 3.26* 3.38* 3.63* 3.75* 4.14*  CALCIUM 7.8* 8.2* 8.5* 8.0* 7.9*  PHOS  --  7.0* 7.3* 7.2* 7.4*   GFR:  Estimated Creatinine Clearance: 22.5 mL/min (by C-G formula based on Cr of 4.14). Liver Function Tests:  Recent Labs Lab 01/05/16 1702 01/07/16 0431 01/08/16 0517 01/09/16 1022 01/10/16 0603  AST 15  --   --   --   --   ALT 9*  --   --   --   --   ALKPHOS 57  --   --   --   --   BILITOT 0.4  --   --   --   --   PROT 5.2*  --   --   --   --   ALBUMIN 1.5* 1.3* 1.4* 1.4* 1.4*   No results for input(s): LIPASE, AMYLASE in the last 168 hours. No results for input(s): AMMONIA in the last 168 hours. Coagulation Profile:  Recent Labs Lab 01/08/16 0518  INR 0.96   Cardiac Enzymes:  Recent Labs Lab 01/06/16 0351 01/06/16 0749 01/06/16 1335  TROPONINI <0.03  <0.03 <0.03   BNP (last 3 results) No results for input(s): PROBNP in the last 8760 hours. HbA1C: No results for input(s): HGBA1C in the last 72 hours. CBG:  Recent Labs Lab 01/06/16 2308  GLUCAP 115*   Lipid Profile: No results for input(s): CHOL, HDL, LDLCALC, TRIG, CHOLHDL, LDLDIRECT in the last 72 hours. Thyroid Function Tests: No results for input(s): TSH, T4TOTAL, FREET4, T3FREE, THYROIDAB in the last 72 hours. Anemia Panel: No results for input(s): VITAMINB12, FOLATE, FERRITIN, TIBC, IRON, RETICCTPCT in the last 72 hours. Urine analysis:    Component Value Date/Time   COLORURINE YELLOW 10/14/2015 1604   APPEARANCEUR CLOUDY* 10/14/2015 1604   LABSPEC 1.027 10/14/2015 1604   PHURINE 5.5 10/14/2015 [place] 10/14/2015 1604   HGBUR NEGATIVE 10/14/2015 [place] 10/14/2015 [place] 10/14/2015 1604   PROTEINUR >300* 10/14/2015 1604   NITRITE NEGATIVE 10/14/2015 1604   LEUKOCYTESUR NEGATIVE 10/14/2015 1604     Recent Results (from the past 240 hour(s))  Surgical pcr screen     Status: Abnormal   Collection Time: 01/07/16 10:08 PM  Result Value Ref Range Status   MRSA, PCR NEGATIVE NEGATIVE Final   Staphylococcus aureus POSITIVE (A) NEGATIVE Final    Comment:        The Xpert SA Assay (FDA approved for NASAL specimens in patients over 7 years of age), is one component of a comprehensive surveillance program.  Test performance has been validated by [facility] for patients greater than or equal to 32 year old. It is not intended to diagnose infection nor to guide or monitor treatment.       Anti-infectives    Start     Dose/Rate Route Frequency Ordered Stop   01/08/16 1200  cefUROXime (ZINACEF) 1.5 g in dextrose 5 % 50 mL IVPB     1.5 g 100 mL/hr over 30 Minutes Intravenous To Short Stay 01/07/16 1958 01/08/16 1245   01/08/16 0000  ceFAZolin (ANCEF) IVPB 1 g/50 mL premix  Status:  Discontinued     Comments:  Send with pt to OR   1 g 100 mL/hr over 30 Minutes Intravenous On call 01/07/16 1958 01/07/16 2010       Radiology Studies: No results found.      Scheduled Meds: . Chlorhexidine Gluconate Cloth  6 each Topical Daily  . darbepoetin (ARANESP) injection - NON-DIALYSIS  100 mcg Subcutaneous Q Sat-1800  . febuxostat  40 mg Oral Daily  . ferric gluconate (FERRLECIT/NULECIT) IV  125  mg Intravenous Daily  . heparin  5,000 Units Subcutaneous 3 times per day  . hydrALAZINE  50 mg Oral 3 times per day  . labetalol  200 mg Oral BID  . mupirocin ointment  1 application Nasal BID  . predniSONE  5 mg Oral Q breakfast  . sodium bicarbonate  650 mg Oral TID  . sodium chloride flush  3 mL Intravenous Q12H   Continuous Infusions: . sodium chloride 10 mL/hr at 01/09/16 0537     LOS: 5 days    Time spent: 25 min    [place], DO [facility] Pager [phone]  If 7PM-7AM, please contact night-coverage [email or web address] Password [facility] 01/10/2016, 11:36 AM

## 2016-01-10 NOTE — Clinical Documentation Improvement (Signed)
Hospitalist and/or Nephrology  Please document query responses in the progress notes and discharge summary, not on the CDI BPA form.  Thank you.  "Anemia" is documented in the current medical record.  Please document the TYPE of anemia, including any associated causes and/or conditions:  - Anemia of chronic kidney disease  - Other type of anemia  - Unable to clinically determine  Clinical information: "Anemia - while she is here we can give her another dose of iron and probably an ESA as well" is documented in the renal consult note 01/06/16.  CBC trend this admission: Component     Latest Ref Rng 01/05/2016 01/06/2016 01/08/2016 01/09/2016 01/10/2016  Hemoglobin     12.0 - 15.0 g/dL 9.1 (L) 8.3 (L) 8.7 (L) 8.2 (L) 8.1 (L)  HCT     36.0 - 46.0 % 31.3 (L) 27.8 (L) 28.6 (L) 27.0 (L) 26.5 (L)  MCV     78.0 - 100.0 fL 94.6 94.2 93.8 93.4 93.0  MCH     26.0 - 34.0 pg 27.5 28.1 28.5 28.4 28.4  MCHC     30.0 - 36.0 g/dL 29.1 (L) 29.9 (L) 30.4 30.4 30.6  RDW     11.5 - 15.5 % 16.1 (H) 16.2 (H) 16.1 (H) 16.2 (H) 15.9 (H)    Please exercise your independent, professional judgment when responding. A specific answer is not anticipated or expected.   Thank You, Erling Conte  RN BSN CCDS [phone] Health Information Management [place]

## 2016-01-10 NOTE — Progress Notes (Addendum)
  Progress Note    01/10/2016 8:40 AM 2 Days Post-Op  Subjective:  States she is having a little bit of pain with the catheter and itching around it.  afebrile  Filed Vitals:   01/09/16 2105 01/10/16 0621  BP: 143/87 140/83  Pulse: 87 83  Temp: 98.8 F (37.1 C) 98.8 F (37.1 C)  Resp: 20 18     CBC    Component Value Date/Time   WBC 8.8 01/10/2016 0603   RBC 2.85* 01/10/2016 0603   RBC 2.95* 10/19/2015 1200   HGB 8.1* 01/10/2016 0603   HCT 26.5* 01/10/2016 0603   PLT 255 01/10/2016 0603   MCV 93.0 01/10/2016 0603   MCH 28.4 01/10/2016 0603   MCHC 30.6 01/10/2016 0603   RDW 15.9* 01/10/2016 0603   LYMPHSABS 2.3 10/14/2015 1613   MONOABS 0.9 10/14/2015 1613   EOSABS 0.1 10/14/2015 1613   BASOSABS 0.0 10/14/2015 1613    BMET    Component Value Date/Time   NA 139 01/10/2016 0603   K 3.4* 01/10/2016 0603   CL 105 01/10/2016 0603   CO2 22 01/10/2016 0603   GLUCOSE 74 01/10/2016 0603   BUN 51* 01/10/2016 0603   CREATININE 4.14* 01/10/2016 0603   CALCIUM 7.9* 01/10/2016 0603   GFRNONAA 13* 01/10/2016 0603   GFRAA 15* 01/10/2016 0603    INR    Component Value Date/Time   INR 0.96 01/08/2016 0518     Intake/Output Summary (Last 24 hours) at 01/10/16 0840 Last data filed at 01/10/16 0650  Gross per 24 hour  Intake    780 ml  Output   1150 ml  Net   -370 ml     Assessment:  40 y.o. female is s/p:  Right IJ hemodialysis catheter placement SonoSite ultrasound visualization  2 Days Post-Op  Plan: -pt had TDC placed yesterday -may need fistulogram of AVF-will d/w Dr. Donnetta Hutching.     Leontine Locket, PA-C [facility] [phone] 01/10/2016 8:40 AM    I have examined the patient, reviewed and agree with above. Currently on hemodialysis. Was some nausea. Reports her breathing is easier since initiation of hemodialysis. She does not have a bruit or thrill in her left upper arm fistula. Discussed options with patient. Recommend  exploration of her brachial artery anastomosis. Would recommend left upper arm AV Gore-Tex graft unless she has operative findings that would potentially make fistula salvageable which is doubtful. Will follow and plan this when she is more stable medically.  Curt Jews, MD 01/10/2016 5:26 PM

## 2016-01-11 LAB — RENAL FUNCTION PANEL
ALBUMIN: 1.4 g/dL — AB (ref 3.5–5.0)
Anion gap: 12 (ref 5–15)
BUN: 31 mg/dL — AB (ref 6–20)
CHLORIDE: 103 mmol/L (ref 101–111)
CO2: 24 mmol/L (ref 22–32)
Calcium: 7.8 mg/dL — ABNORMAL LOW (ref 8.9–10.3)
Creatinine, Ser: 3.42 mg/dL — ABNORMAL HIGH (ref 0.44–1.00)
GFR calc Af Amer: 19 mL/min — ABNORMAL LOW (ref >60)
GFR, EST NON AFRICAN AMERICAN: 16 mL/min — AB (ref >60)
GLUCOSE: 98 mg/dL (ref 65–99)
POTASSIUM: 3.9 mmol/L (ref 3.5–5.1)
Phosphorus: 5.4 mg/dL — ABNORMAL HIGH (ref 2.5–4.6)
SODIUM: 139 mmol/L (ref 135–145)

## 2016-01-11 LAB — CBC
HEMATOCRIT: 29.3 % — AB (ref 36.0–46.0)
Hemoglobin: 8.8 g/dL — ABNORMAL LOW (ref 12.0–15.0)
MCH: 27.9 pg (ref 26.0–34.0)
MCHC: 30 g/dL (ref 30.0–36.0)
MCV: 93 fL (ref 78.0–100.0)
Platelets: 157 10*3/uL (ref 150–400)
RBC: 3.15 MIL/uL — ABNORMAL LOW (ref 3.87–5.11)
RDW: 16.3 % — AB (ref 11.5–15.5)
WBC: 10.4 10*3/uL (ref 4.0–10.5)

## 2016-01-11 LAB — GLUCOSE, CAPILLARY: Glucose-Capillary: 114 mg/dL — ABNORMAL HIGH (ref 65–99)

## 2016-01-11 NOTE — Progress Notes (Signed)
Patient ID: Sandy [name], female   DOB: March 21, 1976, 40 y.o.   MRN: [id number] Nausea improved. Reports this is worse with dialysis. No difficulty with hemodialysis catheter. Plan left AV fistula exploration 1 Monday and the OR. Doubt the fistula will be salvageable and will place a left upper arm AV graft if the fistula does not look like an option. Discussed again at length with the patient today understands. Will not see her over the weekend. We'll plan for surgery on Monday

## 2016-01-11 NOTE — Progress Notes (Signed)
PROGRESS NOTE    Sandy [name]  [id number] DOB: 07-07-1976 DOA: 01/05/2016 PCP: No PCP Per Patient   Outpatient Specialists:  Dr. Donnetta Hutching   Brief Narrative:  Patient admitted with anasarca, progression of CKD. She was started on HD. Her first HD was 4-17. Plan per nephrology. She will need clipping.     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  --continue with dialysis.  --nausea should improved with subsequently HD treatments.  -vascular plans Left upper arm AV graft if fistula not salvageable  Metabolic acidosis:  sodium bicarb to TID.  Suspect will improved with HD.   Atypical chest pain --troponin, first negative --Echo EF 60 % --EKG; peak T wave.   Anemia of chronic disease (renal failure) -transfuse for <7 -defer to renal  Accelerated HTN --Continue home medications --IV hydralazine prn --Hydralazine, labetalol.     DVT prophylaxis:  SQ Heparin  Code Status: Full   Family Communication: No family at bedside  Disposition Plan:     Consultants:   Nephrology   Procedures:        Subjective: Vomited x 1 yesterday  Objective: Filed Vitals:   01/10/16 1750 01/10/16 2234 01/11/16 0429 01/11/16 0900  BP: 162/92 123/80 142/87 139/90  Pulse: 87 85 88 88  Temp: 98.4 F (36.9 C) 98.7 F (37.1 C) 98.3 F (36.8 C) 98.9 F (37.2 C)  TempSrc: Oral   Oral  Resp: 18 20 21 18   Height:      Weight:      SpO2: 97% 96% 96% 96%    Intake/Output Summary (Last 24 hours) at 01/11/16 1124 Last data filed at 01/11/16 1029  Gross per 24 hour  Intake    600 ml  Output   3200 ml  Net  -2600 ml   Filed  Weights   01/08/16 1724 01/10/16 1331 01/10/16 1709  Weight: 102.6 kg (226 lb 3.1 oz) 102.9 kg (226 lb 13.7 oz) 99.4 kg (219 lb 2.2 oz)    Examination:  General exam: Appears calm and comfortable  Respiratory system: Clear to auscultation. Respiratory effort normal. Cardiovascular system: S1 & S2 heard, RRR. No JVD, murmurs, rubs, gallops or clicks. No pedal edema. Gastrointestinal system: Abdomen is nondistended, soft and nontender. No organomegaly or masses felt. Normal bowel sounds heard. Central nervous system: Alert and oriented. No focal neurological deficits. Psychiatry: Judgement and insight appear normal. Mood & affect appropriate.     Data Reviewed: I have personally reviewed following labs and imaging studies  CBC:  Recent Labs Lab 01/06/16 0352 01/08/16 0518 01/09/16 1022 01/10/16 0603 01/11/16 0306  WBC 10.8* 10.0 9.3 8.8 10.4  HGB 8.3* 8.7* 8.2* 8.1* 8.8*  HCT 27.8* 28.6* 27.0* 26.5* 29.3*  MCV 94.2 93.8 93.4 93.0 93.0  PLT 274 308 258 255 [id number]   Basic Metabolic Panel:  Recent Labs Lab 01/07/16 0431 01/08/16 0517 01/09/16 1022 01/10/16 0603 01/11/16 0306  NA 142 141 138 139 139  K 4.3 4.1 3.6 3.4* 3.9  CL 115* 111 107 105 103  CO2 16* 17* 21* 22 24  GLUCOSE 85 84 92 74 98  BUN 61* 61* 49* 51* 31*  CREATININE 3.38*  3.63* 3.75* 4.14* 3.42*  CALCIUM 8.2* 8.5* 8.0* 7.9* 7.8*  PHOS 7.0* 7.3* 7.2* 7.4* 5.4*   GFR: Estimated Creatinine Clearance: 26.8 mL/min (by C-G formula based on Cr of 3.42). Liver Function Tests:  Recent Labs Lab 01/05/16 1702 01/07/16 0431 01/08/16 0517 01/09/16 1022 01/10/16 0603 01/11/16 0306  AST 15  --   --   --   --   --   ALT 9*  --   --   --   --   --   ALKPHOS 57  --   --   --   --   --   BILITOT 0.4  --   --   --   --   --   PROT 5.2*  --   --   --   --   --   ALBUMIN 1.5* 1.3* 1.4* 1.4* 1.4* 1.4*   No results for input(s): LIPASE, AMYLASE in the last 168 hours. No results for input(s): AMMONIA in the last  168 hours. Coagulation Profile:  Recent Labs Lab 01/08/16 0518  INR 0.96   Cardiac Enzymes:  Recent Labs Lab 01/06/16 0351 01/06/16 0749 01/06/16 1335  TROPONINI <0.03 <0.03 <0.03   BNP (last 3 results) No results for input(s): PROBNP in the last 8760 hours. HbA1C: No results for input(s): HGBA1C in the last 72 hours. CBG:  Recent Labs Lab 01/06/16 2308  GLUCAP 115*   Lipid Profile: No results for input(s): CHOL, HDL, LDLCALC, TRIG, CHOLHDL, LDLDIRECT in the last 72 hours. Thyroid Function Tests: No results for input(s): TSH, T4TOTAL, FREET4, T3FREE, THYROIDAB in the last 72 hours. Anemia Panel: No results for input(s): VITAMINB12, FOLATE, FERRITIN, TIBC, IRON, RETICCTPCT in the last 72 hours. Urine analysis:    Component Value Date/Time   COLORURINE YELLOW 10/14/2015 1604   APPEARANCEUR CLOUDY* 10/14/2015 1604   LABSPEC 1.027 10/14/2015 1604   PHURINE 5.5 10/14/2015 [place] 10/14/2015 1604   HGBUR NEGATIVE 10/14/2015 [place] 10/14/2015 [place] 10/14/2015 1604   PROTEINUR >300* 10/14/2015 1604   NITRITE NEGATIVE 10/14/2015 1604   LEUKOCYTESUR NEGATIVE 10/14/2015 1604     Recent Results (from the past 240 hour(s))  Surgical pcr screen     Status: Abnormal   Collection Time: 01/07/16 10:08 PM  Result Value Ref Range Status   MRSA, PCR NEGATIVE NEGATIVE Final   Staphylococcus aureus POSITIVE (A) NEGATIVE Final    Comment:        The Xpert SA Assay (FDA approved for NASAL specimens in patients over 27 years of age), is one component of a comprehensive surveillance program.  Test performance has been validated by [facility] for patients greater than or equal to 21 year old. It is not intended to diagnose infection nor to guide or monitor treatment.       Anti-infectives    Start     Dose/Rate Route Frequency Ordered Stop   01/08/16 1200  cefUROXime (ZINACEF) 1.5 g in dextrose 5 % 50 mL IVPB      1.5 g 100 mL/hr over 30 Minutes Intravenous To Short Stay 01/07/16 1958 01/08/16 1245   01/08/16 0000  ceFAZolin (ANCEF) IVPB 1 g/50 mL premix  Status:  Discontinued    Comments:  Send with pt to OR   1 g 100 mL/hr over 30 Minutes Intravenous On call 01/07/16 1958 01/07/16 2010       Radiology Studies: No results found.      Scheduled  Meds: . Chlorhexidine Gluconate Cloth  6 each Topical Daily  . darbepoetin (ARANESP) injection - NON-DIALYSIS  100 mcg Subcutaneous Q Sat-1800  . febuxostat  40 mg Oral Daily  . ferric gluconate (FERRLECIT/NULECIT) IV  125 mg Intravenous Daily  . heparin  5,000 Units Subcutaneous 3 times per day  . hydrALAZINE  50 mg Oral 3 times per day  . labetalol  200 mg Oral BID  . mupirocin ointment  1 application Nasal BID  . predniSONE  5 mg Oral Q breakfast  . sodium bicarbonate  650 mg Oral TID  . sodium chloride flush  3 mL Intravenous Q12H   Continuous Infusions: . sodium chloride 10 mL/hr at 01/09/16 0537     LOS: 6 days    Time spent: 25 min    Rafael Hernandez, DO [facility] Pager [phone]  If 7PM-7AM, please contact night-coverage [email or web address] Password [facility] 01/11/2016, 11:24 AM

## 2016-01-11 NOTE — Progress Notes (Signed)
[facility] ROUNDING NOTE   Subjective:   Interval History:  Continues to have LE edema  Dialysis dependent nephrotic syndrome.  Reports that she doesn't feel well.   Objective:  Vital signs in last 24 hours:  Temp:  [98.2 F (36.8 C)-98.9 F (37.2 C)] 98.9 F (37.2 C) (04/20 0900) Pulse Rate:  [76-96] 88 (04/20 0900) Resp:  [12-21] 18 (04/20 0900) BP: (123-186)/(80-123) 139/90 mmHg (04/20 0900) SpO2:  [96 %-97 %] 96 % (04/20 0900) Weight:  [219 lb 2.2 oz (99.4 kg)-226 lb 13.7 oz (102.9 kg)] 219 lb 2.2 oz (99.4 kg) (04/19 1709)  Weight change:  Filed Weights   01/08/16 1724 01/10/16 1331 01/10/16 1709  Weight: 226 lb 3.1 oz (102.6 kg) 226 lb 13.7 oz (102.9 kg) 219 lb 2.2 oz (99.4 kg)    Intake/Output: I/O last 3 completed shifts: In: 480 [P.O.:480] Out: 3650 [Urine:650; Other:3000]   Intake/Output this shift:  Total I/O In: 420 [P.O.:420] Out: 0   Gen: awake, alert, sitting in bed CVS- RRR RS- breathing normally on room air ABD- BS+, soft,  non-distended EXT- 3 +edema  Basic Metabolic Panel:  Recent Labs Lab 01/07/16 0431 01/08/16 0517 01/09/16 1022 01/10/16 0603 01/11/16 0306  NA 142 141 138 139 139  K 4.3 4.1 3.6 3.4* 3.9  CL 115* 111 107 105 103  CO2 16* 17* 21* 22 24  GLUCOSE 85 84 92 74 98  BUN 61* 61* 49* 51* 31*  CREATININE 3.38* 3.63* 3.75* 4.14* 3.42*  CALCIUM 8.2* 8.5* 8.0* 7.9* 7.8*  PHOS 7.0* 7.3* 7.2* 7.4* 5.4*    Liver Function Tests:  Recent Labs Lab 01/05/16 1702 01/07/16 0431 01/08/16 0517 01/09/16 1022 01/10/16 0603 01/11/16 0306  AST 15  --   --   --   --   --   ALT 9*  --   --   --   --   --   ALKPHOS 57  --   --   --   --   --   BILITOT 0.4  --   --   --   --   --   PROT 5.2*  --   --   --   --   --   ALBUMIN 1.5* 1.3* 1.4* 1.4* 1.4* 1.4*   No results for input(s): LIPASE, AMYLASE in the last 168 hours. No results for input(s): AMMONIA in the last 168 hours.  CBC:  Recent Labs Lab 01/06/16 0352  01/08/16 0518 01/09/16 1022 01/10/16 0603 01/11/16 0306  WBC 10.8* 10.0 9.3 8.8 10.4  HGB 8.3* 8.7* 8.2* 8.1* 8.8*  HCT 27.8* 28.6* 27.0* 26.5* 29.3*  MCV 94.2 93.8 93.4 93.0 93.0  PLT 274 308 258 255 157    Cardiac Enzymes:  Recent Labs Lab 01/06/16 0351 01/06/16 0749 01/06/16 1335  TROPONINI <0.03 <0.03 <0.03    BNP: Invalid input(s): POCBNP  CBG:  Recent Labs Lab 01/06/16 2308  GLUCAP 115*    Microbiology: Results for orders placed or performed during the hospital encounter of 01/05/16  Surgical pcr screen     Status: Abnormal   Collection Time: 01/07/16 10:08 PM  Result Value Ref Range Status   MRSA, PCR NEGATIVE NEGATIVE Final   Staphylococcus aureus POSITIVE (A) NEGATIVE Final    Comment:        The Xpert SA Assay (FDA approved for NASAL specimens in patients over 56 years of age), is one component of a comprehensive surveillance program.  Test performance has been  validated by [facility] for patients greater than or equal to 7 year old. It is not intended to diagnose infection nor to guide or monitor treatment.     Coagulation Studies: No results for input(s): LABPROT, INR in the last 72 hours.  Urinalysis: No results for input(s): COLORURINE, LABSPEC, PHURINE, GLUCOSEU, HGBUR, BILIRUBINUR, KETONESUR, PROTEINUR, UROBILINOGEN, NITRITE, LEUKOCYTESUR in the last 72 hours.  Invalid input(s): APPERANCEUR    Imaging: No results found.   Medications:   . sodium chloride 10 mL/hr at 01/09/16 0537   . Chlorhexidine Gluconate Cloth  6 each Topical Daily  . darbepoetin (ARANESP) injection - NON-DIALYSIS  100 mcg Subcutaneous Q Sat-1800  . febuxostat  40 mg Oral Daily  . ferric gluconate (FERRLECIT/NULECIT) IV  125 mg Intravenous Daily  . heparin  5,000 Units Subcutaneous 3 times per day  . hydrALAZINE  50 mg Oral 3 times per day  . labetalol  200 mg Oral BID  . mupirocin ointment  1 application Nasal BID  . predniSONE  5 mg Oral Q  breakfast  . sodium bicarbonate  650 mg Oral TID  . 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 has R IJ HD catheter placement 4/17. Had HD yesterday. 3L taken off. K stable this am 3.9, HD planned for 4/21.  Scheduled for permanent access on Monday. 2. Hypertension/volume - volume overloaded. BP elevated 120-180/80-100s. She is on labetalol and scheduled hydralazine. Continue fluid removal. 3. Pains in her legs- On  uloric 4. Anemia - Hgb  8.8 getting ferric gluconate daliy and next dose aranesp sat 5. Bones- last PTH 132 as an OP- no meds needed     LOS: 6 Rukiya Hodgkins, DO 01/11/16 @12 :10 PM

## 2016-01-12 LAB — RENAL FUNCTION PANEL
ALBUMIN: 1.4 g/dL — AB (ref 3.5–5.0)
ANION GAP: 11 (ref 5–15)
BUN: 36 mg/dL — AB (ref 6–20)
CHLORIDE: 102 mmol/L (ref 101–111)
CO2: 25 mmol/L (ref 22–32)
Calcium: 7.9 mg/dL — ABNORMAL LOW (ref 8.9–10.3)
Creatinine, Ser: 4.21 mg/dL — ABNORMAL HIGH (ref 0.44–1.00)
GFR calc Af Amer: 14 mL/min — ABNORMAL LOW (ref >60)
GFR, EST NON AFRICAN AMERICAN: 12 mL/min — AB (ref >60)
Glucose, Bld: 82 mg/dL (ref 65–99)
PHOSPHORUS: 6.2 mg/dL — AB (ref 2.5–4.6)
POTASSIUM: 3.6 mmol/L (ref 3.5–5.1)
Sodium: 138 mmol/L (ref 135–145)

## 2016-01-12 LAB — CBC
HEMATOCRIT: 27.1 % — AB (ref 36.0–46.0)
HEMOGLOBIN: 8.2 g/dL — AB (ref 12.0–15.0)
MCH: 28.5 pg (ref 26.0–34.0)
MCHC: 30.3 g/dL (ref 30.0–36.0)
MCV: 94.1 fL (ref 78.0–100.0)
Platelets: 166 10*3/uL (ref 150–400)
RBC: 2.88 MIL/uL — ABNORMAL LOW (ref 3.87–5.11)
RDW: 16.7 % — AB (ref 11.5–15.5)
WBC: 9.9 10*3/uL (ref 4.0–10.5)

## 2016-01-12 MED ORDER — LIDOCAINE-PRILOCAINE 2.5-2.5 % EX CREA: 1.0000 "application " | TOPICAL_CREAM | CUTANEOUS | Status: DC | PRN

## 2016-01-12 MED ORDER — SODIUM CHLORIDE 0.9 % IV SOLN: 100.0000 mL | INTRAVENOUS | Status: DC | PRN

## 2016-01-12 MED ORDER — ONDANSETRON HCL 4 MG/2ML IJ SOLN
INTRAMUSCULAR | Status: AC
Start: 2016-01-12 — End: 2016-01-12
  Filled 2016-01-12: qty 2

## 2016-01-12 MED ORDER — ALTEPLASE 2 MG IJ SOLR: 2.0000 mg | Freq: Once | INTRAMUSCULAR | Status: DC | PRN

## 2016-01-12 MED ORDER — PENTAFLUOROPROP-TETRAFLUOROETH EX AERO: 1.0000 "application " | INHALATION_SPRAY | CUTANEOUS | Status: DC | PRN

## 2016-01-12 MED ORDER — HEPARIN SODIUM (PORCINE) 1000 UNIT/ML DIALYSIS: 1000.0000 [IU] | INTRAMUSCULAR | Status: DC | PRN

## 2016-01-12 MED ORDER — LIDOCAINE HCL (PF) 1 % IJ SOLN: 5.0000 mL | INTRAMUSCULAR | Status: DC | PRN

## 2016-01-12 MED ORDER — PROMETHAZINE HCL 25 MG PO TABS
ORAL_TABLET | ORAL | Status: AC
  Filled 2016-01-12: qty 1

## 2016-01-12 NOTE — Progress Notes (Signed)
PROGRESS NOTE    Sandy [name]  [id number] DOB: 07/03/1976 DOA: 01/05/2016 PCP: No PCP Per Patient   Outpatient Specialists:  Dr. Donnetta Hutching   Brief Narrative:  Patient admitted with anasarca, progression of CKD. She was started on HD. Her first HD was 4-17. Plan per nephrology. She will need clipping.     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  --continue with dialysis.  --nausea should improved with subsequently HD treatments.  -vascular plans Left upper arm AV graft if fistula not salvageable on Monday 0000000  Metabolic acidosis:  sodium bicarb to TID.    Atypical chest pain --troponin, first negative --Echo EF 60 % --EKG; peak T wave.   Anemia of chronic disease (renal failure) -transfuse for <7 -defer to renal  Accelerated HTN --Continue home medications --IV hydralazine prn --Hydralazine, labetalol.     DVT prophylaxis:  SQ Heparin  Code Status: Full   Family Communication: No family at bedside  Disposition Plan:     Consultants:   Nephrology   Procedures:        Subjective: No further vomiting Did not tolerate full dialysis per patient  Objective: Filed Vitals:   01/12/16 0930 01/12/16 1000 01/12/16 1014 01/12/16 1043  BP: 141/98 136/77 155/90 161/94  Pulse: 96 59 90 91  Temp:   98.4 F (36.9 C) 98.6 F (37 C)  TempSrc:   Oral Oral  Resp:   20 18  Height:      Weight:   97.5 kg (214 lb 15.2 oz)   SpO2:   95% 96%    Intake/Output Summary (Last 24 hours) at 01/12/16 1517 Last data filed at 01/12/16 1426  Gross per 24 hour  Intake    360 ml  Output   3000 ml  Net  -2640 ml    Filed Weights   01/10/16 1709 01/12/16 0640 01/12/16 1014  Weight: 99.4 kg (219 lb 2.2 oz) 100.4 kg (221 lb 5.5 oz) 97.5 kg (214 lb 15.2 oz)    Examination:  General exam: Appears calm and comfortable  Central nervous system: Alert and oriented. No focal neurological deficits. Psychiatry: Judgement and insight appear normal. Mood & affect appropriate.     Data Reviewed: I have personally reviewed following labs and imaging studies  CBC:  Recent Labs Lab 01/08/16 0518 01/09/16 1022 01/10/16 0603 01/11/16 0306 01/12/16 0515  WBC 10.0 9.3 8.8 10.4 9.9  HGB 8.7* 8.2* 8.1* 8.8* 8.2*  HCT 28.6* 27.0* 26.5* 29.3* 27.1*  MCV 93.8 93.4 93.0 93.0 94.1  PLT 308 258 255 157 [id number]   Basic Metabolic Panel:  Recent Labs Lab 01/08/16 0517 01/09/16 1022 01/10/16 0603 01/11/16 0306 01/12/16 0515  NA 141 138 139 139 138  K 4.1 3.6 3.4* 3.9 3.6  CL 111 107 105 103 102  CO2 17* 21* 22 24 25   GLUCOSE 84 92 74 98 82  BUN 61* 49* 51* 31* 36*  CREATININE 3.63* 3.75* 4.14* 3.42* 4.21*  CALCIUM 8.5* 8.0* 7.9* 7.8* 7.9*  PHOS 7.3* 7.2* 7.4* 5.4* 6.2*   GFR: Estimated Creatinine Clearance: 21.5 mL/min (by C-G formula based on Cr of 4.21). Liver Function Tests:  Recent  Labs Lab 01/05/16 1702  01/08/16 0517 01/09/16 1022 01/10/16 0603 01/11/16 0306 01/12/16 0515  AST 15  --   --   --   --   --   --   ALT 9*  --   --   --   --   --   --   ALKPHOS 57  --   --   --   --   --   --   BILITOT 0.4  --   --   --   --   --   --   PROT 5.2*  --   --   --   --   --   --   ALBUMIN 1.5*  < > 1.4* 1.4* 1.4* 1.4* 1.4*  < > = values in this interval not displayed. No results for input(s): LIPASE, AMYLASE in the last 168 hours. No results for input(s): AMMONIA in the last 168 hours. Coagulation Profile:  Recent Labs Lab 01/08/16 0518  INR 0.96   Cardiac Enzymes:  Recent Labs Lab 01/06/16 0351 01/06/16 0749 01/06/16 1335  TROPONINI <0.03 <0.03 <0.03   BNP (last 3 results) No  results for input(s): PROBNP in the last 8760 hours. HbA1C: No results for input(s): HGBA1C in the last 72 hours. CBG:  Recent Labs Lab 01/06/16 2308 01/11/16 2026  GLUCAP 115* 114*   Lipid Profile: No results for input(s): CHOL, HDL, LDLCALC, TRIG, CHOLHDL, LDLDIRECT in the last 72 hours. Thyroid Function Tests: No results for input(s): TSH, T4TOTAL, FREET4, T3FREE, THYROIDAB in the last 72 hours. Anemia Panel: No results for input(s): VITAMINB12, FOLATE, FERRITIN, TIBC, IRON, RETICCTPCT in the last 72 hours. Urine analysis:    Component Value Date/Time   COLORURINE YELLOW 10/14/2015 1604   APPEARANCEUR CLOUDY* 10/14/2015 1604   LABSPEC 1.027 10/14/2015 1604   PHURINE 5.5 10/14/2015 [place] 10/14/2015 1604   HGBUR NEGATIVE 10/14/2015 [place] 10/14/2015 [place] 10/14/2015 1604   PROTEINUR >300* 10/14/2015 1604   NITRITE NEGATIVE 10/14/2015 1604   LEUKOCYTESUR NEGATIVE 10/14/2015 1604     Recent Results (from the past 240 hour(s))  Surgical pcr screen     Status: Abnormal   Collection Time: 01/07/16 10:08 PM  Result Value Ref Range Status   MRSA, PCR NEGATIVE NEGATIVE Final   Staphylococcus aureus POSITIVE (A) NEGATIVE Final    Comment:        The Xpert SA Assay (FDA approved for NASAL specimens in patients over 49 years of age), is one component of a comprehensive surveillance program.  Test performance has been validated by [facility] for patients greater than or equal to 43 year old. It is not intended to diagnose infection nor to guide or monitor treatment.       Anti-infectives    Start     Dose/Rate Route Frequency Ordered Stop   01/08/16 1200  cefUROXime (ZINACEF) 1.5 g in dextrose 5 % 50 mL IVPB     1.5 g 100 mL/hr over 30 Minutes Intravenous To Short Stay 01/07/16 1958 01/08/16 1245   01/08/16 0000  ceFAZolin (ANCEF) IVPB 1 g/50 mL premix  Status:  Discontinued    Comments:  Send with pt to  OR   1 g 100 mL/hr over 30 Minutes Intravenous On call 01/07/16 1958 01/07/16 2010       Radiology Studies: No results found.      Scheduled Meds: . Chlorhexidine Gluconate Cloth  6 each Topical  Daily  . darbepoetin (ARANESP) injection - NON-DIALYSIS  100 mcg Subcutaneous Q Sat-1800  . febuxostat  40 mg Oral Daily  . ferric gluconate (FERRLECIT/NULECIT) IV  125 mg Intravenous Daily  . heparin  5,000 Units Subcutaneous 3 times per day  . hydrALAZINE  50 mg Oral 3 times per day  . labetalol  200 mg Oral BID  . mupirocin ointment  1 application Nasal BID  . predniSONE  5 mg Oral Q breakfast  . sodium bicarbonate  650 mg Oral TID  . sodium chloride flush  3 mL Intravenous Q12H   Continuous Infusions: . sodium chloride 10 mL/hr at 01/09/16 0537     LOS: 7 days    Time spent: 15 min    [place], DO [facility] Pager [phone]  If 7PM-7AM, please contact night-coverage [email or web address] Password [facility] 01/12/2016, 3:17 PM

## 2016-01-12 NOTE — Procedures (Signed)
I have seen and examined this patient and agree with the plan of care   . Patient is seen on dialysis and has no complaints and BP stable  Using dialysis catheter  [facility] 01/12/2016, 8:27 AM

## 2016-01-13 LAB — RENAL FUNCTION PANEL
ALBUMIN: 1.3 g/dL — AB (ref 3.5–5.0)
ANION GAP: 10 (ref 5–15)
BUN: 21 mg/dL — ABNORMAL HIGH (ref 6–20)
CO2: 26 mmol/L (ref 22–32)
Calcium: 7.8 mg/dL — ABNORMAL LOW (ref 8.9–10.3)
Chloride: 100 mmol/L — ABNORMAL LOW (ref 101–111)
Creatinine, Ser: 3.8 mg/dL — ABNORMAL HIGH (ref 0.44–1.00)
GFR, EST AFRICAN AMERICAN: 16 mL/min — AB (ref >60)
GFR, EST NON AFRICAN AMERICAN: 14 mL/min — AB (ref >60)
Glucose, Bld: 79 mg/dL (ref 65–99)
PHOSPHORUS: 4.5 mg/dL (ref 2.5–4.6)
POTASSIUM: 4.6 mmol/L (ref 3.5–5.1)
Sodium: 136 mmol/L (ref 135–145)

## 2016-01-13 LAB — CBC
HCT: 28.3 % — ABNORMAL LOW (ref 36.0–46.0)
HEMOGLOBIN: 8.5 g/dL — AB (ref 12.0–15.0)
MCH: 28.6 pg (ref 26.0–34.0)
MCHC: 30 g/dL (ref 30.0–36.0)
MCV: 95.3 fL (ref 78.0–100.0)
PLATELETS: 148 10*3/uL — AB (ref 150–400)
RBC: 2.97 MIL/uL — ABNORMAL LOW (ref 3.87–5.11)
RDW: 17.2 % — AB (ref 11.5–15.5)
WBC: 12 10*3/uL — AB (ref 4.0–10.5)

## 2016-01-13 NOTE — Consult Note (Signed)
[facility]  Subjective  - no questions about Monday   Objective 139/85 79 98.3 F (36.8 C) (Oral) 18 96%  Intake/Output Summary (Last 24 hours) at 01/13/16 1443 Last data filed at 01/13/16 1300  Gross per 24 hour  Intake    890 ml  Output      0 ml  Net    890 ml     Assessment/Planning: AV graft by Dr Donnetta Hutching on Monday  Sandy [name] 01/13/2016 2:43 PM --  Laboratory Lab Results:  Recent Labs  01/12/16 0515 01/13/16 0257  WBC 9.9 12.0*  HGB 8.2* 8.5*  HCT 27.1* 28.3*  PLT 166 148*   BMET  Recent Labs  01/12/16 0515 01/13/16 0257  NA 138 136  K 3.6 4.6  CL 102 100*  CO2 25 26  GLUCOSE 82 79  BUN 36* 21*  CREATININE 4.21* 3.80*  CALCIUM 7.9* 7.8*    COAG Lab Results  Component Value Date   INR 0.96 01/08/2016   INR 0.95 10/20/2015   No results found for: PTT

## 2016-01-13 NOTE — Progress Notes (Addendum)
01/13/2016 Hemodialysis Outpatient Note; This patient has been accepted at the [place] center [facility]) on a Monday, Wednesday and Friday 2nd shift schedule.The patient has been instructed to arrive at 11:10 on her 1st day of treatment. Thank you. Gordy Savers  01/22/2016 8:32 AM Hemo update;pending medical director approval. Gordy Savers

## 2016-01-13 NOTE — Progress Notes (Signed)
PROGRESS NOTE    Sandy [name]  [id number] DOB: Aug 18, 1976 DOA: 01/05/2016 PCP: No PCP Per Patient   Outpatient Specialists:  Dr. Donnetta Hutching   Brief Narrative:  Patient admitted with anasarca, progression of CKD. She was started on HD. Her first HD was 4-17. Plan per nephrology. She will need clipping.     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 -vascular plans Left upper arm AV graft if fistula not salvageable on Monday 0000000  Metabolic acidosis:  sodium bicarb to TID.    Atypical chest pain --troponin, first negative --Echo EF 60 % --EKG; peak T wave.   Anemia of chronic disease (renal failure) -transfuse for <7 -defer to renal  Accelerated HTN --Continue home medications --IV hydralazine prn --Hydralazine, labetalol.     DVT prophylaxis:  SQ Heparin  Code Status: Full   Family Communication: No family at bedside  Disposition Plan:     Consultants:   Nephrology   Procedures:        Subjective: Did not sleep well last PM  Objective: Filed Vitals:   01/12/16 1633 01/12/16 2018 01/13/16 0459 01/13/16 1247  BP: 154/94 127/75 154/103 139/85  Pulse: 83 85 79 79  Temp: 98.4 F (36.9 C) 98.9 F (37.2 C) 98.9 F (37.2 C) 98.3 F (36.8 C)  TempSrc: Oral Oral Oral Oral  Resp: 17   18  Height:      Weight:      SpO2: 97% 98% 100% 96%    Intake/Output Summary (Last 24 hours) at 01/13/16 1419 Last data filed at 01/13/16 1300  Gross per 24 hour  Intake   1130 ml  Output      0 ml  Net   1130 ml    Filed Weights   01/10/16 1709 01/12/16 0640 01/12/16 1014  Weight: 99.4 kg (219 lb 2.2 oz) 100.4 kg (221 lb 5.5 oz) 97.5 kg (214 lb 15.2 oz)    Examination:  General exam: Appears calm and comfortable  Central nervous system: Alert and oriented. No focal neurological deficits. Psychiatry: Judgement and insight appear normal. Mood & affect appropriate.     Data Reviewed: I have personally reviewed following labs and imaging studies  CBC:  Recent Labs Lab 01/09/16 1022 01/10/16 0603 01/11/16 0306 01/12/16 0515 01/13/16 0257  WBC 9.3 8.8 10.4 9.9 12.0*  HGB 8.2* 8.1* 8.8* 8.2* 8.5*  HCT 27.0* 26.5* 29.3* 27.1* 28.3*  MCV 93.4 93.0 93.0 94.1 95.3  PLT 258 255 157 166 [id number]*   Basic Metabolic Panel:  Recent Labs Lab 01/09/16 1022 01/10/16 0603 01/11/16 0306 01/12/16 0515 01/13/16 0257  NA 138 139 139 138 136  K 3.6 3.4* 3.9 3.6 4.6  CL 107 105 103 102 100*  CO2 21* 22 24 25 26   GLUCOSE 92 74 98 82 79  BUN 49* 51* 31* 36* 21*  CREATININE 3.75* 4.14* 3.42* 4.21* 3.80*  CALCIUM 8.0* 7.9* 7.8* 7.9* 7.8*  PHOS 7.2* 7.4* 5.4* 6.2* 4.5   GFR: Estimated Creatinine Clearance: 23.9 mL/min (by C-G formula based on Cr  of 3.8). Liver Function Tests:  Recent Labs Lab 01/09/16 1022 01/10/16 0603 01/11/16 0306 01/12/16 0515 01/13/16 0257  ALBUMIN 1.4* 1.4* 1.4* 1.4* 1.3*   No results for input(s): LIPASE, AMYLASE in the last 168 hours. No results for input(s): AMMONIA in the last 168 hours. Coagulation Profile:  Recent Labs Lab 01/08/16 0518  INR 0.96   Cardiac Enzymes: No results for input(s): CKTOTAL, CKMB, CKMBINDEX, TROPONINI in the last 168 hours. BNP (last 3 results) No results for input(s): PROBNP in the last 8760 hours. HbA1C: No results for input(s): HGBA1C in the last 72 hours. CBG:  Recent Labs Lab 01/06/16 2308 01/11/16 2026  GLUCAP 115* 114*   Lipid Profile: No results for input(s): CHOL, HDL, LDLCALC, TRIG, CHOLHDL, LDLDIRECT in the  last 72 hours. Thyroid Function Tests: No results for input(s): TSH, T4TOTAL, FREET4, T3FREE, THYROIDAB in the last 72 hours. Anemia Panel: No results for input(s): VITAMINB12, FOLATE, FERRITIN, TIBC, IRON, RETICCTPCT in the last 72 hours. Urine analysis:    Component Value Date/Time   COLORURINE YELLOW 10/14/2015 1604   APPEARANCEUR CLOUDY* 10/14/2015 1604   LABSPEC 1.027 10/14/2015 1604   PHURINE 5.5 10/14/2015 [place] 10/14/2015 1604   HGBUR NEGATIVE 10/14/2015 [place] 10/14/2015 [place] 10/14/2015 1604   PROTEINUR >300* 10/14/2015 1604   NITRITE NEGATIVE 10/14/2015 1604   LEUKOCYTESUR NEGATIVE 10/14/2015 1604     Recent Results (from the past 240 hour(s))  Surgical pcr screen     Status: Abnormal   Collection Time: 01/07/16 10:08 PM  Result Value Ref Range Status   MRSA, PCR NEGATIVE NEGATIVE Final   Staphylococcus aureus POSITIVE (A) NEGATIVE Final    Comment:        The Xpert SA Assay (FDA approved for NASAL specimens in patients over 8 years of age), is one component of a comprehensive surveillance program.  Test performance has been validated by [facility] for patients greater than or equal to 64 year old. It is not intended to diagnose infection nor to guide or monitor treatment.       Anti-infectives    Start     Dose/Rate Route Frequency Ordered Stop   01/08/16 1200  cefUROXime (ZINACEF) 1.5 g in dextrose 5 % 50 mL IVPB     1.5 g 100 mL/hr over 30 Minutes Intravenous To Short Stay 01/07/16 1958 01/08/16 1245   01/08/16 0000  ceFAZolin (ANCEF) IVPB 1 g/50 mL premix  Status:  Discontinued    Comments:  Send with pt to OR   1 g 100 mL/hr over 30 Minutes Intravenous On call 01/07/16 1958 01/07/16 2010       Radiology Studies: No results found.      Scheduled Meds: . darbepoetin (ARANESP) injection - NON-DIALYSIS  100 mcg Subcutaneous Q Sat-1800  . febuxostat  40 mg Oral Daily  . heparin   5,000 Units Subcutaneous 3 times per day  . hydrALAZINE  50 mg Oral 3 times per day  . labetalol  200 mg Oral BID  . predniSONE  5 mg Oral Q breakfast  . sodium bicarbonate  650 mg Oral TID  . sodium chloride flush  3 mL Intravenous Q12H   Continuous Infusions: . sodium chloride 10 mL/hr at 01/09/16 0537     LOS: 8 days    Time spent: 15 min    Ney, DO [facility] Pager [phone]  If 7PM-7AM, please contact night-coverage [email or web address] Password [facility] 01/13/2016,  2:19 PM

## 2016-01-13 NOTE — Progress Notes (Signed)
[facility] ROUNDING NOTE   Subjective:   Interval History:  Dialysis dependent nephrotic syndrome.    Patient reports that she feels better than yesterday.   She continues to have LE edema but notes it seems to be getting better.  No other concerns this am.  Objective:  Vital signs in last 24 hours:  Temp:  [98.4 F (36.9 C)-98.9 F (37.2 C)] 98.9 F (37.2 C) (04/22 0459) Pulse Rate:  [59-96] 79 (04/22 0459) Resp:  [17-20] 17 (04/21 1633) BP: (127-161)/(75-103) 154/103 mmHg (04/22 0459) SpO2:  [95 %-100 %] 100 % (04/22 0459) Weight:  [214 lb 15.2 oz (97.5 kg)] 214 lb 15.2 oz (97.5 kg) (04/21 1014)  Weight change: -6 lb 6.3 oz (-2.9 kg) Filed Weights   01/10/16 1709 01/12/16 0640 01/12/16 1014  Weight: 219 lb 2.2 oz (99.4 kg) 221 lb 5.5 oz (100.4 kg) 214 lb 15.2 oz (97.5 kg)    Intake/Output: I/O last 3 completed shifts: In: 32 [P.O.:540; IV Piggyback:110] Out: 3000 [Other:3000]   Intake/Output this shift:     Gen: awake, alert, sitting up in bed CVS- RRR, no murmurs RS- CTAB, breathing normally on room air ABD- BS+, soft,  non-distended EXT- compression hose in place, appears to have 1-2+ LE edema  Basic Metabolic Panel:  Recent Labs Lab 01/09/16 1022 01/10/16 0603 01/11/16 0306 01/12/16 0515 01/13/16 0257  NA 138 139 139 138 136  K 3.6 3.4* 3.9 3.6 4.6  CL 107 105 103 102 100*  CO2 21* 22 24 25 26   GLUCOSE 92 74 98 82 79  BUN 49* 51* 31* 36* 21*  CREATININE 3.75* 4.14* 3.42* 4.21* 3.80*  CALCIUM 8.0* 7.9* 7.8* 7.9* 7.8*  PHOS 7.2* 7.4* 5.4* 6.2* 4.5    Liver Function Tests:  Recent Labs Lab 01/09/16 1022 01/10/16 0603 01/11/16 0306 01/12/16 0515 01/13/16 0257  ALBUMIN 1.4* 1.4* 1.4* 1.4* 1.3*   No results for input(s): LIPASE, AMYLASE in the last 168 hours. No results for input(s): AMMONIA in the last 168 hours.  CBC:  Recent Labs Lab 01/09/16 1022 01/10/16 0603 01/11/16 0306 01/12/16 0515 01/13/16 0257  WBC 9.3 8.8  10.4 9.9 12.0*  HGB 8.2* 8.1* 8.8* 8.2* 8.5*  HCT 27.0* 26.5* 29.3* 27.1* 28.3*  MCV 93.4 93.0 93.0 94.1 95.3  PLT 258 255 157 166 148*    Cardiac Enzymes:  Recent Labs Lab 01/06/16 1335  TROPONINI <0.03    BNP: Invalid input(s): POCBNP  CBG:  Recent Labs Lab 01/06/16 2308 01/11/16 2026  GLUCAP 115* 114*    Microbiology: Results for orders placed or performed during the hospital encounter of 01/05/16  Surgical pcr screen     Status: Abnormal   Collection Time: 01/07/16 10:08 PM  Result Value Ref Range Status   MRSA, PCR NEGATIVE NEGATIVE Final   Staphylococcus aureus POSITIVE (A) NEGATIVE Final    Comment:        The Xpert SA Assay (FDA approved for NASAL specimens in patients over 92 years of age), is one component of a comprehensive surveillance program.  Test performance has been validated by [facility] for patients greater than or equal to 25 year old. It is not intended to diagnose infection nor to guide or monitor treatment.     Coagulation Studies: No results for input(s): LABPROT, INR in the last 72 hours.  Urinalysis: No results for input(s): COLORURINE, LABSPEC, PHURINE, GLUCOSEU, HGBUR, BILIRUBINUR, KETONESUR, PROTEINUR, UROBILINOGEN, NITRITE, LEUKOCYTESUR in the last 72 hours.  Invalid input(s): APPERANCEUR  Imaging: No results found.   Medications:   . sodium chloride 10 mL/hr at 01/09/16 0537   . Chlorhexidine Gluconate Cloth  6 each Topical Daily  . darbepoetin (ARANESP) injection - NON-DIALYSIS  100 mcg Subcutaneous Q Sat-1800  . febuxostat  40 mg Oral Daily  . ferric gluconate (FERRLECIT/NULECIT) IV  125 mg Intravenous Daily  . heparin  5,000 Units Subcutaneous 3 times per day  . hydrALAZINE  50 mg Oral 3 times per day  . labetalol  200 mg Oral BID  . mupirocin ointment  1 application Nasal BID  . predniSONE  5 mg Oral Q breakfast  . sodium bicarbonate  650 mg Oral TID  . sodium chloride flush  3 mL Intravenous Q12H    acetaminophen, hydrALAZINE, HYDROcodone-acetaminophen, ondansetron **OR** ondansetron (ZOFRAN) IV, oxyCODONE, promethazine  Assessment/ Plan:   Assessment/Plan: 40 year old black female with a prolonged course with FSG that's been refractory to treatment now presenting with worsening volume status in the setting of weaning steroids and also uremic symptoms that will necessitate initiation of dialysis   1.Renal- advanced CKD secondary to FSG and also volume overload that's refractory to treatment. has R IJ HD catheter placement 4/17. Had HD yesterday. 3L taken off. K stable this am 4.6.  Scheduled for permanent access/ AVF exploration on Monday 4/24. 2. Hypertension/volume - Continues to be volume overloaded. BP elevated 120-180/70-100s. She is on labetalol and scheduled hydralazine. Continue fluid removal as planned. 3. Pains in her legs- On uloric 4. Anemia - Hgb  8.5 getting ferric gluconate daliy and next dose aranesp sat 5. Bones- last PTH 132 as an OP- no meds needed     LOS: 8 Ashly Gottschalk, DO 01/13/16 @9 :20 AM

## 2016-01-14 LAB — RENAL FUNCTION PANEL
ANION GAP: 11 (ref 5–15)
Albumin: 1.5 g/dL — ABNORMAL LOW (ref 3.5–5.0)
BUN: 31 mg/dL — ABNORMAL HIGH (ref 6–20)
CALCIUM: 8 mg/dL — AB (ref 8.9–10.3)
CO2: 26 mmol/L (ref 22–32)
CREATININE: 4.77 mg/dL — AB (ref 0.44–1.00)
Chloride: 100 mmol/L — ABNORMAL LOW (ref 101–111)
GFR calc Af Amer: 12 mL/min — ABNORMAL LOW (ref >60)
GFR calc non Af Amer: 11 mL/min — ABNORMAL LOW (ref >60)
GLUCOSE: 74 mg/dL (ref 65–99)
Phosphorus: 6.1 mg/dL — ABNORMAL HIGH (ref 2.5–4.6)
Potassium: 4.2 mmol/L (ref 3.5–5.1)
SODIUM: 137 mmol/L (ref 135–145)

## 2016-01-14 LAB — CBC
HCT: 28 % — ABNORMAL LOW (ref 36.0–46.0)
HEMOGLOBIN: 8.2 g/dL — AB (ref 12.0–15.0)
MCH: 28.1 pg (ref 26.0–34.0)
MCHC: 29.3 g/dL — AB (ref 30.0–36.0)
MCV: 95.9 fL (ref 78.0–100.0)
Platelets: 152 10*3/uL (ref 150–400)
RBC: 2.92 MIL/uL — ABNORMAL LOW (ref 3.87–5.11)
RDW: 17.4 % — ABNORMAL HIGH (ref 11.5–15.5)
WBC: 9.9 10*3/uL (ref 4.0–10.5)

## 2016-01-14 MED ORDER — DEXTROSE 5 % IV SOLN: 1.5000 g | INTRAVENOUS | Status: DC

## 2016-01-14 MED ORDER — DEXTROSE 5 % IV SOLN
1.5000 g | INTRAVENOUS | Status: AC
  Administered 2016-01-15: 1.5 g via INTRAVENOUS
  Filled 2016-01-14: qty 1.5

## 2016-01-14 NOTE — Progress Notes (Signed)
      Pre-op for left av graft for tomorrow NPO p MN  Zaidin Blyden MAUREEN PA-C

## 2016-01-14 NOTE — Progress Notes (Signed)
[facility] ROUNDING NOTE   Subjective:   Interval History:  Dialysis dependent nephrotic syndrome.    Patient reports feeling well.  She continues to have LE edema but improved.  No other concerns this am.  Objective:  Vital signs in last 24 hours:  Temp:  [98.1 F (36.7 C)-98.8 F (37.1 C)] 98.8 F (37.1 C) (04/23 1002) Pulse Rate:  [72-83] 72 (04/23 1002) Resp:  [17-19] 18 (04/23 1002) BP: (112-177)/(61-104) 112/61 mmHg (04/23 1002) SpO2:  [94 %-100 %] 94 % (04/23 1002)  Weight change:  Filed Weights   01/10/16 1709 01/12/16 0640 01/12/16 1014  Weight: 219 lb 2.2 oz (99.4 kg) 221 lb 5.5 oz (100.4 kg) 214 lb 15.2 oz (97.5 kg)    Intake/Output: I/O last 3 completed shifts: In: 50 [P.O.:1500; IV Piggyback:110] Out: 200 [Urine:200]   Intake/Output this shift:  Total I/O In: 120 [P.O.:120] Out: 0   Gen: Laying in bed, alert, NAD CVS- RRR, no murmurs RS- CTAB, breathing normally on room air ABD- BS+, soft,  non-distended EXT- compression hose in place, appears to have 1-2+ LE edema  Basic Metabolic Panel:  Recent Labs Lab 01/10/16 0603 01/11/16 0306 01/12/16 0515 01/13/16 0257 01/14/16 0453  NA 139 139 138 136 137  K 3.4* 3.9 3.6 4.6 4.2  CL 105 103 102 100* 100*  CO2 22 24 25 26 26   GLUCOSE 74 98 82 79 74  BUN 51* 31* 36* 21* 31*  CREATININE 4.14* 3.42* 4.21* 3.80* 4.77*  CALCIUM 7.9* 7.8* 7.9* 7.8* 8.0*  PHOS 7.4* 5.4* 6.2* 4.5 6.1*    Liver Function Tests:  Recent Labs Lab 01/10/16 0603 01/11/16 0306 01/12/16 0515 01/13/16 0257 01/14/16 0453  ALBUMIN 1.4* 1.4* 1.4* 1.3* 1.5*   No results for input(s): LIPASE, AMYLASE in the last 168 hours. No results for input(s): AMMONIA in the last 168 hours.  CBC:  Recent Labs Lab 01/10/16 0603 01/11/16 0306 01/12/16 0515 01/13/16 0257 01/14/16 0453  WBC 8.8 10.4 9.9 12.0* 9.9  HGB 8.1* 8.8* 8.2* 8.5* 8.2*  HCT 26.5* 29.3* 27.1* 28.3* 28.0*  MCV 93.0 93.0 94.1 95.3 95.9  PLT  255 157 166 148* 152    Cardiac Enzymes: No results for input(s): CKTOTAL, CKMB, CKMBINDEX, TROPONINI in the last 168 hours.  BNP: Invalid input(s): POCBNP  CBG:  Recent Labs Lab 01/11/16 2026  GLUCAP 114*    Microbiology: Results for orders placed or performed during the hospital encounter of 01/05/16  Surgical pcr screen     Status: Abnormal   Collection Time: 01/07/16 10:08 PM  Result Value Ref Range Status   MRSA, PCR NEGATIVE NEGATIVE Final   Staphylococcus aureus POSITIVE (A) NEGATIVE Final    Comment:        The Xpert SA Assay (FDA approved for NASAL specimens in patients over 20 years of age), is one component of a comprehensive surveillance program.  Test performance has been validated by [facility] for patients greater than or equal to 90 year old. It is not intended to diagnose infection nor to guide or monitor treatment.     Coagulation Studies: No results for input(s): LABPROT, INR in the last 72 hours.  Urinalysis: No results for input(s): COLORURINE, LABSPEC, PHURINE, GLUCOSEU, HGBUR, BILIRUBINUR, KETONESUR, PROTEINUR, UROBILINOGEN, NITRITE, LEUKOCYTESUR in the last 72 hours.  Invalid input(s): APPERANCEUR    Imaging: No results found.   Medications:   . sodium chloride 10 mL/hr at 01/09/16 0537   . darbepoetin (ARANESP) injection - NON-DIALYSIS  100 mcg Subcutaneous Q Sat-1800  . febuxostat  40 mg Oral Daily  . heparin  5,000 Units Subcutaneous 3 times per day  . hydrALAZINE  50 mg Oral 3 times per day  . labetalol  200 mg Oral BID  . predniSONE  5 mg Oral Q breakfast  . 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 has R IJ HD catheter placement 4/17. Had HD yesterday. 3L taken off. K stable this am 4.2.  Scheduled for permanent access/ AVF exploration on Monday 4/24. Make NPO after MN. 2. Hypertension/volume - Continues to be volume overloaded. BP elevated 120-180/70-100s. She is on labetalol and scheduled hydralazine. Continue fluid removal as planned. 3. Pains in her legs- On uloric 4. Anemia - Hgb  8.2 getting ferric gluconate daliy and next dose aranesp sat 5. Bones- last PTH 132 as an OP- no meds needed     LOS: [place], DO 01/13/16 @12 :24 PM

## 2016-01-14 NOTE — Progress Notes (Signed)
PROGRESS NOTE    Sandy [name]  [id number] DOB: 1976-01-13 DOA: 01/05/2016 PCP: No PCP Per Patient   Outpatient Specialists:  Dr. Donnetta Hutching   Brief Narrative:  Patient admitted with anasarca, progression of CKD. She was started on HD. Her first HD was 4-17. Plan per nephrology. She will need clipping.     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 -vascular plans Left upper arm AV graft if fistula not salvageable on Monday 0000000  Metabolic acidosis:  sodium bicarb to TID.    Atypical chest pain --troponin, first negative --Echo EF 60 % --EKG; peak T wave.   Anemia of chronic disease (renal failure) -transfuse for <7 -defer to renal  Accelerated HTN --Continue home medications --IV hydralazine prn --Hydralazine, labetalol.     DVT prophylaxis:  SQ Heparin  Code Status: Full   Family Communication: No family at bedside  Disposition Plan:     Consultants:   Nephrology  vascular   Procedures:        Subjective: resting  Objective: Filed Vitals:   01/13/16 1512 01/13/16 2051 01/14/16 0511 01/14/16 1002  BP: 133/79 146/93 177/104 112/61  Pulse: 78 83 77 72  Temp: 98.1 F (36.7 C) 98.3 F (36.8 C) 98.6 F (37 C) 98.8 F (37.1 C)  TempSrc: Oral Oral Oral Oral  Resp: 17 19 19 18   Height:      Weight:      SpO2: 97% 100% 100% 94%    Intake/Output Summary (Last 24 hours) at 01/14/16 1402 Last data filed at 01/14/16 1105  Gross per 24 hour  Intake   1080 ml  Output    200 ml  Net    880 ml    Filed Weights   01/10/16 1709 01/12/16 0640 01/12/16 1014  Weight: 99.4 kg (219 lb 2.2 oz) 100.4 kg (221 lb 5.5 oz) 97.5 kg (214 lb 15.2 oz)    Examination:  General exam: sleeping    Data Reviewed: I have personally reviewed following labs and imaging studies  CBC:  Recent Labs Lab 01/10/16 0603 01/11/16 0306 01/12/16 0515 01/13/16 0257 01/14/16 0453  WBC 8.8 10.4 9.9 12.0* 9.9  HGB 8.1* 8.8* 8.2* 8.5* 8.2*  HCT 26.5* 29.3* 27.1* 28.3* 28.0*  MCV 93.0 93.0 94.1 95.3 95.9  PLT 255 157 166 148* 0000000   Basic Metabolic Panel:  Recent Labs Lab 01/10/16 0603 01/11/16 0306 01/12/16 0515 01/13/16 0257 01/14/16 0453  NA 139 139 138 136 137  K 3.4* 3.9 3.6 4.6 4.2  CL 105 103 102 100* 100*  CO2 22 24 25 26 26   GLUCOSE 74 98 82 79 74  BUN 51* 31* 36* 21* 31*  CREATININE 4.14* 3.42* 4.21* 3.80* 4.77*  CALCIUM 7.9* 7.8* 7.9* 7.8* 8.0*  PHOS 7.4* 5.4* 6.2* 4.5 6.1*   GFR: Estimated Creatinine Clearance: 19 mL/min (by C-G formula based on Cr of 4.77). Liver Function Tests:  Recent Labs Lab 01/10/16 0603 01/11/16 0306 01/12/16 0515 01/13/16 0257 01/14/16 0453  ALBUMIN 1.4* 1.4* 1.4* 1.3* 1.5*   No  results for input(s): LIPASE, AMYLASE in the last 168 hours. No results for input(s): AMMONIA in the last 168 hours. Coagulation Profile:  Recent Labs Lab 01/08/16 0518  INR 0.96   Cardiac Enzymes: No results for input(s): CKTOTAL, CKMB, CKMBINDEX, TROPONINI in the last 168 hours. BNP (last 3 results) No results for input(s): PROBNP in the last 8760 hours. HbA1C: No results for input(s): HGBA1C in the last 72 hours. CBG:  Recent Labs Lab 01/11/16 2026  GLUCAP 114*   Lipid Profile: No results for input(s): CHOL, HDL, LDLCALC, TRIG, CHOLHDL, LDLDIRECT in the last 72 hours. Thyroid Function Tests: No results for input(s): TSH, T4TOTAL, FREET4, T3FREE, THYROIDAB in the last 72 hours. Anemia Panel: No results for input(s): VITAMINB12, FOLATE, FERRITIN,  TIBC, IRON, RETICCTPCT in the last 72 hours. Urine analysis:    Component Value Date/Time   COLORURINE YELLOW 10/14/2015 1604   APPEARANCEUR CLOUDY* 10/14/2015 1604   LABSPEC 1.027 10/14/2015 1604   PHURINE 5.5 10/14/2015 [place] 10/14/2015 1604   HGBUR NEGATIVE 10/14/2015 [place] 10/14/2015 [place] 10/14/2015 1604   PROTEINUR >300* 10/14/2015 1604   NITRITE NEGATIVE 10/14/2015 1604   LEUKOCYTESUR NEGATIVE 10/14/2015 1604     Recent Results (from the past 240 hour(s))  Surgical pcr screen     Status: Abnormal   Collection Time: 01/07/16 10:08 PM  Result Value Ref Range Status   MRSA, PCR NEGATIVE NEGATIVE Final   Staphylococcus aureus POSITIVE (A) NEGATIVE Final    Comment:        The Xpert SA Assay (FDA approved for NASAL specimens in patients over 35 years of age), is one component of a comprehensive surveillance program.  Test performance has been validated by [facility] for patients greater than or equal to 77 year old. It is not intended to diagnose infection nor to guide or monitor treatment.       Anti-infectives    Start     Dose/Rate Route Frequency Ordered Stop   01/08/16 1200  cefUROXime (ZINACEF) 1.5 g in dextrose 5 % 50 mL IVPB     1.5 g 100 mL/hr over 30 Minutes Intravenous To Short Stay 01/07/16 1958 01/08/16 1245   01/08/16 0000  ceFAZolin (ANCEF) IVPB 1 g/50 mL premix  Status:  Discontinued    Comments:  Send with pt to OR   1 g 100 mL/hr over 30 Minutes Intravenous On call 01/07/16 1958 01/07/16 2010       Radiology Studies: No results found.      Scheduled Meds: . darbepoetin (ARANESP) injection - NON-DIALYSIS  100 mcg Subcutaneous Q Sat-1800  . febuxostat  40 mg Oral Daily  . heparin  5,000 Units Subcutaneous 3 times per day  . hydrALAZINE  50 mg Oral 3 times per day  . labetalol  200 mg Oral BID  . predniSONE  5 mg Oral Q breakfast  . sodium chloride flush  3 mL Intravenous  Q12H   Continuous Infusions: . sodium chloride 10 mL/hr at 01/09/16 0537     LOS: 9 days    Time spent: 15 min    [place], DO [facility] Pager [phone]  If 7PM-7AM, please contact night-coverage [email or web address] Password [email or web address] 01/14/2016, 2:02 PM

## 2016-01-15 ENCOUNTER — Encounter (HOSPITAL_COMMUNITY): Payer: Self-pay | Admitting: Anesthesiology

## 2016-01-15 ENCOUNTER — Inpatient Hospital Stay (HOSPITAL_COMMUNITY): Payer: Medicaid Other | Admitting: Anesthesiology

## 2016-01-15 ENCOUNTER — Encounter (HOSPITAL_COMMUNITY)
Admission: EM | Disposition: A | Discharge status: Home / Self Care | Payer: Self-pay | Source: Home / Self Care | Attending: Internal Medicine

## 2016-01-15 HISTORY — PX: AV FISTULA PLACEMENT: SHX1204

## 2016-01-15 LAB — BASIC METABOLIC PANEL
ANION GAP: 13 (ref 5–15)
BUN: 40 mg/dL — ABNORMAL HIGH (ref 6–20)
CALCIUM: 8.1 mg/dL — AB (ref 8.9–10.3)
CO2: 26 mmol/L (ref 22–32)
Chloride: 100 mmol/L — ABNORMAL LOW (ref 101–111)
Creatinine, Ser: 5.13 mg/dL — ABNORMAL HIGH (ref 0.44–1.00)
GFR, EST AFRICAN AMERICAN: 11 mL/min — AB (ref >60)
GFR, EST NON AFRICAN AMERICAN: 10 mL/min — AB (ref >60)
Glucose, Bld: 77 mg/dL (ref 65–99)
Potassium: 4.2 mmol/L (ref 3.5–5.1)
SODIUM: 139 mmol/L (ref 135–145)

## 2016-01-15 LAB — CBC
HCT: 27 % — ABNORMAL LOW (ref 36.0–46.0)
HEMOGLOBIN: 8 g/dL — AB (ref 12.0–15.0)
MCH: 28.5 pg (ref 26.0–34.0)
MCHC: 29.6 g/dL — AB (ref 30.0–36.0)
MCV: 96.1 fL (ref 78.0–100.0)
Platelets: 140 10*3/uL — ABNORMAL LOW (ref 150–400)
RBC: 2.81 MIL/uL — AB (ref 3.87–5.11)
RDW: 17.4 % — ABNORMAL HIGH (ref 11.5–15.5)
WBC: 8.8 10*3/uL (ref 4.0–10.5)

## 2016-01-15 LAB — PROTIME-INR
INR: 0.94 (ref 0.00–1.49)
PROTHROMBIN TIME: 12.8 s (ref 11.6–15.2)

## 2016-01-15 LAB — ALBUMIN: ALBUMIN: 1.4 g/dL — AB (ref 3.5–5.0)

## 2016-01-15 LAB — PHOSPHORUS: PHOSPHORUS: 6.8 mg/dL — AB (ref 2.5–4.6)

## 2016-01-15 SURGERY — INSERTION OF ARTERIOVENOUS (AV) GORE-TEX GRAFT ARM
Anesthesia: General | Site: Arm Lower | Laterality: Left

## 2016-01-15 MED ORDER — OXYCODONE HCL 5 MG PO TABS
ORAL_TABLET | ORAL | Status: AC
  Filled 2016-01-15: qty 1

## 2016-01-15 MED ORDER — ROCURONIUM BROMIDE 50 MG/5ML IV SOLN
INTRAVENOUS | Status: AC
  Filled 2016-01-15: qty 1

## 2016-01-15 MED ORDER — MIDAZOLAM HCL 2 MG/2ML IJ SOLN
INTRAMUSCULAR | Status: DC | PRN
  Administered 2016-01-15: 2 mg via INTRAVENOUS

## 2016-01-15 MED ORDER — PROPOFOL 10 MG/ML IV BOLUS
INTRAVENOUS | Status: AC
  Filled 2016-01-15: qty 20

## 2016-01-15 MED ORDER — DEXTROSE 5 % IV SOLN: 1.5000 g | INTRAVENOUS | Status: DC | PRN

## 2016-01-15 MED ORDER — HYDROCODONE-ACETAMINOPHEN 5-325 MG PO TABS
ORAL_TABLET | ORAL | Status: AC
  Filled 2016-01-15: qty 1

## 2016-01-15 MED ORDER — ARTIFICIAL TEARS OP OINT
TOPICAL_OINTMENT | OPHTHALMIC | Status: AC
  Filled 2016-01-15: qty 3.5

## 2016-01-15 MED ORDER — ALTEPLASE 2 MG IJ SOLR: 2.0000 mg | Freq: Once | INTRAMUSCULAR | Status: DC | PRN

## 2016-01-15 MED ORDER — PHENYLEPHRINE HCL 10 MG/ML IJ SOLN
INTRAMUSCULAR | Status: DC | PRN
  Administered 2016-01-15: 40 ug via INTRAVENOUS
  Administered 2016-01-15: 80 ug via INTRAVENOUS
  Administered 2016-01-15: 40 ug via INTRAVENOUS

## 2016-01-15 MED ORDER — FENTANYL CITRATE (PF) 250 MCG/5ML IJ SOLN
INTRAMUSCULAR | Status: DC | PRN
  Administered 2016-01-15: 25 ug via INTRAVENOUS
  Administered 2016-01-15: 50 ug via INTRAVENOUS
  Administered 2016-01-15 (×4): 25 ug via INTRAVENOUS

## 2016-01-15 MED ORDER — MIDAZOLAM HCL 5 MG/5ML IJ SOLN: INTRAMUSCULAR | Status: DC | PRN

## 2016-01-15 MED ORDER — EPHEDRINE SULFATE 50 MG/ML IJ SOLN
INTRAMUSCULAR | Status: AC
  Filled 2016-01-15: qty 1

## 2016-01-15 MED ORDER — LIDOCAINE HCL (CARDIAC) 20 MG/ML IV SOLN
INTRAVENOUS | Status: AC
  Filled 2016-01-15: qty 5

## 2016-01-15 MED ORDER — ONDANSETRON HCL 4 MG/2ML IJ SOLN
INTRAMUSCULAR | Status: AC
Start: 2016-01-15 — End: 2016-01-15
  Filled 2016-01-15: qty 2

## 2016-01-15 MED ORDER — OXYCODONE HCL 5 MG PO TABS
10.0000 mg | ORAL_TABLET | Freq: Once | ORAL | Status: AC
  Administered 2016-01-15: 10 mg via ORAL
  Filled 2016-01-15: qty 2

## 2016-01-15 MED ORDER — LIDOCAINE HCL (CARDIAC) 20 MG/ML IV SOLN
INTRAVENOUS | Status: DC | PRN
  Administered 2016-01-15: 40 mg via INTRAVENOUS

## 2016-01-15 MED ORDER — LIDOCAINE-EPINEPHRINE 0.5 %-1:200000 IJ SOLN
INTRAMUSCULAR | Status: AC
  Filled 2016-01-15: qty 1

## 2016-01-15 MED ORDER — HEPARIN SODIUM (PORCINE) 1000 UNIT/ML IJ SOLN
INTRAMUSCULAR | Status: DC | PRN
  Administered 2016-01-15: 5000 [IU] via INTRAVENOUS

## 2016-01-15 MED ORDER — HEPARIN SODIUM (PORCINE) 1000 UNIT/ML IJ SOLN
INTRAMUSCULAR | Status: AC
  Filled 2016-01-15: qty 2

## 2016-01-15 MED ORDER — ONDANSETRON HCL 4 MG/2ML IJ SOLN
INTRAMUSCULAR | Status: DC | PRN
  Administered 2016-01-15: 4 mg via INTRAVENOUS

## 2016-01-15 MED ORDER — PROPOFOL 10 MG/ML IV BOLUS
INTRAVENOUS | Status: DC | PRN
  Administered 2016-01-15: 120 mg via INTRAVENOUS

## 2016-01-15 MED ORDER — OXYCODONE HCL 5 MG PO TABS: 5.0000 mg | ORAL_TABLET | Freq: Once | ORAL | Status: DC | PRN

## 2016-01-15 MED ORDER — SODIUM CHLORIDE 0.9 % IJ SOLN
INTRAMUSCULAR | Status: AC
  Filled 2016-01-15: qty 10

## 2016-01-15 MED ORDER — LIDOCAINE HCL (PF) 1 % IJ SOLN: 5.0000 mL | INTRAMUSCULAR | Status: DC | PRN

## 2016-01-15 MED ORDER — SODIUM CHLORIDE 0.9 % IV SOLN: 100.0000 mL | INTRAVENOUS | Status: DC | PRN

## 2016-01-15 MED ORDER — HEPARIN SODIUM (PORCINE) 1000 UNIT/ML DIALYSIS: 1000.0000 [IU] | INTRAMUSCULAR | Status: DC | PRN

## 2016-01-15 MED ORDER — DEXTROSE 5 % IV SOLN: 0.5000 g | INTRAVENOUS | Status: DC | PRN

## 2016-01-15 MED ORDER — SODIUM CHLORIDE 0.9 % IV SOLN
INTRAVENOUS | Status: DC | PRN
  Administered 2016-01-15: 500 mL

## 2016-01-15 MED ORDER — FENTANYL CITRATE (PF) 100 MCG/2ML IJ SOLN
INTRAMUSCULAR | Status: AC
  Filled 2016-01-15: qty 2

## 2016-01-15 MED ORDER — LIDOCAINE-EPINEPHRINE 0.5 %-1:200000 IJ SOLN
INTRAMUSCULAR | Status: DC | PRN
  Administered 2016-01-15: 50 mL

## 2016-01-15 MED ORDER — ONDANSETRON HCL 4 MG/2ML IJ SOLN: 4.0000 mg | Freq: Once | INTRAMUSCULAR | Status: DC | PRN

## 2016-01-15 MED ORDER — 0.9 % SODIUM CHLORIDE (POUR BTL) OPTIME
TOPICAL | Status: DC | PRN
  Administered 2016-01-15: 1000 mL

## 2016-01-15 MED ORDER — ONDANSETRON HCL 4 MG/2ML IJ SOLN
INTRAMUSCULAR | Status: AC
  Filled 2016-01-15: qty 2

## 2016-01-15 MED ORDER — FENTANYL CITRATE (PF) 250 MCG/5ML IJ SOLN
INTRAMUSCULAR | Status: AC
  Filled 2016-01-15: qty 5

## 2016-01-15 MED ORDER — OXYCODONE HCL 5 MG/5ML PO SOLN
5.0000 mg | Freq: Once | ORAL | Status: DC | PRN
Start: 2016-01-15 — End: 2016-01-15

## 2016-01-15 MED ORDER — MIDAZOLAM HCL 2 MG/2ML IJ SOLN
INTRAMUSCULAR | Status: AC
  Filled 2016-01-15: qty 2

## 2016-01-15 MED ORDER — PENTAFLUOROPROP-TETRAFLUOROETH EX AERO: 1.0000 "application " | INHALATION_SPRAY | CUTANEOUS | Status: DC | PRN

## 2016-01-15 MED ORDER — LIDOCAINE-PRILOCAINE 2.5-2.5 % EX CREA: 1.0000 "application " | TOPICAL_CREAM | CUTANEOUS | Status: DC | PRN

## 2016-01-15 MED ORDER — FENTANYL CITRATE (PF) 100 MCG/2ML IJ SOLN
25.0000 ug | INTRAMUSCULAR | Status: DC | PRN
Start: 2016-01-15 — End: 2016-01-15
  Administered 2016-01-15 (×2): 50 ug via INTRAVENOUS

## 2016-01-15 SURGICAL SUPPLY — 31 items
ARMBAND PINK RESTRICT EXTREMIT (MISCELLANEOUS) ×3 IMPLANT
BENZOIN TINCTURE PRP APPL 2/3 (GAUZE/BANDAGES/DRESSINGS) ×3 IMPLANT
CANISTER SUCTION 2500CC (MISCELLANEOUS) ×3 IMPLANT
CANNULA VESSEL 3MM 2 BLNT TIP (CANNULA) IMPLANT
CATH EMB 3FR 40CM (CATHETERS) ×3 IMPLANT
CLIP LIGATING EXTRA MED SLVR (CLIP) ×3 IMPLANT
CLIP LIGATING EXTRA SM BLUE (MISCELLANEOUS) ×3 IMPLANT
CLOSURE WOUND 1/2 X4 (GAUZE/BANDAGES/DRESSINGS) ×1
DECANTER SPIKE VIAL GLASS SM (MISCELLANEOUS) ×3 IMPLANT
ELECT REM PT RETURN 9FT ADLT (ELECTROSURGICAL) ×3
ELECTRODE REM PT RTRN 9FT ADLT (ELECTROSURGICAL) ×1 IMPLANT
GAUZE SPONGE 4X4 12PLY STRL (GAUZE/BANDAGES/DRESSINGS) ×3 IMPLANT
GEL ULTRASOUND 20GR AQUASONIC (MISCELLANEOUS) IMPLANT
GLOVE SS BIOGEL STRL SZ 7.5 (GLOVE) ×1 IMPLANT
GLOVE SUPERSENSE BIOGEL SZ 7.5 (GLOVE) ×2
GOWN STRL REUS W/ TWL LRG LVL3 (GOWN DISPOSABLE) ×3 IMPLANT
GOWN STRL REUS W/TWL LRG LVL3 (GOWN DISPOSABLE) ×6
KIT BASIN OR (CUSTOM PROCEDURE TRAY) ×3 IMPLANT
KIT ROOM TURNOVER OR (KITS) ×3 IMPLANT
NS IRRIG 1000ML POUR BTL (IV SOLUTION) ×3 IMPLANT
PACK CV ACCESS (CUSTOM PROCEDURE TRAY) ×3 IMPLANT
PAD ARMBOARD 7.5X6 YLW CONV (MISCELLANEOUS) ×6 IMPLANT
SPONGE GAUZE 4X4 12PLY STER LF (GAUZE/BANDAGES/DRESSINGS) ×3 IMPLANT
STRIP CLOSURE SKIN 1/2X4 (GAUZE/BANDAGES/DRESSINGS) ×2 IMPLANT
SUT PROLENE 6 0 CC (SUTURE) ×6 IMPLANT
SUT SILK 2 0 FS (SUTURE) IMPLANT
SUT VIC AB 3-0 SH 27 (SUTURE) ×4
SUT VIC AB 3-0 SH 27X BRD (SUTURE) ×2 IMPLANT
TAPE CLOTH SURG 4X10 WHT LF (GAUZE/BANDAGES/DRESSINGS) ×3 IMPLANT
UNDERPAD 30X30 INCONTINENT (UNDERPADS AND DIAPERS) ×3 IMPLANT
WATER STERILE IRR 1000ML POUR (IV SOLUTION) ×3 IMPLANT

## 2016-01-15 NOTE — Anesthesia Procedure Notes (Signed)
Procedure Name: LMA Insertion Date/Time: 01/15/2016 10:52 AM Performed by: Suzy Bouchard Pre-anesthesia Checklist: Patient identified, Emergency Drugs available, Patient being monitored, Suction available and Timeout performed Patient Re-evaluated:Patient Re-evaluated prior to inductionOxygen Delivery Method: Circle system utilized Preoxygenation: Pre-oxygenation with 100% oxygen Intubation Type: IV induction Ventilation: Mask ventilation without difficulty LMA: LMA inserted LMA Size: 4.0 Tube type: Oral Number of attempts: 1 Placement Confirmation: positive ETCO2 and breath sounds checked- equal and bilateral Tube secured with: Tape Dental Injury: Teeth and Oropharynx as per pre-operative assessment

## 2016-01-15 NOTE — Transfer of Care (Signed)
Immediate Anesthesia Transfer of Care Note  Patient: Sandy [name]  Procedure(s) Performed: Procedure(s): Embolectomy of Left Arm Fistula with revision of Brachiocephalic fistula and vein patch angioplasty (Left)  Patient Location: PACU  Anesthesia Type:General  Level of Consciousness: awake and alert   Airway & Oxygen Therapy: Patient Spontanous Breathing and Patient connected to nasal cannula oxygen  Post-op Assessment: Report given to RN and Post -op Vital signs reviewed and stable  Post vital signs: Reviewed and stable  Last Vitals:  Filed Vitals:   01/15/16 0513 01/15/16 0901  BP: 149/93   Pulse: 82 86  Temp: 36.9 C   Resp: 18     Complications: No apparent anesthesia complications

## 2016-01-15 NOTE — Progress Notes (Signed)
[facility] ROUNDING NOTE   Subjective:   Interval History:  Dialysis dependent nephrotic syndrome.  No UOP/24 hours  Patient off the floor for procedure.  Objective:  Vital signs in last 24 hours:  Temp:  [98.5 F (36.9 C)-99.2 F (37.3 C)] 98.5 F (36.9 C) (04/24 0513) Pulse Rate:  [72-86] 82 (04/24 0513) Resp:  [18] 18 (04/24 0513) BP: (112-182)/(61-93) 149/93 mmHg (04/24 0513) SpO2:  [94 %-98 %] 97 % (04/24 0513)  Weight change:  Filed Weights   01/10/16 1709 01/12/16 0640 01/12/16 1014  Weight: 219 lb 2.2 oz (99.4 kg) 221 lb 5.5 oz (100.4 kg) 214 lb 15.2 oz (97.5 kg)    Intake/Output: I/O last 3 completed shifts: In: 1440 [P.O.:1440] Out: 200 [Urine:200]   Intake/Output this shift:     Physical exam: Off the floor for procedure.  Will re attempt physical exam.  Basic Metabolic Panel:  Recent Labs Lab 01/11/16 0306 01/12/16 0515 01/13/16 0257 01/14/16 0453 01/15/16 0426  NA 139 138 136 137 139  K 3.9 3.6 4.6 4.2 4.2  CL 103 102 100* 100* 100*  CO2 24 25 26 26 26   GLUCOSE 98 82 79 74 77  BUN 31* 36* 21* 31* 40*  CREATININE 3.42* 4.21* 3.80* 4.77* 5.13*  CALCIUM 7.8* 7.9* 7.8* 8.0* 8.1*  PHOS 5.4* 6.2* 4.5 6.1* 6.8*    Liver Function Tests:  Recent Labs Lab 01/11/16 0306 01/12/16 0515 01/13/16 0257 01/14/16 0453 01/15/16 0426  ALBUMIN 1.4* 1.4* 1.3* 1.5* 1.4*   No results for input(s): LIPASE, AMYLASE in the last 168 hours. No results for input(s): AMMONIA in the last 168 hours.  CBC:  Recent Labs Lab 01/11/16 0306 01/12/16 0515 01/13/16 0257 01/14/16 0453 01/15/16 0426  WBC 10.4 9.9 12.0* 9.9 8.8  HGB 8.8* 8.2* 8.5* 8.2* 8.0*  HCT 29.3* 27.1* 28.3* 28.0* 27.0*  MCV 93.0 94.1 95.3 95.9 96.1  PLT 157 166 148* 152 140*    Cardiac Enzymes: No results for input(s): CKTOTAL, CKMB, CKMBINDEX, TROPONINI in the last 168 hours.  BNP: Invalid input(s): POCBNP  CBG:  Recent Labs Lab 01/11/16 2026  GLUCAP 114*     Microbiology: Results for orders placed or performed during the hospital encounter of 01/05/16  Surgical pcr screen     Status: Abnormal   Collection Time: 01/07/16 10:08 PM  Result Value Ref Range Status   MRSA, PCR NEGATIVE NEGATIVE Final   Staphylococcus aureus POSITIVE (A) NEGATIVE Final    Comment:        The Xpert SA Assay (FDA approved for NASAL specimens in patients over 5 years of age), is one component of a comprehensive surveillance program.  Test performance has been validated by [facility] for patients greater than or equal to 36 year old. It is not intended to diagnose infection nor to guide or monitor treatment.     Coagulation Studies:  Recent Labs  01/15/16 0426  LABPROT 12.8  INR 0.94    Urinalysis: No results for input(s): COLORURINE, LABSPEC, PHURINE, GLUCOSEU, HGBUR, BILIRUBINUR, KETONESUR, PROTEINUR, UROBILINOGEN, NITRITE, LEUKOCYTESUR in the last 72 hours.  Invalid input(s): APPERANCEUR    Imaging: No results found.   Medications:   . sodium chloride 10 mL/hr at 01/09/16 0537   . cefUROXime (ZINACEF)  IV  1.5 g Intravenous On Call to OR  . darbepoetin (ARANESP) injection - NON-DIALYSIS  100 mcg Subcutaneous Q Sat-1800  . febuxostat  40 mg Oral Daily  . heparin  5,000 Units Subcutaneous 3  times per day  . hydrALAZINE  50 mg Oral 3 times per day  . labetalol  200 mg Oral BID  . predniSONE  5 mg Oral Q breakfast  . sodium chloride flush  3 mL Intravenous Q12H   acetaminophen, hydrALAZINE, HYDROcodone-acetaminophen, ondansetron **OR** ondansetron (ZOFRAN) IV, oxyCODONE, promethazine  Assessment/ Plan:   Assessment/Plan: 39 year old black female with a prolonged course with FSG that's been refractory to treatment now presenting with worsening volume status in the setting of weaning steroids and also uremic symptoms that will necessitate initiation of dialysis   1.Renal- advanced CKD secondary to FSG and also volume overload that's  refractory to treatment. has R IJ HD catheter placement 4/17. K stable this am 4.2.  Cr 5.13.  Patient off the floor for permanent access/ AVF exploration today, Monday 4/24.  Will need HD once procedure completed. 2. Hypertension/volume - Continues to be volume overloaded. BP elevated 110-180/60-90s. She is on labetalol and scheduled hydralazine. Continue fluid removal as planned. 3. Pains in her legs- On uloric 4. Anemia - Hgb  8.0 getting ferric gluconate daliy 5. Bones- last PTH 132 as an OP- no meds needed     LOS: [place], DO 01/15/16 @7 :45 AM  I have seen and examined this patient and agree with plan as outlined by Dr. Lajuana Ripple.  Unfortunately she had arterial occlusion of AVF and there was concern for left hand ischemia with AVF and therefore this was ligated with plans for Right arm access per Dr. Donnetta Hutching. Governor Rooks Rondle Lohse,MD 01/15/2016 4:50 PM

## 2016-01-15 NOTE — Anesthesia Preprocedure Evaluation (Signed)
Anesthesia Evaluation  Patient identified by MRN, date of birth, ID band Patient awake    Reviewed: Allergy & Precautions, NPO status , Patient's Chart, lab work & pertinent test results  Airway Mallampati: II  TM Distance: >3 FB Neck ROM: Full    Dental  (+) Teeth Intact, Dental Advisory Given   Pulmonary former smoker,    breath sounds clear to auscultation       Cardiovascular hypertension,  Rhythm:Regular Rate:Normal     Neuro/Psych    GI/Hepatic   Endo/Other    Renal/GU      Musculoskeletal   Abdominal   Peds  Hematology   Anesthesia Other Findings   Reproductive/Obstetrics                             Anesthesia Physical Anesthesia Plan  ASA: III  Anesthesia Plan: General   Post-op Pain Management:    Induction: Intravenous  Airway Management Planned: LMA  Additional Equipment:   Intra-op Plan:   Post-operative Plan:   Informed Consent: I have reviewed the patients History and Physical, chart, labs and discussed the procedure including the risks, benefits and alternatives for the proposed anesthesia with the patient or authorized representative who has indicated his/her understanding and acceptance.   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 Hypertension Anemia Hct 27  Plan GA with LMA  Roberts Gaudy)        Anesthesia Quick Evaluation

## 2016-01-15 NOTE — Progress Notes (Signed)
Patient ID: Sandy [name], female   DOB: Dec 08, 1975, 40 y.o.   MRN: [id number] She currently in hemodialysis unit of with the dialysis via a right IJ catheter. I discussed the finding of occluded brachial artery and the her a thrombectomy and vein patch angioplasty of this. Explained decision not to use this as inflow for an AV graft with my concern potentially putting her hand at risk. Have recommended a right arm access. Will plan for this on Wednesday, 01/17/2015. Patient understands

## 2016-01-15 NOTE — Op Note (Signed)
    OPERATIVE REPORT  DATE OF SURGERY: 01/15/2016  PATIENT: Sandy [name], 40 y.o. female MRN: [id number]  DOB: 1975-11-08  PRE-OPERATIVE DIAGNOSIS: End-stage renal disease  POST-OPERATIVE DIAGNOSIS:  Same,Left brachial artery occlusion  PROCEDURE: Left brachial artery thrombectomy and vein patch angioplasty  SURGEON:  Curt Jews, M.D.  PHYSICIAN ASSISTANT: kim Trinh PA-C  ANESTHESIA:  Gen.  EBL: Minimal ml  Total I/O In: -  Out: 25 [Blood:25]  BLOOD ADMINISTERED: None  DRAINS: None  SPECIMEN: None  COUNTS CORRECT:  YES  PLAN OF CARE: PACU   PATIENT DISPOSITION:  PACU - hemodynamically stable  PROCEDURE DETAILS: Patient is end-stage renal disease with currently being dialyzed via right IJ catheter. She had a leftupper arm cephalic vein fistula created severalweeks ago. This had failed to mature. She was taken to the operating room today for planned exploration and probable left upper arm AV Gore-Tex graft. The patient was placed in supine position. After left arm was prepped and draped in usual sterile fashion. Incision was made through the old antecubital incision. This was carried down to the prior brachial artery to cephalic vein anastomosis. There was no pulse in the brachial artery. The artery was exposed further proximally and distally and there was no pulse even above the old anastomosis. The vein was excised and was occluded at the venous anastomosis. The artery was opened longitudinally through the old vein anastomosis. There was organized pain and appearing chronic thrombus in the artery. The artery was opened further proximally and distally with Potts scissors. A 3 Fogarty catheter was used toremove this chronic thrombus distally initially. 2-1/2 and then a 3 dilator passed through this. The great and chronic thrombus was removed there was good backbleeding. This was reoccluded. The patient was given 5000 units intravenous heparin. Next the Fogarty was passed proximally  and the brachial artery. There was adherent chronic thrombus here as well. This was removedbluntly with mosquito hemostat. There was good inflow. A 2-1/2 and 3 dilator passed with some resistance in the proximal artery. The cephalic vein of further proximal to the old anastomosis was exposed was ligated further proximally in the segment of the cephalic vein was removed. This was spatulated and uses a long patch over the brachial artery with a 6-0 Prolene suture as a vein patch angioplasty. Clamps removed from the artery and there wasgood Doppler flow at the wrist and the radial and ulnar artery. There was some flow even with the brachial artery reoccluded at this level of the repair. Felt inappropriate to use this as an inflow due to the occlusion of the artery was concerned about putting the left arm at risk for ischemia withusing this forAV access. The wound was irrigated with saline. The protamine was not reversed. The wound was closed with 3-0 Vicryl the subcutaneous and subcuticular tissue. Benzoin insertion for applied the patient was taken to the recovery room. She did have a palpable radial pulse upon recovering. Plan will be for probable right arm access.   Curt Jews, M.D. 01/15/2016 1:16 PM

## 2016-01-15 NOTE — Progress Notes (Signed)
Came to see patient this AM, in surgery and still in surgery at this time.  Will try back later tonight Eulogio Bear DO

## 2016-01-15 NOTE — Anesthesia Postprocedure Evaluation (Signed)
Anesthesia Post Note  Patient: Sandy [name]  Procedure(s) Performed: Procedure(s) (LRB): Embolectomy of Left Arm Fistula with revision of Brachiocephalic fistula and vein patch angioplasty (Left)  Patient location during evaluation: PACU Anesthesia Type: General Level of consciousness: awake, awake and alert and oriented Pain management: pain level controlled Vital Signs Assessment: post-procedure vital signs reviewed and stable Respiratory status: spontaneous breathing, nonlabored ventilation and respiratory function stable Cardiovascular status: blood pressure returned to baseline Anesthetic complications: no    Last Vitals:  Filed Vitals:   01/15/16 1625 01/15/16 1700  BP: 118/80 116/71  Pulse: 74 88  Temp: 36.4 C   Resp: 16 16    Last Pain:  Filed Vitals:   01/15/16 1702  PainSc: 10-Worst pain ever                 Cheyane Ayon COKER

## 2016-01-15 NOTE — H&P (View-Only) (Signed)
Patient ID: Sandy [name], female   DOB: [birth year], 40 y.o.   MRN: [id number] Nausea improved. Reports this is worse with dialysis. No difficulty with hemodialysis catheter. Plan left AV fistula exploration 1 Monday and the OR. Doubt the fistula will be salvageable and will place a left upper arm AV graft if the fistula does not look like an option. Discussed again at length with the patient today understands. Will not see her over the weekend. We'll plan for surgery on Monday

## 2016-01-15 NOTE — Interval H&P Note (Signed)
History and Physical Interval Note:  01/15/2016 10:19 AM  Sandy [name]  has presented today for surgery, with the diagnosis of Stage IV Chronic Kidney Disease N18.4  The various methods of treatment have been discussed with the patient and family. After consideration of risks, benefits and other options for treatment, the patient has consented to  Procedure(s): INSERTION OF ARTERIOVENOUS (AV) GORE-TEX GRAFT ARM (Left) as a surgical intervention .  The patient's history has been reviewed, patient examined, no change in status, stable for surgery.  I have reviewed the patient's chart and labs.  Questions were answered to the patient's satisfaction.     Curt Jews

## 2016-01-16 ENCOUNTER — Encounter (HOSPITAL_COMMUNITY): Payer: Self-pay | Admitting: Vascular Surgery

## 2016-01-16 LAB — RENAL FUNCTION PANEL
ALBUMIN: 1.4 g/dL — AB (ref 3.5–5.0)
Anion gap: 8 (ref 5–15)
BUN: 16 mg/dL (ref 6–20)
CO2: 29 mmol/L (ref 22–32)
Calcium: 7.7 mg/dL — ABNORMAL LOW (ref 8.9–10.3)
Chloride: 101 mmol/L (ref 101–111)
Creatinine, Ser: 3.55 mg/dL — ABNORMAL HIGH (ref 0.44–1.00)
GFR calc Af Amer: 18 mL/min — ABNORMAL LOW (ref >60)
GFR calc non Af Amer: 15 mL/min — ABNORMAL LOW (ref >60)
GLUCOSE: 86 mg/dL (ref 65–99)
PHOSPHORUS: 4.9 mg/dL — AB (ref 2.5–4.6)
POTASSIUM: 3.6 mmol/L (ref 3.5–5.1)
SODIUM: 138 mmol/L (ref 135–145)

## 2016-01-16 LAB — CBC
HEMATOCRIT: 26.8 % — AB (ref 36.0–46.0)
Hemoglobin: 7.8 g/dL — ABNORMAL LOW (ref 12.0–15.0)
MCH: 28.2 pg (ref 26.0–34.0)
MCHC: 29.1 g/dL — AB (ref 30.0–36.0)
MCV: 96.8 fL (ref 78.0–100.0)
Platelets: 144 10*3/uL — ABNORMAL LOW (ref 150–400)
RBC: 2.77 MIL/uL — ABNORMAL LOW (ref 3.87–5.11)
RDW: 17.6 % — AB (ref 11.5–15.5)
WBC: 8.6 10*3/uL (ref 4.0–10.5)

## 2016-01-16 MED ORDER — DARBEPOETIN ALFA 100 MCG/0.5ML IJ SOSY: 100.0000 ug | PREFILLED_SYRINGE | INTRAMUSCULAR | Status: DC

## 2016-01-16 MED ORDER — DEXTROSE 5 % IV SOLN
1.5000 g | INTRAVENOUS | Status: AC
  Administered 2016-01-17: 1.5 g via INTRAVENOUS
  Filled 2016-01-16 (×3): qty 1.5

## 2016-01-16 NOTE — Progress Notes (Signed)
Patient to have AVF/AVG placed in right arm tomorrow. PIV access currently in right arm. Verified with vascular PA and Dr. Marval Regal that it was ok to place a PIV in patient's left arm, despite having a recent thrombectomy. Stated it would need to be placed in left forearm or lower, nothing in the LAC. Will inform patient.  Joellen Jersey, RN.

## 2016-01-16 NOTE — Progress Notes (Signed)
PROGRESS NOTE    Sandy [name]  [id number] DOB: Jan 26, 1976 DOA: 01/05/2016 PCP: No PCP Per Patient   Outpatient Specialists:  Dr. Donnetta Hutching   Brief Narrative:  Patient admitted with anasarca, progression of CKD. She was started on HD. Her first HD was 4-17. 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 -plan for right arm access on Wednesday  Metabolic acidosis:  sodium bicarb to TID.    Atypical chest pain --troponin, first negative --Echo EF 60 % --EKG; peak T wave.   Anemia of chronic disease (renal failure) -transfuse for <7 -defer to renal  Accelerated HTN --Continue home medications --IV hydralazine prn --Hydralazine, labetalol.     DVT prophylaxis:  SQ Heparin  Code Status: Full   Family Communication: No family at bedside  Disposition Plan:     Consultants:   Nephrology  vascular   Procedures:        Subjective: Eating well, no further nausea  Objective: Filed Vitals:   01/15/16 2057 01/16/16 0414 01/16/16 0427 01/16/16 0833  BP: 120/83 125/69 167/63 115/66  Pulse: 79 79 93 91  Temp: 98.6 F (37 C) 98.7 F (37.1 C) 97.8 F (36.6 C) 98.8 F (37.1 C)  TempSrc: Oral Oral Oral Oral  Resp: 18 19 18 18   Height:      Weight:      SpO2: 98% 100%  100%    Intake/Output Summary (Last 24 hours) at 01/16/16 1119 Last data filed at 01/16/16 1000  Gross per 24 hour  Intake    860 ml  Output   3525 ml  Net  -2665 ml    Filed Weights   01/12/16 1014 01/15/16 1625 01/15/16 2032  Weight: 97.5 kg (214 lb 15.2 oz) 99.6 kg (219 lb 9.3 oz) 96.4 kg (212 lb 8.4 oz)    Examination:  General exam: NAD    Data Reviewed: I have personally reviewed following labs and imaging studies  CBC:  Recent Labs Lab 01/12/16 0515 01/13/16 0257 01/14/16 0453 01/15/16 0426 01/16/16 0515  WBC 9.9 12.0* 9.9 8.8 8.6  HGB 8.2* 8.5* 8.2* 8.0* 7.8*  HCT 27.1* 28.3* 28.0* 27.0* 26.8*  MCV 94.1 95.3 95.9 96.1 96.8  PLT 166 148* 152 140* [id number]*   Basic Metabolic Panel:  Recent Labs Lab 01/12/16 0515 01/13/16 0257 01/14/16 0453 01/15/16 0426 01/16/16 0515  NA 138 136 137 139 138  K 3.6 4.6 4.2 4.2 3.6  CL 102 100* 100* 100* 101  CO2 25 26 26 26 29   GLUCOSE 82 79 74 77 86  BUN 36* 21* 31* 40* 16  CREATININE 4.21* 3.80* 4.77* 5.13* 3.55*  CALCIUM 7.9* 7.8* 8.0* 8.1* 7.7*  PHOS 6.2* 4.5 6.1* 6.8* 4.9*   GFR: Estimated Creatinine Clearance: 25.4 mL/min (by C-G formula based on Cr of 3.55). Liver Function Tests:  Recent Labs Lab 01/12/16 0515 01/13/16 0257 01/14/16 0453 01/15/16 0426 01/16/16 0515  ALBUMIN 1.4* 1.3* 1.5* 1.4* 1.4*   No results for input(s):  LIPASE, AMYLASE in the last 168 hours. No results for input(s): AMMONIA in the last 168 hours. Coagulation Profile:  Recent Labs Lab 01/15/16 0426  INR 0.94   Cardiac Enzymes: No results for input(s): CKTOTAL, CKMB, CKMBINDEX, TROPONINI in the last 168 hours. BNP (last 3 results) No results for input(s): PROBNP in the last 8760 hours. HbA1C: No results for input(s): HGBA1C in the last 72 hours. CBG:  Recent Labs Lab 01/11/16 2026  GLUCAP 114*   Lipid Profile: No results for input(s): CHOL, HDL, LDLCALC, TRIG, CHOLHDL, LDLDIRECT in the last 72 hours. Thyroid Function Tests: No results for input(s): TSH, T4TOTAL, FREET4, T3FREE, THYROIDAB in the last 72 hours. Anemia Panel: No results for input(s): VITAMINB12, FOLATE, FERRITIN, TIBC,  IRON, RETICCTPCT in the last 72 hours. Urine analysis:    Component Value Date/Time   COLORURINE YELLOW 10/14/2015 1604   APPEARANCEUR CLOUDY* 10/14/2015 1604   LABSPEC 1.027 10/14/2015 1604   PHURINE 5.5 10/14/2015 [place] 10/14/2015 1604   HGBUR NEGATIVE 10/14/2015 [place] 10/14/2015 [place] 10/14/2015 1604   PROTEINUR >300* 10/14/2015 1604   NITRITE NEGATIVE 10/14/2015 1604   LEUKOCYTESUR NEGATIVE 10/14/2015 1604     Recent Results (from the past 240 hour(s))  Surgical pcr screen     Status: Abnormal   Collection Time: 01/07/16 10:08 PM  Result Value Ref Range Status   MRSA, PCR NEGATIVE NEGATIVE Final   Staphylococcus aureus POSITIVE (A) NEGATIVE Final    Comment:        The Xpert SA Assay (FDA approved for NASAL specimens in patients over 38 years of age), is one component of a comprehensive surveillance program.  Test performance has been validated by [facility] for patients greater than or equal to 76 year old. It is not intended to diagnose infection nor to guide or monitor treatment.       Anti-infectives    Start     Dose/Rate Route Frequency Ordered Stop   01/15/16 0600  cefUROXime (ZINACEF) 1.5 g in dextrose 5 % 50 mL IVPB     1.5 g 100 mL/hr over 30 Minutes Intravenous On call to O.R. 01/14/16 1932 01/15/16 1125   01/15/16 0600  cefUROXime (ZINACEF) 1.5 g in dextrose 5 % 50 mL IVPB  Status:  Discontinued     1.5 g 100 mL/hr over 30 Minutes Intravenous On call to O.R. 01/14/16 1932 01/14/16 1934   01/08/16 1200  cefUROXime (ZINACEF) 1.5 g in dextrose 5 % 50 mL IVPB     1.5 g 100 mL/hr over 30 Minutes Intravenous To Short Stay 01/07/16 1958 01/08/16 1245   01/08/16 0000  ceFAZolin (ANCEF) IVPB 1 g/50 mL premix  Status:  Discontinued    Comments:  Send with pt to OR   1 g 100 mL/hr over 30 Minutes Intravenous On call 01/07/16 1958 01/07/16 2010       Radiology Studies: No results  found.      Scheduled Meds: . darbepoetin (ARANESP) injection - NON-DIALYSIS  100 mcg Subcutaneous Q Sat-1800  . febuxostat  40 mg Oral Daily  . heparin  5,000 Units Subcutaneous 3 times per day  . hydrALAZINE  50 mg Oral 3 times per day  . labetalol  200 mg Oral BID  . predniSONE  5 mg Oral Q breakfast  . sodium chloride flush  3 mL Intravenous Q12H   Continuous Infusions: . sodium chloride 10 mL/hr at 01/15/16 0930  LOS: 11 days    Time spent: 15 min    [place], DO [facility] Pager [phone]  If 7PM-7AM, please contact night-coverage [email or web address] Password [facility] 01/16/2016, 11:19 AM

## 2016-01-16 NOTE — Progress Notes (Signed)
[facility] ROUNDING NOTE   Subjective:   Interval History:  Dialysis dependent nephrotic syndrome.  No UOP/24 hours. 3.5L off in HD yesterday.  Reports some discomfort in UE where fistula was interrogated.  Otherwise no concerns this am.   Objective:  Vital signs in last 24 hours:  Temp:  [97.5 F (36.4 C)-98.7 F (37.1 C)] 97.8 F (36.6 C) (04/25 0427) Pulse Rate:  [74-93] 93 (04/25 0427) Resp:  [9-19] 18 (04/25 0427) BP: (107-167)/(62-93) 167/63 mmHg (04/25 0427) SpO2:  [98 %-100 %] 100 % (04/25 0414) Weight:  [212 lb 8.4 oz (96.4 kg)-219 lb 9.3 oz (99.6 kg)] 212 lb 8.4 oz (96.4 kg) (04/24 2032)  Weight change:  Filed Weights   01/12/16 1014 01/15/16 1625 01/15/16 2032  Weight: 214 lb 15.2 oz (97.5 kg) 219 lb 9.3 oz (99.6 kg) 212 lb 8.4 oz (96.4 kg)    Intake/Output: I/O last 3 completed shifts: In: 740 [P.O.:540; I.V.:200] Out: 3525 [Other:3500; Blood:25]   Intake/Output this shift:     Physical exam: Gen: awake, alert, sitting in bed, NAD HEENT: MMM Cardio: RRR, no murmurs Pulm: CTAB, normal WOB on room air Ext: compression hose in place, continues to have LE edema.  Also notable sacral edema 0000000  Basic Metabolic Panel:  Recent Labs Lab 01/12/16 0515 01/13/16 0257 01/14/16 0453 01/15/16 0426 01/16/16 0515  NA 138 136 137 139 138  K 3.6 4.6 4.2 4.2 3.6  CL 102 100* 100* 100* 101  CO2 25 26 26 26 29   GLUCOSE 82 79 74 77 86  BUN 36* 21* 31* 40* 16  CREATININE 4.21* 3.80* 4.77* 5.13* 3.55*  CALCIUM 7.9* 7.8* 8.0* 8.1* 7.7*  PHOS 6.2* 4.5 6.1* 6.8* 4.9*    Liver Function Tests:  Recent Labs Lab 01/12/16 0515 01/13/16 0257 01/14/16 0453 01/15/16 0426 01/16/16 0515  ALBUMIN 1.4* 1.3* 1.5* 1.4* 1.4*   No results for input(s): LIPASE, AMYLASE in the last 168 hours. No results for input(s): AMMONIA in the last 168 hours.  CBC:  Recent Labs Lab 01/12/16 0515 01/13/16 0257 01/14/16 0453 01/15/16 0426 01/16/16 0515  WBC 9.9  12.0* 9.9 8.8 8.6  HGB 8.2* 8.5* 8.2* 8.0* 7.8*  HCT 27.1* 28.3* 28.0* 27.0* 26.8*  MCV 94.1 95.3 95.9 96.1 96.8  PLT 166 148* 152 140* 144*    Cardiac Enzymes: No results for input(s): CKTOTAL, CKMB, CKMBINDEX, TROPONINI in the last 168 hours.  BNP: Invalid input(s): POCBNP  CBG:  Recent Labs Lab 01/11/16 2026  GLUCAP 114*    Microbiology: Results for orders placed or performed during the hospital encounter of 01/05/16  Surgical pcr screen     Status: Abnormal   Collection Time: 01/07/16 10:08 PM  Result Value Ref Range Status   MRSA, PCR NEGATIVE NEGATIVE Final   Staphylococcus aureus POSITIVE (A) NEGATIVE Final    Comment:        The Xpert SA Assay (FDA approved for NASAL specimens in patients over 53 years of age), is one component of a comprehensive surveillance program.  Test performance has been validated by [facility] for patients greater than or equal to 39 year old. It is not intended to diagnose infection nor to guide or monitor treatment.     Coagulation Studies:  Recent Labs  01/15/16 0426  LABPROT 12.8  INR 0.94    Urinalysis: No results for input(s): COLORURINE, LABSPEC, PHURINE, GLUCOSEU, HGBUR, BILIRUBINUR, KETONESUR, PROTEINUR, UROBILINOGEN, NITRITE, LEUKOCYTESUR in the last 72 hours.  Invalid input(s): APPERANCEUR  Imaging: No results found.   Medications:   . sodium chloride 10 mL/hr at 01/15/16 0930   . darbepoetin (ARANESP) injection - NON-DIALYSIS  100 mcg Subcutaneous Q Sat-1800  . febuxostat  40 mg Oral Daily  . heparin  5,000 Units Subcutaneous 3 times per day  . hydrALAZINE  50 mg Oral 3 times per day  . labetalol  200 mg Oral BID  . predniSONE  5 mg Oral Q breakfast  . 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 has R IJ HD catheter placement 4/17. s/p HD yesterday. K stable this am 3.6.  Cr 3.55.  Had arterial occlusion of AVF.  Vascular plans for right arm access on Wed 4/26.  Will make HD plans around access placement. 2. Hypertension/volume - Continues to be volume overloaded.  Sacral edema and LE edema on exam. BP improving 100-160/60-80s. She is on labetalol and scheduled hydralazine. Continue fluid removal. 3. Pains in her legs- On uloric 4. Anemia - Hgb  7.8 getting ferric gluconate daliy 5. Bones- last PTH 132 as an OP- no meds needed     LOS: [place], DO 01/16/16 @7 :46 AM   I have seen and examined this patient and agree with plan as outlined by dr. Lajuana Ripple.  She did well with HD yesterday.  Will plan for another HD tomorrow after placement of her right AVF/AVG per [facility]VS.  She is set up for outpatient HD MWF at [facility] so will continue with her outpatient schedule. [place] John A Deleon Passe,MD 01/16/2016 1:32 PM

## 2016-01-17 ENCOUNTER — Inpatient Hospital Stay (HOSPITAL_COMMUNITY): Payer: Medicaid Other | Admitting: Anesthesiology

## 2016-01-17 ENCOUNTER — Encounter (HOSPITAL_COMMUNITY)
Admission: EM | Disposition: A | Discharge status: Home / Self Care | Payer: Self-pay | Source: Home / Self Care | Attending: Internal Medicine

## 2016-01-17 ENCOUNTER — Other Ambulatory Visit: Payer: Self-pay | Admitting: *Deleted

## 2016-01-17 ENCOUNTER — Encounter (HOSPITAL_COMMUNITY): Payer: Self-pay | Admitting: Anesthesiology

## 2016-01-17 DIAGNOSIS — N186 End stage renal disease: Secondary | ICD-10-CM

## 2016-01-17 DIAGNOSIS — N184 Chronic kidney disease, stage 4 (severe): Secondary | ICD-10-CM

## 2016-01-17 DIAGNOSIS — Z4931 Encounter for adequacy testing for hemodialysis: Secondary | ICD-10-CM

## 2016-01-17 DIAGNOSIS — I742 Embolism and thrombosis of arteries of the upper extremities: Secondary | ICD-10-CM

## 2016-01-17 DIAGNOSIS — E8770 Fluid overload, unspecified: Secondary | ICD-10-CM

## 2016-01-17 DIAGNOSIS — N049 Nephrotic syndrome with unspecified morphologic changes: Secondary | ICD-10-CM

## 2016-01-17 DIAGNOSIS — R0789 Other chest pain: Secondary | ICD-10-CM

## 2016-01-17 HISTORY — PX: AV FISTULA PLACEMENT: SHX1204

## 2016-01-17 LAB — CBC
HEMATOCRIT: 26.5 % — AB (ref 36.0–46.0)
HEMOGLOBIN: 7.7 g/dL — AB (ref 12.0–15.0)
MCH: 28.1 pg (ref 26.0–34.0)
MCHC: 29.1 g/dL — ABNORMAL LOW (ref 30.0–36.0)
MCV: 96.7 fL (ref 78.0–100.0)
PLATELETS: 168 10*3/uL (ref 150–400)
RBC: 2.74 MIL/uL — AB (ref 3.87–5.11)
RDW: 17.6 % — ABNORMAL HIGH (ref 11.5–15.5)
WBC: 8.3 10*3/uL (ref 4.0–10.5)

## 2016-01-17 LAB — POCT I-STAT 4, (NA,K, GLUC, HGB,HCT)
GLUCOSE: 87 mg/dL (ref 65–99)
HCT: 24 % — ABNORMAL LOW (ref 36.0–46.0)
HEMOGLOBIN: 8.2 g/dL — AB (ref 12.0–15.0)
POTASSIUM: 3.8 mmol/L (ref 3.5–5.1)
SODIUM: 137 mmol/L (ref 135–145)

## 2016-01-17 LAB — BASIC METABOLIC PANEL
ANION GAP: 10 (ref 5–15)
Anion gap: 12 (ref 5–15)
BUN: 32 mg/dL — AB (ref 6–20)
BUN: 30 mg/dL — AB (ref 6–20)
CHLORIDE: 105 mmol/L (ref 101–111)
CHLORIDE: 102 mmol/L (ref 101–111)
CO2: 22 mmol/L (ref 22–32)
CO2: 25 mmol/L (ref 22–32)
Calcium: 8.4 mg/dL — ABNORMAL LOW (ref 8.9–10.3)
Calcium: 8.2 mg/dL — ABNORMAL LOW (ref 8.9–10.3)
Creatinine, Ser: 5.3 mg/dL — ABNORMAL HIGH (ref 0.44–1.00)
Creatinine, Ser: 5.6 mg/dL — ABNORMAL HIGH (ref 0.44–1.00)
GFR calc Af Amer: 11 mL/min — ABNORMAL LOW (ref >60)
GFR calc Af Amer: 10 mL/min — ABNORMAL LOW (ref >60)
GFR calc non Af Amer: 9 mL/min — ABNORMAL LOW (ref >60)
GFR, EST NON AFRICAN AMERICAN: 9 mL/min — AB (ref >60)
GLUCOSE: 79 mg/dL (ref 65–99)
GLUCOSE: 86 mg/dL (ref 65–99)
POTASSIUM: 4.5 mmol/L (ref 3.5–5.1)
POTASSIUM: 3.8 mmol/L (ref 3.5–5.1)
Sodium: 139 mmol/L (ref 135–145)
Sodium: 137 mmol/L (ref 135–145)

## 2016-01-17 LAB — RENAL FUNCTION PANEL
ANION GAP: 10 (ref 5–15)
Albumin: 1.4 g/dL — ABNORMAL LOW (ref 3.5–5.0)
Albumin: 1.4 g/dL — ABNORMAL LOW (ref 3.5–5.0)
Anion gap: 11 (ref 5–15)
BUN: 32 mg/dL — ABNORMAL HIGH (ref 6–20)
BUN: 30 mg/dL — ABNORMAL HIGH (ref 6–20)
CHLORIDE: 104 mmol/L (ref 101–111)
CHLORIDE: 102 mmol/L (ref 101–111)
CO2: 24 mmol/L (ref 22–32)
CO2: 25 mmol/L (ref 22–32)
CREATININE: 5.6 mg/dL — AB (ref 0.44–1.00)
Calcium: 8.3 mg/dL — ABNORMAL LOW (ref 8.9–10.3)
Calcium: 8.2 mg/dL — ABNORMAL LOW (ref 8.9–10.3)
Creatinine, Ser: 5.21 mg/dL — ABNORMAL HIGH (ref 0.44–1.00)
GFR calc Af Amer: 11 mL/min — ABNORMAL LOW (ref >60)
GFR calc non Af Amer: 10 mL/min — ABNORMAL LOW (ref >60)
GFR, EST AFRICAN AMERICAN: 10 mL/min — AB (ref >60)
GFR, EST NON AFRICAN AMERICAN: 9 mL/min — AB (ref >60)
GLUCOSE: 80 mg/dL (ref 65–99)
Glucose, Bld: 86 mg/dL (ref 65–99)
POTASSIUM: 4.2 mmol/L (ref 3.5–5.1)
Phosphorus: 7.1 mg/dL — ABNORMAL HIGH (ref 2.5–4.6)
Phosphorus: 7 mg/dL — ABNORMAL HIGH (ref 2.5–4.6)
Potassium: 3.8 mmol/L (ref 3.5–5.1)
Sodium: 139 mmol/L (ref 135–145)
Sodium: 137 mmol/L (ref 135–145)

## 2016-01-17 LAB — PROTIME-INR
INR: 0.89 (ref 0.00–1.49)
Prothrombin Time: 12.2 seconds (ref 11.6–15.2)

## 2016-01-17 LAB — I-STAT BETA HCG BLOOD, ED (NOT ORDERABLE): I-stat hCG, quantitative: <5 m[IU]/mL (ref <5)

## 2016-01-17 SURGERY — ARTERIOVENOUS (AV) FISTULA CREATION
Anesthesia: General | Site: Arm Lower | Laterality: Right

## 2016-01-17 MED ORDER — PROPOFOL 10 MG/ML IV BOLUS
INTRAVENOUS | Status: AC
  Filled 2016-01-17: qty 40

## 2016-01-17 MED ORDER — SODIUM CHLORIDE 0.9 % IV SOLN
INTRAVENOUS | Status: DC | PRN
  Administered 2016-01-17: 500 mL

## 2016-01-17 MED ORDER — HEPARIN SODIUM (PORCINE) 1000 UNIT/ML DIALYSIS: 20.0000 [IU]/kg | INTRAMUSCULAR | Status: DC | PRN

## 2016-01-17 MED ORDER — 0.9 % SODIUM CHLORIDE (POUR BTL) OPTIME
TOPICAL | Status: DC | PRN
  Administered 2016-01-17: 1000 mL

## 2016-01-17 MED ORDER — OXYCODONE HCL 5 MG PO TABS
5.0000 mg | ORAL_TABLET | Freq: Once | ORAL | Status: AC | PRN
  Administered 2016-01-17: 5 mg via ORAL

## 2016-01-17 MED ORDER — OXYCODONE HCL 5 MG PO TABS
5.0000 mg | ORAL_TABLET | ORAL | Status: DC | PRN
  Administered 2016-01-17: 10 mg via ORAL
  Administered 2016-01-17: 5 mg via ORAL
  Administered 2016-01-17 – 2016-01-22 (×25): 10 mg via ORAL
  Filled 2016-01-17 (×9): qty 2
  Filled 2016-01-17: qty 1
  Filled 2016-01-17 (×12): qty 2
  Filled 2016-01-17: qty 1
  Filled 2016-01-17 (×3): qty 2

## 2016-01-17 MED ORDER — MIDAZOLAM HCL 2 MG/2ML IJ SOLN
INTRAMUSCULAR | Status: AC
  Filled 2016-01-17: qty 2

## 2016-01-17 MED ORDER — ONDANSETRON HCL 4 MG/2ML IJ SOLN
INTRAMUSCULAR | Status: DC | PRN
  Administered 2016-01-17: 4 mg via INTRAVENOUS

## 2016-01-17 MED ORDER — EPHEDRINE SULFATE 50 MG/ML IJ SOLN
INTRAMUSCULAR | Status: DC | PRN
  Administered 2016-01-17: 10 mg via INTRAVENOUS

## 2016-01-17 MED ORDER — FENTANYL CITRATE (PF) 250 MCG/5ML IJ SOLN
INTRAMUSCULAR | Status: AC
  Filled 2016-01-17: qty 5

## 2016-01-17 MED ORDER — PROPOFOL 10 MG/ML IV BOLUS
INTRAVENOUS | Status: DC | PRN
  Administered 2016-01-17: 20 mg via INTRAVENOUS
  Administered 2016-01-17: 180 mg via INTRAVENOUS

## 2016-01-17 MED ORDER — ONDANSETRON HCL 4 MG/2ML IJ SOLN: 4.0000 mg | Freq: Four times a day (QID) | INTRAMUSCULAR | Status: DC | PRN

## 2016-01-17 MED ORDER — FENTANYL CITRATE (PF) 100 MCG/2ML IJ SOLN
INTRAMUSCULAR | Status: DC | PRN
  Administered 2016-01-17 (×3): 50 ug via INTRAVENOUS

## 2016-01-17 MED ORDER — FENTANYL CITRATE (PF) 100 MCG/2ML IJ SOLN
25.0000 ug | INTRAMUSCULAR | Status: DC | PRN
  Administered 2016-01-17: 50 ug via INTRAVENOUS
  Administered 2016-01-17: 25 ug via INTRAVENOUS

## 2016-01-17 MED ORDER — MIDAZOLAM HCL 5 MG/5ML IJ SOLN
INTRAMUSCULAR | Status: DC | PRN
  Administered 2016-01-17: 2 mg via INTRAVENOUS

## 2016-01-17 MED ORDER — LIDOCAINE-EPINEPHRINE 0.5 %-1:200000 IJ SOLN
INTRAMUSCULAR | Status: AC
  Filled 2016-01-17: qty 1

## 2016-01-17 MED ORDER — PREDNISONE 5 MG PO TABS
2.5000 mg | ORAL_TABLET | Freq: Every day | ORAL | Status: DC
  Administered 2016-01-18 – 2016-01-22 (×5): 2.5 mg via ORAL
  Filled 2016-01-17 (×5): qty 1

## 2016-01-17 MED ORDER — OXYCODONE HCL 5 MG PO TABS
ORAL_TABLET | ORAL | Status: AC
  Filled 2016-01-17: qty 1

## 2016-01-17 MED ORDER — FENTANYL CITRATE (PF) 100 MCG/2ML IJ SOLN
INTRAMUSCULAR | Status: AC
  Filled 2016-01-17: qty 2

## 2016-01-17 MED ORDER — SODIUM CHLORIDE 0.9 % IV SOLN: INTRAVENOUS | Status: DC

## 2016-01-17 MED ORDER — OXYCODONE HCL 5 MG/5ML PO SOLN: 5.0000 mg | Freq: Once | ORAL | Status: AC | PRN

## 2016-01-17 SURGICAL SUPPLY — 33 items
ARMBAND PINK RESTRICT EXTREMIT (MISCELLANEOUS) ×2 IMPLANT
BENZOIN TINCTURE PRP APPL 2/3 (GAUZE/BANDAGES/DRESSINGS) ×2 IMPLANT
CANISTER SUCTION 2500CC (MISCELLANEOUS) ×2 IMPLANT
CANNULA VESSEL 3MM 2 BLNT TIP (CANNULA) IMPLANT
CLIP LIGATING EXTRA MED SLVR (CLIP) ×2 IMPLANT
CLIP LIGATING EXTRA SM BLUE (MISCELLANEOUS) ×2 IMPLANT
COVER PROBE W GEL 5X96 (DRAPES) ×2 IMPLANT
DECANTER SPIKE VIAL GLASS SM (MISCELLANEOUS) ×2 IMPLANT
ELECT REM PT RETURN 9FT ADLT (ELECTROSURGICAL) ×2
ELECTRODE REM PT RTRN 9FT ADLT (ELECTROSURGICAL) ×1 IMPLANT
GAUZE SPONGE 4X4 12PLY STRL (GAUZE/BANDAGES/DRESSINGS) ×2 IMPLANT
GEL ULTRASOUND 20GR AQUASONIC (MISCELLANEOUS) IMPLANT
GLOVE BIO SURGEON STRL SZ 6.5 (GLOVE) ×4 IMPLANT
GLOVE BIOGEL PI IND STRL 6.5 (GLOVE) ×2 IMPLANT
GLOVE BIOGEL PI INDICATOR 6.5 (GLOVE) ×2
GLOVE SS BIOGEL STRL SZ 7.5 (GLOVE) ×1 IMPLANT
GLOVE SUPERSENSE BIOGEL SZ 7.5 (GLOVE) ×1
GOWN BRE IMP SLV AUR XL STRL (GOWN DISPOSABLE) ×4 IMPLANT
GOWN STRL REUS W/ TWL LRG LVL3 (GOWN DISPOSABLE) ×3 IMPLANT
GOWN STRL REUS W/TWL LRG LVL3 (GOWN DISPOSABLE) ×3
KIT BASIN OR (CUSTOM PROCEDURE TRAY) ×2 IMPLANT
KIT ROOM TURNOVER OR (KITS) ×2 IMPLANT
NS IRRIG 1000ML POUR BTL (IV SOLUTION) ×2 IMPLANT
PACK CV ACCESS (CUSTOM PROCEDURE TRAY) ×2 IMPLANT
PAD ARMBOARD 7.5X6 YLW CONV (MISCELLANEOUS) ×4 IMPLANT
SPONGE GAUZE 4X4 12PLY STER LF (GAUZE/BANDAGES/DRESSINGS) ×2 IMPLANT
STRIP CLOSURE SKIN 1/2X4 (GAUZE/BANDAGES/DRESSINGS) ×2 IMPLANT
SUT PROLENE 6 0 CC (SUTURE) ×2 IMPLANT
SUT VIC AB 3-0 SH 27 (SUTURE) ×1
SUT VIC AB 3-0 SH 27X BRD (SUTURE) ×1 IMPLANT
TAPE CLOTH SURG 4X10 WHT LF (GAUZE/BANDAGES/DRESSINGS) ×2 IMPLANT
UNDERPAD 30X30 INCONTINENT (UNDERPADS AND DIAPERS) ×2 IMPLANT
WATER STERILE IRR 1000ML POUR (IV SOLUTION) ×2 IMPLANT

## 2016-01-17 NOTE — Op Note (Signed)
    OPERATIVE REPORT  DATE OF SURGERY: 01/17/2016  PATIENT: Sandy [name], 40 y.o. female MRN: [id number]  DOB: 1976-06-02  PRE-OPERATIVE DIAGNOSIS: End-stage renal disease  POST-OPERATIVE DIAGNOSIS:  Same  PROCEDURE: Right upper arm brachiocephalic fistula  SURGEON:  Curt Jews, M.D.  PHYSICIAN ASSISTANT: Pervis Hocking [facility]  ANESTHESIA:  Gen.  EBL: Minimal ml  Total I/O In: 610 [P.O.:60; I.V.:550] Out: 115 [Urine:100; Blood:15]  BLOOD ADMINISTERED: None  DRAINS: None  SPECIMEN: None  COUNTS CORRECT:  YES  PLAN OF CARE: PACU   PATIENT DISPOSITION:  PACU - hemodynamically stable  PROCEDURE DETAILS: Patient was taken to the operative room placed supine position where the area of the right arm prepped draped sterile fashion. Incision was made at the antecubital space after SonoSite revealed a large caliber cephalic vein in the right antecubital space. The vein was exposed in its curvature branches were ligated with 301 4-0 silk ties and divided. The vein was mobilized proximally and distally. The vein was ligated distally and divided and brought into approximation with the brachial artery. Brachial artery was exposed the same incision. There was a high bifurcation the brachial artery. The more superficial of the brachial artery at the antecubital space was somewhat smaller than the deep artery at this level. The vein had a better course into the more superficial. A curved Serafin clamp was used to occlude the brachial artery proximal and distal this. The artery was opened with an 11 blade incision longitudinally with Potts scissors. A 2 dilator passed easily through the artery. The vein was brought into approximation with the artery. The vein was spatulated and sewn end-to-side to the artery with a running 6-0 Prolene suture. Clamps removed and excellent thrill was noted. The wounds were closed with 3-0 Vicryl in the subcutaneous and subcuticular tissue. Benzoin Steri-Strips were applied  patient transferred to the recovery room stable condition   Curt Jews, M.D. 01/17/2016 2:12 PM

## 2016-01-17 NOTE — Progress Notes (Signed)
PROGRESS NOTE    Sandy [name]  [id number] DOB: 1976-01-23 DOA: 01/05/2016 PCP: No PCP Per Patient   Outpatient Specialists:  Dr. Donnetta Hutching    Brief Narrative:  Patient admitted with anasarca, progression of CKD. She was started on HD. Her first HD was 4-17. Has been clipped and plan is for right arm hematemesis axis today by Dr. Donnetta Hutching, patient status post left brachial artery thrombectomy and vein patch angioplasty on 4/24.  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 - Seen by nephrology, hemodialysis managed by renal -plan for right arm access today - Status post Left brachial artery thrombectomy and vein patch angioplasty  Metabolic acidosis - sodium bicarb to TID.   Atypical chest pain - troponin, first negative - Echo EF 60 % - EKG; peak T wave.   Anemia of chronic disease (renal failure) -transfuse for <7 -defer to renal  Accelerated HTN --Continue home medications --IV hydralazine prn --Hydralazine, labetalol.     DVT prophylaxis:  SQ Heparin  Code Status: Full   Family Communication: No family at bedside  Disposition Plan:  Home when stable   Consultants:   Nephrology  vascular   Procedures:  Left brachial artery thrombectomy and vein patch angioplasty 4/24 - Plan for right arm hemodialysis access  today      Subjective: Sleeping in bed, complains of left arm pain.  Objective: Filed Vitals:   01/17/16 1215 01/17/16 1230 01/17/16 1245 01/17/16 1300  BP: 179/86 165/84 159/88 175/80  Pulse: 78 76  73 80  Temp:    98.9 F (37.2 C)  TempSrc:      Resp: 14 10 12 14   Height:      Weight:      SpO2: 97% 97% 98% 94%    Intake/Output Summary (Last 24 hours) at 01/17/16 1315 Last data filed at 01/17/16 1200  Gross per 24 hour  Intake    970 ml  Output    115 ml  Net    855 ml   Filed Weights   01/15/16 1625 01/15/16 2032 01/16/16 2032  Weight: 99.6 kg (219 lb 9.3 oz) 96.4 kg (212 lb 8.4 oz) 96.8 kg (213 lb 6.5 oz)    Examination:  General exam: Awake, communicative, appropriate Chest: Good air entry bilaterally, no wheezing or rhonchi Cardiovascular: rrr, no rubs or gallops Abdomen: Soft, nontender, nondistended EXT: Pedal pulses felt bilaterally, L arm swelling    Data Reviewed: I have personally reviewed following labs and imaging studies  CBC:  Recent Labs Lab 01/13/16 0257 01/14/16 0453 01/15/16 0426 01/16/16 0515 01/17/16 0953 01/17/16 1000  WBC 12.0* 9.9 8.8 8.6 8.3  --   HGB 8.5* 8.2* 8.0* 7.8* 7.7* 8.2*  HCT 28.3* 28.0* 27.0* 26.8* 26.5* 24.0*  MCV 95.3 95.9 96.1 96.8 96.7  --   PLT 148* 152 140* 144* 168  --    Basic Metabolic Panel:  Recent Labs Lab 01/14/16 0453 01/15/16 0426 01/16/16 0515 01/17/16 0748 01/17/16 0953 01/17/16 0954 01/17/16 1000  NA 137 139 138 139  139 137 137 137  K 4.2 4.2 3.6 4.2  4.5 3.8 3.8 3.8  CL 100* 100* 101 104  105 102 102  --   CO2 26 26 29 24  22 25 25   --   GLUCOSE 74 77 86 80  79 86 86 87  BUN 31* 40* 16 32*  32* 30* 30*  --   CREATININE 4.77* 5.13* 3.55* 5.21*  5.30* 5.60* 5.60*  --   CALCIUM 8.0* 8.1* 7.7* 8.3*  8.4* 8.2* 8.2*  --   PHOS 6.1* 6.8* 4.9* 7.1* 7.0*  --   --    GFR: Estimated Creatinine Clearance: 16.1 mL/min (by C-G formula based on Cr of 5.6). Liver Function Tests:  Recent Labs Lab 01/14/16 0453 01/15/16 0426 01/16/16 0515 01/17/16 0748 01/17/16 0953  ALBUMIN 1.5* 1.4* 1.4* 1.4* 1.4*   No results for input(s): LIPASE, AMYLASE in the last 168 hours. No results for  input(s): AMMONIA in the last 168 hours. Coagulation Profile:  Recent Labs Lab 01/15/16 0426 01/17/16 0953  INR 0.94 0.89   Cardiac Enzymes: No results for input(s): CKTOTAL, CKMB, CKMBINDEX, TROPONINI in the last 168 hours. BNP (last 3 results) No results for input(s): PROBNP in the last 8760 hours. HbA1C: No results for input(s): HGBA1C in the last 72 hours. CBG:  Recent Labs Lab 01/11/16 2026  GLUCAP 114*   Lipid Profile: No results for input(s): CHOL, HDL, LDLCALC, TRIG, CHOLHDL, LDLDIRECT in the last 72 hours. Thyroid Function Tests: No results for input(s): TSH, T4TOTAL, FREET4, T3FREE, THYROIDAB in the last 72 hours. Anemia Panel: No results for input(s): VITAMINB12, FOLATE, FERRITIN, TIBC, IRON, RETICCTPCT in the last 72 hours. Urine analysis:    Component Value Date/Time   COLORURINE YELLOW 10/14/2015 1604   APPEARANCEUR CLOUDY* 10/14/2015 1604   LABSPEC 1.027 10/14/2015 1604   PHURINE 5.5 10/14/2015 [place] 10/14/2015 1604   HGBUR NEGATIVE 10/14/2015 [place] 10/14/2015 [place] 10/14/2015 1604   PROTEINUR >300* 10/14/2015 1604   NITRITE NEGATIVE 10/14/2015 1604   LEUKOCYTESUR NEGATIVE 10/14/2015 1604     Recent Results (from the past 240 hour(s))  Surgical pcr screen     Status: Abnormal   Collection Time: 01/07/16 10:08 PM  Result Value Ref Range Status   MRSA, PCR NEGATIVE NEGATIVE Final   Staphylococcus aureus POSITIVE (A) NEGATIVE Final    Comment:        The Xpert SA Assay (FDA approved for NASAL specimens in patients over 28 years of age), is one component of a comprehensive surveillance program.  Test performance has been validated by [facility] for patients greater than or equal to 31 year old. It is not intended to diagnose infection nor to guide or monitor treatment.       Anti-infectives    Start     Dose/Rate Route Frequency Ordered Stop   01/17/16 1300  cefUROXime  (ZINACEF) 1.5 g in dextrose 5 % 50 mL IVPB     1.5 g 100 mL/hr over 30 Minutes Intravenous To ShortStay Surgical 01/16/16 1237 01/17/16 1039   01/15/16 0600  cefUROXime (ZINACEF) 1.5 g in dextrose 5 % 50 mL IVPB     1.5 g 100 mL/hr over 30 Minutes Intravenous On call to O.R. 01/14/16 1932 01/15/16 1125   01/15/16 0600  cefUROXime (ZINACEF) 1.5 g in dextrose 5 % 50 mL IVPB  Status:  Discontinued     1.5 g 100 mL/hr over 30 Minutes Intravenous On call to O.R.  01/14/16 1932 01/14/16 1934   01/08/16 1200  cefUROXime (ZINACEF) 1.5 g in dextrose 5 % 50 mL IVPB     1.5 g 100 mL/hr over 30 Minutes Intravenous To Short Stay 01/07/16 1958 01/08/16 1245   01/08/16 0000  ceFAZolin (ANCEF) IVPB 1 g/50 mL premix  Status:  Discontinued    Comments:  Send with pt to OR   1 g 100 mL/hr over 30 Minutes Intravenous On call 01/07/16 1958 01/07/16 2010       Radiology Studies: No results found.      Scheduled Meds: . [MAR Hold] darbepoetin (ARANESP) injection - DIALYSIS  100 mcg Intravenous Q Sat-HD  . [MAR Hold] febuxostat  40 mg Oral Daily  . fentaNYL      . [MAR Hold] heparin  5,000 Units Subcutaneous 3 times per day  . [MAR Hold] hydrALAZINE  50 mg Oral 3 times per day  . [MAR Hold] labetalol  200 mg Oral BID  . oxyCODONE      . [MAR Hold] predniSONE  5 mg Oral Q breakfast  . [MAR Hold] sodium chloride flush  3 mL Intravenous Q12H   Continuous Infusions: . sodium chloride 10 mL/hr at 01/17/16 1003  . sodium chloride       LOS: 12 days    Time spent: 25 min    ELGERGAWY, DAWOOD, MD [facility] Pager [phone]  If 7PM-7AM, please contact night-coverage [email or web address] Password [email or web address] 01/17/2016, 1:15 PM

## 2016-01-17 NOTE — Transfer of Care (Signed)
Immediate Anesthesia Transfer of Care Note  Patient: Sandy [name]  Procedure(s) Performed: Procedure(s): Right Arm Brachial ARTERIOVENOUS FISTULA CREATION  (Right)  Patient Location: PACU  Anesthesia Type:General  Level of Consciousness: awake, alert , oriented and sedated  Airway & Oxygen Therapy: Patient Spontanous Breathing and Patient connected to nasal cannula oxygen  Post-op Assessment: Report given to RN, Post -op Vital signs reviewed and stable and Patient moving all extremities  Post vital signs: Reviewed and stable  Last Vitals:  Filed Vitals:   01/17/16 1155 01/17/16 1200  BP:  180/81  Pulse:  81  Temp: 36.8 C   Resp:  13    Last Pain:  Filed Vitals:   01/17/16 1201  PainSc: 8       Patients Stated Pain Goal: 2 ([id number])  Complications: No apparent anesthesia complications

## 2016-01-17 NOTE — Anesthesia Procedure Notes (Signed)
Procedure Name: LMA Insertion Date/Time: 01/17/2016 10:37 AM Performed by: Scheryl Darter Pre-anesthesia Checklist: Patient identified, Emergency Drugs available, Suction available and Patient being monitored Patient Re-evaluated:Patient Re-evaluated prior to inductionOxygen Delivery Method: Circle System Utilized Preoxygenation: Pre-oxygenation with 100% oxygen Intubation Type: IV induction Ventilation: Mask ventilation without difficulty LMA: LMA inserted LMA Size: 4.0 Number of attempts: 1 Airway Equipment and Method: Bite block Placement Confirmation: positive ETCO2 Tube secured with: Tape Dental Injury: Teeth and Oropharynx as per pre-operative assessment

## 2016-01-17 NOTE — Interval H&P Note (Signed)
History and Physical Interval Note:  01/17/2016 10:08 AM  Sandy [name]  has presented today for surgery, with the diagnosis of End Stage Renal Disease N18.6  The various methods of treatment have been discussed with the patient and family. After consideration of risks, benefits and other options for treatment, the patient has consented to  Procedure(s): ARTERIOVENOUS (AV) FISTULA CREATION VERSUS GRAFT INSERTION (Right) as a surgical intervention .  The patient's history has been reviewed, patient examined, no change in status, stable for surgery.  I have reviewed the patient's chart and labs.  Questions were answered to the patient's satisfaction.     Curt Jews

## 2016-01-17 NOTE — Progress Notes (Addendum)
Patient's labs were drawn this morning but ended up clotting, so pre op labs were not able to be processed. Patient was scheduled for surgery after lunch but was bumped up to this morning. Dr. Donnetta Hutching aware and stated it was fine to still proceed with surgery and they would check labs in pre op. Pre procedure checklist completed. Report called to short stay.  Joellen Jersey, RN.

## 2016-01-17 NOTE — Progress Notes (Signed)
This is a late entry.  Patient's left arm is more swollen than it was yesterday. Pulse is weaker, but fingers are still warm to touch. Dr. Waldron Labs notified this AM.   Joellen Jersey, RN.

## 2016-01-17 NOTE — Progress Notes (Signed)
Patient returned from OR. New AVF with positive bruit and thrill in right brachial area. Pink limb restriction band placed at this time. Patient A&Ox4. C/o pain in bilateral arms but still states her left arm is more sore than her right arm. Medicated per MAR. Meal tray ordered. Dialysis RN notified of patient's return to the unit and they will transport her to HD after she eats lunch.   Joellen Jersey, RN.

## 2016-01-17 NOTE — H&P (View-Only) (Signed)
Patient ID: Sandy [name], female   DOB: 03-31-1976, 40 y.o.   MRN: [id number] She currently in hemodialysis unit of with the dialysis via a right IJ catheter. I discussed the finding of occluded brachial artery and the her a thrombectomy and vein patch angioplasty of this. Explained decision not to use this as inflow for an AV graft with my concern potentially putting her hand at risk. Have recommended a right arm access. Will plan for this on Wednesday, 01/17/2015. Patient understands

## 2016-01-17 NOTE — Anesthesia Preprocedure Evaluation (Signed)
Anesthesia Evaluation  Patient identified by MRN, date of birth, ID band Patient awake    Reviewed: Allergy & Precautions, NPO status , Patient's Chart, lab work & pertinent test results  Airway Mallampati: II   Neck ROM: full    Dental   Pulmonary former smoker,    breath sounds clear to auscultation       Cardiovascular hypertension,  Rhythm:regular Rate:Normal     Neuro/Psych    GI/Hepatic   Endo/Other  obese  Renal/GU ESRF and DialysisRenal disease     Musculoskeletal   Abdominal   Peds  Hematology   Anesthesia Other Findings   Reproductive/Obstetrics                             Anesthesia Physical Anesthesia Plan  ASA: III  Anesthesia Plan: General   Post-op Pain Management:    Induction: Intravenous  Airway Management Planned: LMA  Additional Equipment:   Intra-op Plan:   Post-operative Plan:   Informed Consent:   Plan Discussed with: CRNA, Anesthesiologist and Surgeon  Anesthesia Plan Comments:         Anesthesia Quick Evaluation

## 2016-01-17 NOTE — Progress Notes (Signed)
[facility] ROUNDING NOTE   Subjective:   Interval History:  Dialysis dependent nephrotic syndrome.  No UOP/24 hours.  Patient notes pain in her LUE.  She reports that she tolerated surgery well today.  No other concerns.  Objective:  Vital signs in last 24 hours:  Temp:  [98.4 F (36.9 C)-98.9 F (37.2 C)] 98.4 F (36.9 C) (04/26 0408) Pulse Rate:  [75-91] 84 (04/26 0408) Resp:  [18-19] 19 (04/26 0408) BP: (115-158)/(66-89) 158/89 mmHg (04/26 0408) SpO2:  [98 %-100 %] 98 % (04/26 0408) Weight:  [213 lb 6.5 oz (96.8 kg)] 213 lb 6.5 oz (96.8 kg) (04/25 2032)  Weight change: -6 lb 2.8 oz (-2.8 kg) Filed Weights   01/15/16 1625 01/15/16 2032 01/16/16 2032  Weight: 219 lb 9.3 oz (99.6 kg) 212 lb 8.4 oz (96.4 kg) 213 lb 6.5 oz (96.8 kg)    Intake/Output: I/O last 3 completed shifts: In: 1020 [P.O.:1020] Out: 3500 [Other:3500]   Intake/Output this shift:     Physical exam: Gen: awake, alert, sitting in bed, NAD HEENT: MMM Cardio: RRR, no murmurs Pulm: CTAB, normal WOB on room air Ext: compression hose in place, continues to have LE edema.  Also notable sacral edema +1-2. LUE with some swelling and pain.  No increased warmth or erythema.  Basic Metabolic Panel:  Recent Labs Lab 01/12/16 0515 01/13/16 0257 01/14/16 0453 01/15/16 0426 01/16/16 0515  NA 138 136 137 139 138  K 3.6 4.6 4.2 4.2 3.6  CL 102 100* 100* 100* 101  CO2 25 26 26 26 29   GLUCOSE 82 79 74 77 86  BUN 36* 21* 31* 40* 16  CREATININE 4.21* 3.80* 4.77* 5.13* 3.55*  CALCIUM 7.9* 7.8* 8.0* 8.1* 7.7*  PHOS 6.2* 4.5 6.1* 6.8* 4.9*    Liver Function Tests:  Recent Labs Lab 01/12/16 0515 01/13/16 0257 01/14/16 0453 01/15/16 0426 01/16/16 0515  ALBUMIN 1.4* 1.3* 1.5* 1.4* 1.4*   No results for input(s): LIPASE, AMYLASE in the last 168 hours. No results for input(s): AMMONIA in the last 168 hours.  CBC:  Recent Labs Lab 01/12/16 0515 01/13/16 0257 01/14/16 0453  01/15/16 0426 01/16/16 0515  WBC 9.9 12.0* 9.9 8.8 8.6  HGB 8.2* 8.5* 8.2* 8.0* 7.8*  HCT 27.1* 28.3* 28.0* 27.0* 26.8*  MCV 94.1 95.3 95.9 96.1 96.8  PLT 166 148* 152 140* 144*    Cardiac Enzymes: No results for input(s): CKTOTAL, CKMB, CKMBINDEX, TROPONINI in the last 168 hours.  BNP: Invalid input(s): POCBNP  CBG:  Recent Labs Lab 01/11/16 2026  GLUCAP 114*    Microbiology: Results for orders placed or performed during the hospital encounter of 01/05/16  Surgical pcr screen     Status: Abnormal   Collection Time: 01/07/16 10:08 PM  Result Value Ref Range Status   MRSA, PCR NEGATIVE NEGATIVE Final   Staphylococcus aureus POSITIVE (A) NEGATIVE Final    Comment:        The Xpert SA Assay (FDA approved for NASAL specimens in patients over 58 years of age), is one component of a comprehensive surveillance program.  Test performance has been validated by [facility] for patients greater than or equal to 68 year old. It is not intended to diagnose infection nor to guide or monitor treatment.     Coagulation Studies:  Recent Labs  01/15/16 0426  LABPROT 12.8  INR 0.94    Urinalysis: No results for input(s): COLORURINE, LABSPEC, PHURINE, GLUCOSEU, HGBUR, BILIRUBINUR, KETONESUR, PROTEINUR, UROBILINOGEN, NITRITE, LEUKOCYTESUR in the  last 72 hours.  Invalid input(s): APPERANCEUR    Imaging: No results found.   Medications:   . sodium chloride 10 mL/hr at 01/15/16 0930   . cefUROXime (ZINACEF)  IV  1.5 g Intravenous To SS-Surg  . [START ON 01/20/2016] darbepoetin (ARANESP) injection - DIALYSIS  100 mcg Intravenous Q Sat-HD  . febuxostat  40 mg Oral Daily  . heparin  5,000 Units Subcutaneous 3 times per day  . hydrALAZINE  50 mg Oral 3 times per day  . labetalol  200 mg Oral BID  . predniSONE  5 mg Oral Q breakfast  . 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  R IJ HD catheter placement 4/17. s/p RUE brachiocephalic fistula [id number].  K stable this am 3.8.  Cr 5.6.  Had arterial occlusion of AVF. Plan for HD today. 2. Hypertension/volume - Volume overloaded.  +Sacral edema and LE edema on exam. BP elevated. She is on labetalol and scheduled hydralazine. Continue fluid removal. 3. Pains in her legs- On uloric 4. Anemia - Hgb  7.8 getting ferric gluconate daliy 5. Bones- last PTH 132 as an OP- no meds needed  6. LUE pain - likely secondary to recent surgical procedure.  Could consider ultrasound.   LOS: [place], DO 01/17/16 @8 :24 AM    I have seen and examined this patient and agree with plan as outlined by Dr. Lajuana Ripple.  Still c/o left arm pain, right upper arm BC AVF +T/B and nontender.  Plan for HD today to keep her on her outpatient schedule.  She has a spot at [facility] on MWF schedule.  If she can be discharged to home tomorrow, she can follow up for her first outpatient treatment, otherwise will plan for HD here on Friday.  Hopeful discharge soon.  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

## 2016-01-18 ENCOUNTER — Inpatient Hospital Stay (HOSPITAL_COMMUNITY): Payer: Medicaid Other

## 2016-01-18 ENCOUNTER — Encounter (HOSPITAL_COMMUNITY): Payer: Medicaid Other

## 2016-01-18 ENCOUNTER — Encounter (HOSPITAL_COMMUNITY): Payer: Self-pay | Admitting: Vascular Surgery

## 2016-01-18 DIAGNOSIS — E8779 Other fluid overload: Secondary | ICD-10-CM

## 2016-01-18 DIAGNOSIS — I10 Essential (primary) hypertension: Secondary | ICD-10-CM

## 2016-01-18 DIAGNOSIS — R609 Edema, unspecified: Secondary | ICD-10-CM

## 2016-01-18 LAB — RENAL FUNCTION PANEL
ANION GAP: 9 (ref 5–15)
Albumin: 1.5 g/dL — ABNORMAL LOW (ref 3.5–5.0)
BUN: 15 mg/dL (ref 6–20)
CALCIUM: 7.7 mg/dL — AB (ref 8.9–10.3)
CHLORIDE: 102 mmol/L (ref 101–111)
CO2: 28 mmol/L (ref 22–32)
Creatinine, Ser: 3.72 mg/dL — ABNORMAL HIGH (ref 0.44–1.00)
GFR calc non Af Amer: 15 mL/min — ABNORMAL LOW (ref >60)
GFR, EST AFRICAN AMERICAN: 17 mL/min — AB (ref >60)
GLUCOSE: 91 mg/dL (ref 65–99)
POTASSIUM: 4.1 mmol/L (ref 3.5–5.1)
Phosphorus: 4.4 mg/dL (ref 2.5–4.6)
Sodium: 139 mmol/L (ref 135–145)

## 2016-01-18 LAB — CBC
HEMATOCRIT: 26.7 % — AB (ref 36.0–46.0)
HEMOGLOBIN: 7.6 g/dL — AB (ref 12.0–15.0)
MCH: 28.1 pg (ref 26.0–34.0)
MCHC: 28.5 g/dL — AB (ref 30.0–36.0)
MCV: 98.9 fL (ref 78.0–100.0)
Platelets: 190 10*3/uL (ref 150–400)
RBC: 2.7 MIL/uL — AB (ref 3.87–5.11)
RDW: 17.8 % — ABNORMAL HIGH (ref 11.5–15.5)
WBC: 8.8 10*3/uL (ref 4.0–10.5)

## 2016-01-18 NOTE — Anesthesia Postprocedure Evaluation (Signed)
Anesthesia Post Note  Patient: Sandy [name]  Procedure(s) Performed: Procedure(s) (LRB): Right Arm Brachial ARTERIOVENOUS FISTULA CREATION  (Right)  Patient location during evaluation: PACU Anesthesia Type: General Level of consciousness: awake and alert and patient cooperative Pain management: pain level controlled Vital Signs Assessment: post-procedure vital signs reviewed and stable Respiratory status: spontaneous breathing and respiratory function stable Cardiovascular status: stable Anesthetic complications: no    Last Vitals:  Filed Vitals:   01/18/16 0545 01/18/16 0811  BP: 146/70 163/79  Pulse: 97 94  Temp: 37.1 C 37.5 C  Resp: 22 20    Last Pain:  Filed Vitals:   01/18/16 1428  PainSc: 10-Worst pain ever                 Franke Menter S

## 2016-01-18 NOTE — Progress Notes (Signed)
PROGRESS NOTE    Sandy [name]  [id number] DOB: 1976/01/28 DOA: 01/05/2016 PCP: No PCP Per Patient   Outpatient Specialists:  Dr. Donnetta Hutching    Brief Narrative:  Patient admitted with anasarca, progression of CKD. She was started on HD. Her first HD was 4-17. Has been clipped and plan is for right arm hematemesis axis today by Dr. Donnetta Hutching, patient status post left brachial artery thrombectomy and vein patch angioplasty on 4/24.  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 - Seen by nephrology, hemodialysis managed by renal - plan for right arm access today - Status post Left brachial artery thrombectomy and vein patch angioplasty - Left upper extremity pain and swelling, venous Doppler significant for superficial thrombosis, no evidence of DVT, recommendation is for elevation and cool compress.  Metabolic acidosis - sodium bicarb to TID.   Atypical chest pain - troponin negative X3 - Echo EF 60 %  Anemia of chronic disease (renal failure) -transfuse for <7 -defer to renal  Accelerated HTN --Continue home medications --IV hydralazine prn --Hydralazine, labetalol.     DVT prophylaxis:  SQ Heparin  Code Status: Full   Family Communication: No family at bedside  Disposition Plan:  Home when stable   Consultants:   Nephrology  vascular   Procedures:  Left brachial artery thrombectomy and vein patch angioplasty 4/24 Right upper arm brachiocephalic fistula [id number]      Subjective: Sitting in bed, complains of left arm  pain.  Objective: Filed Vitals:   01/17/16 1950 01/17/16 2134 01/18/16 0545 01/18/16 0811  BP: 164/83 142/65 146/70 163/79  Pulse: 79 92 97 94  Temp: 98.1 F (36.7 C) 98.9 F (37.2 C) 98.8 F (37.1 C) 99.5 F (37.5 C)  TempSrc: Oral   Oral  Resp: 16 20 22 20   Height:      Weight:      SpO2:  99% 97% 97%    Intake/Output Summary (Last 24 hours) at 01/18/16 1437 Last data filed at 01/18/16 1347  Gross per 24 hour  Intake    480 ml  Output   2120 ml  Net  -1640 ml   Filed Weights   01/15/16 2032 01/16/16 2032 01/17/16 1619  Weight: 96.4 kg (212 lb 8.4 oz) 96.8 kg (213 lb 6.5 oz) 99.5 kg (219 lb 5.7 oz)    Examination:  General exam: Awake, communicative, appropriate Chest: Good air entry bilaterally, no wheezing or rhonchi Cardiovascular: rrr, no rubs or gallops Abdomen: Soft, nontender, nondistended EXT: Pedal pulses felt bilaterally, L arm swelling    Data Reviewed: I have personally reviewed following labs and imaging studies  CBC:  Recent Labs Lab 01/14/16 0453 01/15/16 0426 01/16/16 0515 01/17/16 0953 01/17/16 1000 01/18/16 0455  WBC 9.9 8.8 8.6 8.3  --  8.8  HGB 8.2* 8.0* 7.8* 7.7* 8.2* 7.6*  HCT 28.0* 27.0* 26.8* 26.5* 24.0* 26.7*  MCV 95.9 96.1 96.8 96.7  --  98.9  PLT 152 140* 144* 168  --  [id number]   Basic Metabolic Panel:  Recent Labs Lab 01/15/16 0426 01/16/16  [id number] 01/17/16 0748 01/17/16 0953 01/17/16 0954 01/17/16 1000 01/18/16 0455  NA 139 138 139  139 137 137 137 139  K 4.2 3.6 4.2  4.5 3.8 3.8 3.8 4.1  CL 100* 101 104  105 102 102  --  102  CO2 26 29 24  22 25 25   --  28  GLUCOSE 77 86 80  79 86 86 87 91  BUN 40* 16 32*  32* 30* 30*  --  15  CREATININE 5.13* 3.55* 5.21*  5.30* 5.60* 5.60*  --  3.72*  CALCIUM 8.1* 7.7* 8.3*  8.4* 8.2* 8.2*  --  7.7*  PHOS 6.8* 4.9* 7.1* 7.0*  --   --  4.4   GFR: Estimated Creatinine Clearance: 24.6 mL/min (by C-G formula based on Cr of 3.72). Liver Function Tests:  Recent Labs Lab  01/15/16 0426 01/16/16 0515 01/17/16 0748 01/17/16 0953 01/18/16 0455  ALBUMIN 1.4* 1.4* 1.4* 1.4* 1.5*   No results for input(s): LIPASE, AMYLASE in the last 168 hours. No results for input(s): AMMONIA in the last 168 hours. Coagulation Profile:  Recent Labs Lab 01/15/16 0426 01/17/16 0953  INR 0.94 0.89   Cardiac Enzymes: No results for input(s): CKTOTAL, CKMB, CKMBINDEX, TROPONINI in the last 168 hours. BNP (last 3 results) No results for input(s): PROBNP in the last 8760 hours. HbA1C: No results for input(s): HGBA1C in the last 72 hours. CBG:  Recent Labs Lab 01/11/16 2026  GLUCAP 114*   Lipid Profile: No results for input(s): CHOL, HDL, LDLCALC, TRIG, CHOLHDL, LDLDIRECT in the last 72 hours. Thyroid Function Tests: No results for input(s): TSH, T4TOTAL, FREET4, T3FREE, THYROIDAB in the last 72 hours. Anemia Panel: No results for input(s): VITAMINB12, FOLATE, FERRITIN, TIBC, IRON, RETICCTPCT in the last 72 hours. Urine analysis:    Component Value Date/Time   COLORURINE YELLOW 10/14/2015 1604   APPEARANCEUR CLOUDY* 10/14/2015 1604   LABSPEC 1.027 10/14/2015 1604   PHURINE 5.5 10/14/2015 [place] 10/14/2015 1604   HGBUR NEGATIVE 10/14/2015 [place] 10/14/2015 [place] 10/14/2015 1604   PROTEINUR >300* 10/14/2015 1604   NITRITE NEGATIVE 10/14/2015 1604   LEUKOCYTESUR NEGATIVE 10/14/2015 1604     No results found for this or any previous visit (from the past 240 hour(s)).    Anti-infectives    Start     Dose/Rate Route Frequency Ordered Stop   01/17/16 1300  cefUROXime (ZINACEF) 1.5 g in dextrose 5 % 50 mL IVPB     1.5 g 100 mL/hr over 30 Minutes Intravenous To ShortStay Surgical 01/16/16 1237 01/17/16 1039   01/15/16 0600  cefUROXime (ZINACEF) 1.5 g in dextrose 5 % 50 mL IVPB     1.5 g 100 mL/hr over 30 Minutes Intravenous On call to O.R. 01/14/16 1932 01/15/16 1125   01/15/16 0600  cefUROXime  (ZINACEF) 1.5 g in dextrose 5 % 50 mL IVPB  Status:  Discontinued     1.5 g 100 mL/hr over 30 Minutes Intravenous On call to O.R. 01/14/16 1932 01/14/16 1934   01/08/16 1200  cefUROXime (ZINACEF) 1.5 g in dextrose 5 % 50 mL IVPB     1.5 g 100 mL/hr over 30 Minutes Intravenous To Short Stay 01/07/16 1958 01/08/16 1245   01/08/16 0000  ceFAZolin (ANCEF) IVPB 1 g/50 mL premix  Status:  Discontinued    Comments:  Send with pt to OR   1 g 100 mL/hr over 30 Minutes Intravenous On call 01/07/16  1958 01/07/16 2010       Radiology Studies: No results found.      Scheduled Meds: . [START ON 01/20/2016] darbepoetin (ARANESP) injection - DIALYSIS  100 mcg Intravenous Q Sat-HD  . febuxostat  40 mg Oral Daily  . heparin  5,000 Units Subcutaneous 3 times per day  . hydrALAZINE  50 mg Oral 3 times per day  . labetalol  200 mg Oral BID  . predniSONE  2.5 mg Oral Q breakfast  . sodium chloride flush  3 mL Intravenous Q12H   Continuous Infusions: . sodium chloride 10 mL/hr at 01/17/16 1003  . sodium chloride       LOS: 13 days    Time spent: 20 min    Laiya Wisby, MD [facility] Pager [phone]  If 7PM-7AM, please contact night-coverage [email or web address] Password [facility] 01/18/2016, 2:37 PM

## 2016-01-18 NOTE — Progress Notes (Signed)
[facility] ROUNDING NOTE   Subjective:   Interval History:  Dialysis dependent nephrotic syndrome.  220cc UOP/24 hours.  Patient had LUE u/s performed this am.  She reports that they found a clot.  Objective:  Vital signs in last 24 hours:  Temp:  [97.4 F (36.3 C)-99.5 F (37.5 C)] 99.5 F (37.5 C) (04/27 0811) Pulse Rate:  [68-97] 94 (04/27 0811) Resp:  [10-22] 20 (04/27 0811) BP: (142-180)/(65-88) 163/79 mmHg (04/27 0811) SpO2:  [94 %-99 %] 97 % (04/27 0811) Weight:  [219 lb 5.7 oz (99.5 kg)] 219 lb 5.7 oz (99.5 kg) (04/26 1619)  Weight change: 5 lb 15.2 oz (2.7 kg) Filed Weights   01/15/16 2032 01/16/16 2032 01/17/16 1619  Weight: 212 lb 8.4 oz (96.4 kg) 213 lb 6.5 oz (96.8 kg) 219 lb 5.7 oz (99.5 kg)    Intake/Output: I/O last 3 completed shifts: In: 33 [P.O.:420; I.V.:550] Out: 2235 [Urine:220; Other:2000; Blood:15]   Intake/Output this shift:     Physical exam: Gen: awake, alert, NAD HEENT: MMM Cardio: Rate regular Pulm: normal WOB on room air Ext: compression hose in place, continues to have LE edema.  Also notable sacral edema +1-2. LUE with swelling and pain.   Basic Metabolic Panel:  Recent Labs Lab 01/15/16 0426 01/16/16 0515 01/17/16 0748 01/17/16 0953 01/17/16 0954 01/17/16 1000 01/18/16 0455  NA 139 138 139  139 137 137 137 139  K 4.2 3.6 4.2  4.5 3.8 3.8 3.8 4.1  CL 100* 101 104  105 102 102  --  102  CO2 26 29 24  22 25 25   --  28  GLUCOSE 77 86 80  79 86 86 87 91  BUN 40* 16 32*  32* 30* 30*  --  15  CREATININE 5.13* 3.55* 5.21*  5.30* 5.60* 5.60*  --  3.72*  CALCIUM 8.1* 7.7* 8.3*  8.4* 8.2* 8.2*  --  7.7*  PHOS 6.8* 4.9* 7.1* 7.0*  --   --  4.4    Liver Function Tests:  Recent Labs Lab 01/15/16 0426 01/16/16 0515 01/17/16 0748 01/17/16 0953 01/18/16 0455  ALBUMIN 1.4* 1.4* 1.4* 1.4* 1.5*   No results for input(s): LIPASE, AMYLASE in the last 168 hours. No results for input(s): AMMONIA in the last  168 hours.  CBC:  Recent Labs Lab 01/14/16 0453 01/15/16 0426 01/16/16 0515 01/17/16 0953 01/17/16 1000 01/18/16 0455  WBC 9.9 8.8 8.6 8.3  --  8.8  HGB 8.2* 8.0* 7.8* 7.7* 8.2* 7.6*  HCT 28.0* 27.0* 26.8* 26.5* 24.0* 26.7*  MCV 95.9 96.1 96.8 96.7  --  98.9  PLT 152 140* 144* 168  --  190    Cardiac Enzymes: No results for input(s): CKTOTAL, CKMB, CKMBINDEX, TROPONINI in the last 168 hours.  BNP: Invalid input(s): POCBNP  CBG:  Recent Labs Lab 01/11/16 2026  GLUCAP 114*    Microbiology: Results for orders placed or performed during the hospital encounter of 01/05/16  Surgical pcr screen     Status: Abnormal   Collection Time: 01/07/16 10:08 PM  Result Value Ref Range Status   MRSA, PCR NEGATIVE NEGATIVE Final   Staphylococcus aureus POSITIVE (A) NEGATIVE Final    Comment:        The Xpert SA Assay (FDA approved for NASAL specimens in patients over 65 years of age), is one component of a comprehensive surveillance program.  Test performance has been validated by [facility] for patients greater than or equal to 1 year  old. It is not intended to diagnose infection nor to guide or monitor treatment.     Coagulation Studies:  Recent Labs  01/17/16 0953  LABPROT 12.2  INR 0.89    Urinalysis: No results for input(s): COLORURINE, LABSPEC, PHURINE, GLUCOSEU, HGBUR, BILIRUBINUR, KETONESUR, PROTEINUR, UROBILINOGEN, NITRITE, LEUKOCYTESUR in the last 72 hours.  Invalid input(s): APPERANCEUR    Imaging: No results found.   Medications:   . sodium chloride 10 mL/hr at 01/17/16 1003  . sodium chloride     . [START ON 01/20/2016] darbepoetin (ARANESP) injection - DIALYSIS  100 mcg Intravenous Q Sat-HD  . febuxostat  40 mg Oral Daily  . heparin  5,000 Units Subcutaneous 3 times per day  . hydrALAZINE  50 mg Oral 3 times per day  . labetalol  200 mg Oral BID  . predniSONE  2.5 mg Oral Q breakfast  . 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  R IJ HD catheter placement 4/17. s/p RUE brachiocephalic fistula [id number]. UOP 220cc/ 24h.  K stable this am 4.1.  Cr 3.72.   2. Hypertension/volume - Volume overloaded.  BP elevated. She is on labetalol and scheduled hydralazine. Continue fluid removal. 3. Pains in her legs- On uloric 4. Anemia - Hgb  7.6 getting ferric gluconate daliy 5. Bones- last PTH 132 as an OP- no meds needed  6. LUE pain - likely secondary to recent surgical procedure.  No DVT.  Superficial thrombosis noted in the left cephalic vein, at the [facility] level.    LOS: Luis Llorens Torres, DO 01/18/16 @8 :36 AM    I have seen and examined this patient and agree with plan as outlined by Dr. Lajuana Ripple.  For HD tomorrow and d/c to home and will [facility] on Monday for outpatient HD.  Continue to taper prednisone to off over the next 3 weeks. Broadus John A Li Fragoso,MD 01/18/2016 1:31 PM

## 2016-01-18 NOTE — Progress Notes (Addendum)
*  PRELIMINARY RESULTS* Vascular Ultrasound Left upper extremity venous duplex has been completed.  Preliminary findings: negative for DVT. Superficial thrombosis noted in the left cephalic vein, at the [facility] level.  Gave verbal results to patient's nurse Carolyne Fiscal.   Landry Mellow, RDMS, RVT  01/18/2016, 10:50 AM

## 2016-01-18 NOTE — Progress Notes (Signed)
  [facility] Progress Note  Patient with left upper arm swelling and pain. No evidence of DVT on duplex today. There is superficial thrombophlebitis of the left cephalic vein. Advised patient to apply cool compresses and to elevate her arm. Ok from vascular standpoint for d/c. Follow-up with Dr. Donnetta Hutching in 4 weeks with duplex.   Virgina Jock, PA-C [facility] Office: [phone] Pager: [phone] 01/18/2016 11:16 AM

## 2016-01-18 NOTE — Progress Notes (Signed)
Patient ID: Sandy [name], female   DOB: Sep 11, 1976, 40 y.o.   MRN: [id number] Complains of left arm pain. Her right antecubital incision is healing quite nicely. She has an excellent thrill with a 2+ right radial pulse and no evidence of steal  Her left arm is swollen throughout its course with some erythema. Antecubital incision looks okay.  Unclear as to the cause of her left arm persistent swelling. No good cause for left arm DVT from surgical standpoint. Certainly was related to the brachial artery repair. We will obtain left arm venous duplex today to rule out DVT. Has never had central catheterization on the left side. Does have a right IJ catheter.

## 2016-01-19 ENCOUNTER — Inpatient Hospital Stay (HOSPITAL_COMMUNITY): Payer: Medicaid Other

## 2016-01-19 DIAGNOSIS — R509 Fever, unspecified: Secondary | ICD-10-CM

## 2016-01-19 LAB — RENAL FUNCTION PANEL
ANION GAP: 10 (ref 5–15)
Albumin: 1.4 g/dL — ABNORMAL LOW (ref 3.5–5.0)
BUN: 23 mg/dL — ABNORMAL HIGH (ref 6–20)
CALCIUM: 8.2 mg/dL — AB (ref 8.9–10.3)
CO2: 26 mmol/L (ref 22–32)
Chloride: 101 mmol/L (ref 101–111)
Creatinine, Ser: 5.74 mg/dL — ABNORMAL HIGH (ref 0.44–1.00)
GFR calc non Af Amer: 9 mL/min — ABNORMAL LOW (ref >60)
GFR, EST AFRICAN AMERICAN: 10 mL/min — AB (ref >60)
Glucose, Bld: 76 mg/dL (ref 65–99)
POTASSIUM: 4.7 mmol/L (ref 3.5–5.1)
Phosphorus: 6 mg/dL — ABNORMAL HIGH (ref 2.5–4.6)
SODIUM: 137 mmol/L (ref 135–145)

## 2016-01-19 LAB — CBC WITH DIFFERENTIAL/PLATELET
BASOS ABS: 0 10*3/uL (ref 0.0–0.1)
Basophils Relative: 0 %
Eosinophils Absolute: 0.3 10*3/uL (ref 0.0–0.7)
Eosinophils Relative: 3 %
HEMATOCRIT: 25.4 % — AB (ref 36.0–46.0)
Hemoglobin: 7.4 g/dL — ABNORMAL LOW (ref 12.0–15.0)
LYMPHS ABS: 2.2 10*3/uL (ref 0.7–4.0)
LYMPHS PCT: 21 %
MCH: 29.4 pg (ref 26.0–34.0)
MCHC: 29.1 g/dL — ABNORMAL LOW (ref 30.0–36.0)
MCV: 100.8 fL — AB (ref 78.0–100.0)
MONO ABS: 1.3 10*3/uL — AB (ref 0.1–1.0)
Monocytes Relative: 13 %
NEUTROS ABS: 6.5 10*3/uL (ref 1.7–7.7)
Neutrophils Relative %: 63 %
Platelets: 212 10*3/uL (ref 150–400)
RBC: 2.52 MIL/uL — AB (ref 3.87–5.11)
RDW: 17.3 % — ABNORMAL HIGH (ref 11.5–15.5)
WBC: 10.3 10*3/uL (ref 4.0–10.5)

## 2016-01-19 LAB — ABO/RH: ABO/RH(D): A NEG

## 2016-01-19 LAB — PREPARE RBC (CROSSMATCH)

## 2016-01-19 MED ORDER — LABETALOL HCL 300 MG PO TABS
300.0000 mg | ORAL_TABLET | Freq: Two times a day (BID) | ORAL | Status: DC
  Administered 2016-01-19 – 2016-01-22 (×6): 300 mg via ORAL
  Filled 2016-01-19 (×6): qty 1

## 2016-01-19 MED ORDER — DARBEPOETIN ALFA 100 MCG/0.5ML IJ SOSY
100.0000 ug | PREFILLED_SYRINGE | Freq: Once | INTRAMUSCULAR | Status: AC
  Administered 2016-01-19: 100 ug via SUBCUTANEOUS
  Filled 2016-01-19: qty 0.5

## 2016-01-19 MED ORDER — DARBEPOETIN ALFA 100 MCG/0.5ML IJ SOSY: 100.0000 ug | PREFILLED_SYRINGE | INTRAMUSCULAR | Status: DC

## 2016-01-19 MED ORDER — HYDRALAZINE HCL 50 MG PO TABS
75.0000 mg | ORAL_TABLET | Freq: Three times a day (TID) | ORAL | Status: DC
  Administered 2016-01-19 – 2016-01-22 (×8): 75 mg via ORAL
  Filled 2016-01-19 (×8): qty 1

## 2016-01-19 MED ORDER — SENNOSIDES-DOCUSATE SODIUM 8.6-50 MG PO TABS
2.0000 | ORAL_TABLET | Freq: Two times a day (BID) | ORAL | Status: DC
  Administered 2016-01-19 – 2016-01-22 (×6): 2 via ORAL
  Filled 2016-01-19 (×6): qty 2

## 2016-01-19 MED ORDER — PANTOPRAZOLE SODIUM 40 MG PO TBEC
40.0000 mg | DELAYED_RELEASE_TABLET | Freq: Every day | ORAL | Status: DC
  Administered 2016-01-19 – 2016-01-22 (×4): 40 mg via ORAL
  Filled 2016-01-19 (×4): qty 1

## 2016-01-19 MED ORDER — POLYETHYLENE GLYCOL 3350 17 G PO PACK
34.0000 g | PACK | Freq: Every day | ORAL | Status: DC
  Administered 2016-01-19 – 2016-01-22 (×3): 34 g via ORAL
  Filled 2016-01-19 (×3): qty 2

## 2016-01-19 MED ORDER — IBUPROFEN 400 MG PO TABS
400.0000 mg | ORAL_TABLET | Freq: Once | ORAL | Status: DC
  Filled 2016-01-19: qty 1

## 2016-01-19 MED ORDER — SEVELAMER CARBONATE 800 MG PO TABS
1600.0000 mg | ORAL_TABLET | Freq: Three times a day (TID) | ORAL | Status: DC
  Administered 2016-01-19 – 2016-01-22 (×9): 1600 mg via ORAL
  Filled 2016-01-19 (×10): qty 2

## 2016-01-19 MED ORDER — SODIUM CHLORIDE 0.9 % IV SOLN: Freq: Once | INTRAVENOUS | Status: DC

## 2016-01-19 MED ORDER — DARBEPOETIN ALFA 100 MCG/0.5ML IJ SOSY
100.0000 ug | PREFILLED_SYRINGE | Freq: Once | INTRAMUSCULAR | Status: DC
  Filled 2016-01-19 (×2): qty 0.5

## 2016-01-19 MED ORDER — HYDRALAZINE HCL 20 MG/ML IJ SOLN
INTRAMUSCULAR | Status: AC
  Filled 2016-01-19: qty 1

## 2016-01-19 MED ORDER — BISACODYL 5 MG PO TBEC
10.0000 mg | DELAYED_RELEASE_TABLET | Freq: Once | ORAL | Status: AC
  Administered 2016-01-19: 10 mg via ORAL
  Filled 2016-01-19: qty 2

## 2016-01-19 MED ORDER — GUAIFENESIN ER 600 MG PO TB12
1200.0000 mg | ORAL_TABLET | Freq: Two times a day (BID) | ORAL | Status: DC
  Administered 2016-01-19 – 2016-01-22 (×6): 1200 mg via ORAL
  Filled 2016-01-19 (×7): qty 2

## 2016-01-19 NOTE — Progress Notes (Signed)
PROGRESS NOTE    Sandy [name]  [id number] DOB: 1976-04-26 DOA: 01/05/2016 PCP: No PCP Per Patient   Outpatient Specialists:  Dr. Donnetta Hutching    Brief Narrative:  Patient admitted with anasarca, progression of CKD. She was started on HD. Her first HD was 4-17. Has been clipped and plan is for right arm hematemesis axis today by Dr. Donnetta Hutching, patient status post left brachial artery thrombectomy and vein patch angioplasty on 4/24.  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 ( [place]) on a Monday, Wednesday and Friday 2nd shift schedule.The patient has been instructed to arrive at 11:10 on her 1st day of treatment. - Seen by nephrology, hemodialysis managed by renal - plan for right arm access today - Status post Left brachial artery thrombectomy and vein patch angioplasty - Left upper extremity pain and swelling, venous Doppler significant for superficial thrombosis, no evidence of DVT, recommendation is for elevation and cool compress.  Fever - patient with fever 100.4 this am, reports cough, most likely due to atelectasis, will check 2 view chest x-ray, will start on incentive spirometry, flutter valve, blood cultures were obtained during dialysis, discussed with renal, will be followed as an outpatient upon discharge. - As well fever can be related to thrombophlebitis  Metabolic acidosis - sodium bicarb to TID.   Atypical chest pain - troponin negative X3 - Echo EF 60 %  Anemia of chronic disease (renal failure) -transfuse for <7 -defer to renal  Accelerated HTN --Continue home medications --IV hydralazine prn --Hydralazine, labetalol.     DVT prophylaxis:  SQ  Heparin  Code Status: Full   Family Communication: No family at bedside  Disposition Plan:  Home when stable   Consultants:   Nephrology  vascular   Procedures:  Left brachial artery thrombectomy and vein patch angioplasty 4/24 Right upper arm brachiocephalic fistula [id number]      Subjective: Sitting in bed, complains of left arm pain.fever of 100.4 overnight  Objective: Filed Vitals:   01/19/16 1230 01/19/16 1254 01/19/16 1322 01/19/16 1618  BP: 177/84 167/76 143/66 129/59  Pulse: 89 87 109 87  Temp: 99.3 F (37.4 C) 99.2 F (37.3 C) 99.4 F (37.4 C) 99.5 F (37.5 C)  TempSrc:  Oral Oral Oral  Resp: 22 18 17 16   Height:      Weight:  96.2 kg (212 lb 1.3 oz)    SpO2:  95% 97% 96%    Intake/Output Summary (Last 24 hours) at 01/19/16 1739 Last data filed at 01/19/16 1400  Gross per 24 hour  Intake    575 ml  Output   2565 ml  Net  -1990 ml   Filed Weights   01/18/16 2145 01/19/16 0827 01/19/16 1254  Weight: 97.977 kg (216 lb) 98.2 kg (216 lb 7.9 oz) 96.2 kg (212 lb 1.3 oz)    Examination:  General exam: Awake, communicative, appropriate Chest: Good air entry bilaterally, no wheezing or rhonchi Cardiovascular: rrr, no rubs or gallops Abdomen: Soft, nontender, nondistended EXT: Pedal pulses felt bilaterally, L arm swelling    Data Reviewed: I have personally reviewed following labs and imaging studies  CBC:  Recent Labs Lab 01/15/16 0426 01/16/16 0515 01/17/16 0953 01/17/16 1000 01/18/16  0455 01/19/16 0918  WBC 8.8 8.6 8.3  --  8.8 10.3  NEUTROABS  --   --   --   --   --  6.5  HGB 8.0* 7.8* 7.7* 8.2* 7.6* 7.4*  HCT 27.0* 26.8* 26.5* 24.0* 26.7* 25.4*  MCV 96.1 96.8 96.7  --  98.9 100.8*  PLT 140* 144* 168  --  190 [id number]   Basic Metabolic Panel:  Recent Labs Lab 01/16/16 0515 01/17/16 0748 01/17/16 0953 01/17/16 0954 01/17/16 1000 01/18/16 0455 01/19/16 0333  NA 138 139  139 137 137 137 139 137  K 3.6 4.2  4.5 3.8 3.8 3.8 4.1  4.7  CL 101 104  105 102 102  --  102 101  CO2 29 24  22 25 25   --  28 26  GLUCOSE 86 80  79 86 86 87 91 76  BUN 16 32*  32* 30* 30*  --  15 23*  CREATININE 3.55* 5.21*  5.30* 5.60* 5.60*  --  3.72* 5.74*  CALCIUM 7.7* 8.3*  8.4* 8.2* 8.2*  --  7.7* 8.2*  PHOS 4.9* 7.1* 7.0*  --   --  4.4 6.0*   GFR: Estimated Creatinine Clearance: 15.7 mL/min (by C-G formula based on Cr of 5.74). Liver Function Tests:  Recent Labs Lab 01/16/16 0515 01/17/16 0748 01/17/16 0953 01/18/16 0455 01/19/16 0333  ALBUMIN 1.4* 1.4* 1.4* 1.5* 1.4*   No results for input(s): LIPASE, AMYLASE in the last 168 hours. No results for input(s): AMMONIA in the last 168 hours. Coagulation Profile:  Recent Labs Lab 01/15/16 0426 01/17/16 0953  INR 0.94 0.89   Cardiac Enzymes: No results for input(s): CKTOTAL, CKMB, CKMBINDEX, TROPONINI in the last 168 hours. BNP (last 3 results) No results for input(s): PROBNP in the last 8760 hours. HbA1C: No results for input(s): HGBA1C in the last 72 hours. CBG: No results for input(s): GLUCAP in the last 168 hours. Lipid Profile: No results for input(s): CHOL, HDL, LDLCALC, TRIG, CHOLHDL, LDLDIRECT in the last 72 hours. Thyroid Function Tests: No results for input(s): TSH, T4TOTAL, FREET4, T3FREE, THYROIDAB in the last 72 hours. Anemia Panel: No results for input(s): VITAMINB12, FOLATE, FERRITIN, TIBC, IRON, RETICCTPCT in the last 72 hours. Urine analysis:    Component Value Date/Time   COLORURINE YELLOW 10/14/2015 1604   APPEARANCEUR CLOUDY* 10/14/2015 1604   LABSPEC 1.027 10/14/2015 1604   PHURINE 5.5 10/14/2015 [place] 10/14/2015 1604   HGBUR NEGATIVE 10/14/2015 [place] 10/14/2015 [place] 10/14/2015 1604   PROTEINUR >300* 10/14/2015 1604   NITRITE NEGATIVE 10/14/2015 1604   LEUKOCYTESUR NEGATIVE 10/14/2015 1604     No results found for this or any previous visit (from the past 240 hour(s)).     Anti-infectives    Start     Dose/Rate Route Frequency Ordered Stop   01/17/16 1300  cefUROXime (ZINACEF) 1.5 g in dextrose 5 % 50 mL IVPB     1.5 g 100 mL/hr over 30 Minutes Intravenous To ShortStay Surgical 01/16/16 1237 01/17/16 1039   01/15/16 0600  cefUROXime (ZINACEF) 1.5 g in dextrose 5 % 50 mL IVPB     1.5 g 100 mL/hr over 30 Minutes Intravenous On call to O.R. 01/14/16 1932 01/15/16 1125   01/15/16 0600  cefUROXime (ZINACEF) 1.5 g in dextrose 5 % 50 mL IVPB  Status:  Discontinued     1.5 g 100 mL/hr over 30 Minutes Intravenous On call to O.R.  01/14/16 1932 01/14/16 1934   01/08/16 1200  cefUROXime (ZINACEF) 1.5 g in dextrose 5 % 50 mL IVPB     1.5 g 100 mL/hr over 30 Minutes Intravenous To Short Stay 01/07/16 1958 01/08/16 1245   01/08/16 0000  ceFAZolin (ANCEF) IVPB 1 g/50 mL premix  Status:  Discontinued    Comments:  Send with pt to OR   1 g 100 mL/hr over 30 Minutes Intravenous On call 01/07/16 1958 01/07/16 2010       Radiology Studies: No results found.      Scheduled Meds: . sodium chloride   Intravenous Once  . [START ON 01/26/2016] darbepoetin (ARANESP) injection - DIALYSIS  100 mcg Intravenous Q Fri-HD  . darbepoetin (ARANESP) injection - DIALYSIS  100 mcg Subcutaneous Once  . febuxostat  40 mg Oral Daily  . guaiFENesin  1,200 mg Oral BID  . heparin  5,000 Units Subcutaneous 3 times per day  . hydrALAZINE  75 mg Oral Q8H  . ibuprofen  400 mg Oral Once  . labetalol  300 mg Oral BID  . pantoprazole  40 mg Oral Daily  . polyethylene glycol  34 g Oral Daily  . predniSONE  2.5 mg Oral Q breakfast  . senna-docusate  2 tablet Oral BID  . sevelamer carbonate  1,600 mg Oral TID WC  . sodium chloride flush  3 mL Intravenous Q12H   Continuous Infusions: . sodium chloride 10 mL/hr at 01/17/16 1003  . sodium chloride       LOS: 14 days    Time spent: 20 min    Dailynn Nancarrow, MD [facility] Pager [phone]  If 7PM-7AM, please  contact night-coverage [email or web address] Password [facility] 01/19/2016, 5:39 PM

## 2016-01-19 NOTE — Progress Notes (Signed)
[facility] ROUNDING NOTE   Subjective:   Interval History:  Dialysis dependent nephrotic syndrome.  Doing ok this am.  Still has pain in LUE.  Objective:  Vital signs in last 24 hours:  Temp:  [99 F (37.2 C)-100.4 F (38 C)] 99.3 F (37.4 C) (04/28 1220) Pulse Rate:  [88-98] 92 (04/28 1220) Resp:  [15-21] 16 (04/28 1220) BP: (142-200)/(62-93) 184/89 mmHg (04/28 1220) SpO2:  [90 %-99 %] 90 % (04/28 0930) Weight:  [216 lb (97.977 kg)-216 lb 7.9 oz (98.2 kg)] 216 lb 7.9 oz (98.2 kg) (04/28 0827)  Weight change: -3 lb 5.7 oz (-1.523 kg) Filed Weights   01/17/16 1619 01/18/16 2145 01/19/16 0827  Weight: 219 lb 5.7 oz (99.5 kg) 216 lb (97.977 kg) 216 lb 7.9 oz (98.2 kg)    Intake/Output: I/O last 3 completed shifts: In: 40 [P.O.:480; I.V.:3] Out: 2170 [Urine:170; Other:2000]   Intake/Output this shift:     Physical exam: Gen: awake, alert, NAD, getting HD HEENT: MMM Cardio: RRR Pulm: CTAB, normal WOB on room air Ext: compression hose in place, continues to have LE edema.  Sacral edema +1. Continued LUE with swelling and pain.   Basic Metabolic Panel:  Recent Labs Lab 01/16/16 0515 01/17/16 0748 01/17/16 0953 01/17/16 0954 01/17/16 1000 01/18/16 0455 01/19/16 0333  NA 138 139  139 137 137 137 139 137  K 3.6 4.2  4.5 3.8 3.8 3.8 4.1 4.7  CL 101 104  105 102 102  --  102 101  CO2 29 24  22 25 25   --  28 26  GLUCOSE 86 80  79 86 86 87 91 76  BUN 16 32*  32* 30* 30*  --  15 23*  CREATININE 3.55* 5.21*  5.30* 5.60* 5.60*  --  3.72* 5.74*  CALCIUM 7.7* 8.3*  8.4* 8.2* 8.2*  --  7.7* 8.2*  PHOS 4.9* 7.1* 7.0*  --   --  4.4 6.0*    Liver Function Tests:  Recent Labs Lab 01/16/16 0515 01/17/16 0748 01/17/16 0953 01/18/16 0455 01/19/16 0333  ALBUMIN 1.4* 1.4* 1.4* 1.5* 1.4*   No results for input(s): LIPASE, AMYLASE in the last 168 hours. No results for input(s): AMMONIA in the last 168 hours.  CBC:  Recent Labs Lab 01/15/16 0426  01/16/16 0515 01/17/16 0953 01/17/16 1000 01/18/16 0455 01/19/16 0918  WBC 8.8 8.6 8.3  --  8.8 10.3  NEUTROABS  --   --   --   --   --  6.5  HGB 8.0* 7.8* 7.7* 8.2* 7.6* 7.4*  HCT 27.0* 26.8* 26.5* 24.0* 26.7* 25.4*  MCV 96.1 96.8 96.7  --  98.9 100.8*  PLT 140* 144* 168  --  190 212    Cardiac Enzymes: No results for input(s): CKTOTAL, CKMB, CKMBINDEX, TROPONINI in the last 168 hours.  BNP: Invalid input(s): POCBNP  CBG: No results for input(s): GLUCAP in the last 168 hours.  Microbiology: Results for orders placed or performed during the hospital encounter of 01/05/16  Surgical pcr screen     Status: Abnormal   Collection Time: 01/07/16 10:08 PM  Result Value Ref Range Status   MRSA, PCR NEGATIVE NEGATIVE Final   Staphylococcus aureus POSITIVE (A) NEGATIVE Final    Comment:        The Xpert SA Assay (FDA approved for NASAL specimens in patients over 72 years of age), is one component of a comprehensive surveillance program.  Test performance has been validated by [facility]  for patients greater than or equal to 82 year old. It is not intended to diagnose infection nor to guide or monitor treatment.     Coagulation Studies:  Recent Labs  01/17/16 0953  LABPROT 12.2  INR 0.89    Urinalysis: No results for input(s): COLORURINE, LABSPEC, PHURINE, GLUCOSEU, HGBUR, BILIRUBINUR, KETONESUR, PROTEINUR, UROBILINOGEN, NITRITE, LEUKOCYTESUR in the last 72 hours.  Invalid input(s): APPERANCEUR    Imaging: No results found.   Medications:   . sodium chloride 10 mL/hr at 01/17/16 1003  . sodium chloride     . sodium chloride   Intravenous Once  . [START ON 01/20/2016] darbepoetin (ARANESP) injection - DIALYSIS  100 mcg Intravenous Q Sat-HD  . febuxostat  40 mg Oral Daily  . heparin  5,000 Units Subcutaneous 3 times per day  . hydrALAZINE  50 mg Oral 3 times per day  . labetalol  200 mg Oral BID  . predniSONE  2.5 mg Oral Q breakfast  . sevelamer  carbonate  1,600 mg Oral TID WC  . sodium chloride flush  3 mL Intravenous Q12H   acetaminophen, hydrALAZINE, ondansetron **OR** ondansetron (ZOFRAN) IV, oxyCODONE, promethazine  Assessment/ Plan:   Assessment/Plan: 40 year old black female with a prolonged course with FSG that's been refractory to treatment now presenting with worsening volume status in the setting of weaning steroids and also uremic symptoms that will necessitate initiation of dialysis   1. 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  R IJ HD catheter placement 4/17. s/p RUE brachiocephalic fistula [id number]. UOP 50cc/ 24h.  K stable this am 4.7.  Cr 5.74.  Phos 6.0 today.  Renvela added. 2. Hypertension/volume - BP elevated. She is on labetalol and scheduled hydralazine. Continue fluid removal. 3. Pains in her legs- On uloric 4. Anemia - Hgb  7.4 getting ferric gluconate daliy 5. Bones- last PTH 132 as an OP- phos rose from 4.4 to 6 so renvela 1600mg  qac started. 6. LUE pain - likely secondary to recent surgical procedure.  Superficial thrombosis noted in the left cephalic vein, at the [facility] level.    Ok to [employer] from our standpoint.  Discharge on Renvela.  Patient set up for HD at [facility] MWF 2nd shift.   LOS: [place], DO 01/19/16 @12 :24 PM   I have seen and examined this patient and agree with plan as outlined by Dr. Lajuana Ripple.  For discharge today after HD and f/u with [facility] on Monday.  Will cont with prednisone taper as an outpatient and started on renvela due to the development of hyperphosphatemia. Governor Rooks Terecia Plaut,MD 01/19/2016 4:46 PM

## 2016-01-20 ENCOUNTER — Inpatient Hospital Stay (HOSPITAL_COMMUNITY): Payer: Medicaid Other

## 2016-01-20 LAB — TYPE AND SCREEN
ABO/RH(D): A NEG
ANTIBODY SCREEN: NEGATIVE
Unit division: 0

## 2016-01-20 LAB — CBC
HEMATOCRIT: 27.6 % — AB (ref 36.0–46.0)
Hemoglobin: 8.3 g/dL — ABNORMAL LOW (ref 12.0–15.0)
MCH: 29.3 pg (ref 26.0–34.0)
MCHC: 30.1 g/dL (ref 30.0–36.0)
MCV: 97.5 fL (ref 78.0–100.0)
Platelets: 236 10*3/uL (ref 150–400)
RBC: 2.83 MIL/uL — ABNORMAL LOW (ref 3.87–5.11)
RDW: 18.1 % — AB (ref 11.5–15.5)
WBC: 9.8 10*3/uL (ref 4.0–10.5)

## 2016-01-20 LAB — RENAL FUNCTION PANEL
Albumin: 1.3 g/dL — ABNORMAL LOW (ref 3.5–5.0)
Anion gap: 9 (ref 5–15)
BUN: 15 mg/dL (ref 6–20)
CHLORIDE: 100 mmol/L — AB (ref 101–111)
CO2: 28 mmol/L (ref 22–32)
Calcium: 8.3 mg/dL — ABNORMAL LOW (ref 8.9–10.3)
Creatinine, Ser: 4.55 mg/dL — ABNORMAL HIGH (ref 0.44–1.00)
GFR calc Af Amer: 13 mL/min — ABNORMAL LOW (ref >60)
GFR, EST NON AFRICAN AMERICAN: 11 mL/min — AB (ref >60)
Glucose, Bld: 79 mg/dL (ref 65–99)
POTASSIUM: 3.8 mmol/L (ref 3.5–5.1)
Phosphorus: 5.3 mg/dL — ABNORMAL HIGH (ref 2.5–4.6)
Sodium: 137 mmol/L (ref 135–145)

## 2016-01-20 MED ORDER — PIPERACILLIN-TAZOBACTAM IN DEX 2-0.25 GM/50ML IV SOLN
2.2500 g | Freq: Three times a day (TID) | INTRAVENOUS | Status: DC
  Administered 2016-01-20 – 2016-01-22 (×6): 2.25 g via INTRAVENOUS
  Filled 2016-01-20 (×9): qty 50

## 2016-01-20 MED ORDER — VANCOMYCIN HCL 10 G IV SOLR
1500.0000 mg | Freq: Once | INTRAVENOUS | Status: AC
  Administered 2016-01-20: 1500 mg via INTRAVENOUS
  Filled 2016-01-20: qty 1500

## 2016-01-20 MED ORDER — IOPAMIDOL (ISOVUE-370) INJECTION 76%
INTRAVENOUS | Status: AC
  Administered 2016-01-20: 100 mL via INTRAVENOUS
  Filled 2016-01-20: qty 100

## 2016-01-20 NOTE — Progress Notes (Signed)
Pharmacy Antibiotic Note  Sandy [name] is a 40 y.o. female admitted on 01/05/2016 with edema and shortness of breath 2/2 CKD. Pharmacy to dose vanc and zosyn for pending left arm CT for possible graft infection. Temp 100.4 on 4/28. CXR and cultures negative. WBC WNL. Plan to initiate iHD outpatient per nephro on MWF. No inpatient dialysis schedule as of 4/29.   Plan: -Vancomycin 1500mg  x1  -Zosyn 2.25g q8h -Follow up HD plans for further vancomycin dosing -Follow up CT results for therapy discontinuation -Monitor cultures, clinical progress  Height: 5\' 6"  (167.6 cm) Weight: 191 lb 5.8 oz (86.8 kg) IBW/kg (Calculated) : 59.3  Temp (24hrs), Avg:99.5 F (37.5 C), Min:98.9 F (37.2 C), Max:100 F (37.8 C)   Recent Labs Lab 01/16/16 0515  01/17/16 0953 01/17/16 0954 01/18/16 0455 01/19/16 0333 01/19/16 0918 01/20/16 0619 01/20/16 0912  WBC 8.6  --  8.3  --  8.8  --  10.3  --  9.8  CREATININE 3.55*  < > 5.60* 5.60* 3.72* 5.74*  --  4.55*  --   < > = values in this interval not displayed.  Estimated Creatinine Clearance: 18.8 mL/min (by C-G formula based on Cr of 4.55).    Allergies  Allergen Reactions  . Lisinopril Cough    Antimicrobials this admission: Cefuroxime surgical ppx Vanc 4/29>> Zosyn 4/29>>  Microbiology results: BCx 4/28: NGTD Surgical PCR screen: MRSA neg, Staph aureus Positive  Thank you for allowing pharmacy to be a part of this patient's care.  Stephens November, PharmD Clinical Pharmacy Resident 3:38 PM, 01/20/2016

## 2016-01-20 NOTE — Progress Notes (Signed)
PROGRESS NOTE    Sandy [name]  [id number] DOB: 05-22-1976 DOA: 01/05/2016 PCP: No PCP Per Patient   Outpatient Specialists:  Dr. Donnetta Hutching    Brief Narrative:  Patient admitted with anasarca, progression of CKD. She was started on HD. Her first HD was 4-17. Has been clipped and plan is for right arm hematemesis axis today by Dr. Donnetta Hutching, patient status post left brachial artery thrombectomy and vein patch angioplasty on 4/24, as well as right upper arm brachiocephalic fistula insertion on 4/26, continues to have pain in left upper extremity, venous Doppler negative for DVT, but significant for superficial thrombophlebitis.  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 - Seen by nephrology, hemodialysis managed by renal - Status post Left brachial artery thrombectomy and vein patch angioplasty -  right upper arm brachiocephalic fistula insertion on 4/26   Left upper extremity swelling, fever - patient with fever 100.4 on 4/28, blood cultures were sent, no growth to date, chest x-ray with no acute findings - As well fever can be related to thrombophlebitis - Dr. Bridgett Larsson evaluated patient today, will need to rule out infectious process and left upper extremity, CT left arm pending, will start on IV vancomycin and Zosyn empirically pending CT results  Metabolic acidosis - sodium bicarb to TID.   Atypical chest pain - troponin negative X3 - Echo EF 60 %  Anemia of chronic disease (renal failure) -transfuse for <7 - Received 1 unit PRBC 4/28 -defer  to renal  Accelerated HTN --IV hydralazine prn --Hydralazine, labetalol.  Remains uncontrolled, increase by mouth labetalol and hydralazine   DVT prophylaxis:  SQ Heparin  Code Status: Full   Family Communication: No family at bedside  Disposition Plan:  Home when stable   Consultants:   Nephrology  vascular   Procedures:  Left brachial artery thrombectomy and vein patch angioplasty 4/24 Right upper arm brachiocephalic fistula [id number]      Subjective: still having low-grade temperature, complaints of left upper  extremity pain  Objective: Filed Vitals:   01/19/16 1618 01/19/16 2107 01/20/16 0411 01/20/16 0841  BP: 129/59 139/81 144/62 155/73  Pulse: 87 95 84 84  Temp: 99.5 F (37.5 C) 100 F (37.8 C) 99.5 F (37.5 C) 98.9 F (37.2 C)  TempSrc: Oral Oral Oral Oral  Resp: 16 18 16 17   Height:      Weight:   86.8 kg (191 lb 5.8 oz)   SpO2: 96% 96% 98% 98%    Intake/Output Summary (Last 24 hours) at 01/20/16 1458 Last data filed at 01/20/16 1454  Gross per 24 hour  Intake    763 ml  Output      0 ml  Net    763 ml   Filed Weights   01/19/16 0827 01/19/16 1254 01/20/16 0411  Weight: 98.2 kg (216 lb 7.9 oz) 96.2 kg (212 lb 1.3 oz) 86.8 kg (191 lb 5.8 oz)    Examination:  General exam: Awake, communicative, appropriate Chest: Good air entry bilaterally, no wheezing or rhonchi Cardiovascular: rrr, no rubs or gallops Abdomen: Soft,  nontender, nondistended EXT: Pedal pulses felt bilaterally, L arm swelling    Data Reviewed: I have personally reviewed following labs and imaging studies  CBC:  Recent Labs Lab 01/16/16 0515 01/17/16 0953 01/17/16 1000 01/18/16 0455 01/19/16 0918 01/20/16 0912  WBC 8.6 8.3  --  8.8 10.3 9.8  NEUTROABS  --   --   --   --  6.5  --   HGB 7.8* 7.7* 8.2* 7.6* 7.4* 8.3*  HCT 26.8* 26.5* 24.0* 26.7* 25.4* 27.6*  MCV 96.8 96.7  --  98.9 100.8* 97.5  PLT 144* 168  --  190 212 [id number]   Basic Metabolic Panel:  Recent  Labs Lab 01/17/16 0748 01/17/16 0953 01/17/16 0954 01/17/16 1000 01/18/16 0455 01/19/16 0333 01/20/16 0619  NA 139  139 137 137 137 139 137 137  K 4.2  4.5 3.8 3.8 3.8 4.1 4.7 3.8  CL 104  105 102 102  --  102 101 100*  CO2 24  22 25 25   --  28 26 28   GLUCOSE 80  79 86 86 87 91 76 79  BUN 32*  32* 30* 30*  --  15 23* 15  CREATININE 5.21*  5.30* 5.60* 5.60*  --  3.72* 5.74* 4.55*  CALCIUM 8.3*  8.4* 8.2* 8.2*  --  7.7* 8.2* 8.3*  PHOS 7.1* 7.0*  --   --  4.4 6.0* 5.3*   GFR: Estimated Creatinine Clearance: 18.8 mL/min (by C-G formula based on Cr of 4.55). Liver Function Tests:  Recent Labs Lab 01/17/16 0748 01/17/16 0953 01/18/16 0455 01/19/16 0333 01/20/16 0619  ALBUMIN 1.4* 1.4* 1.5* 1.4* 1.3*   No results for input(s): LIPASE, AMYLASE in the last 168 hours. No results for input(s): AMMONIA in the last 168 hours. Coagulation Profile:  Recent Labs Lab 01/15/16 0426 01/17/16 0953  INR 0.94 0.89   Cardiac Enzymes: No results for input(s): CKTOTAL, CKMB, CKMBINDEX, TROPONINI in the last 168 hours. BNP (last 3 results) No results for input(s): PROBNP in the last 8760 hours. HbA1C: No results for input(s): HGBA1C in the last 72 hours. CBG: No results for input(s): GLUCAP in the last 168 hours. Lipid Profile: No results for input(s): CHOL, HDL, LDLCALC, TRIG, CHOLHDL, LDLDIRECT in the last 72 hours. Thyroid Function Tests: No results for input(s): TSH, T4TOTAL, FREET4, T3FREE, THYROIDAB in the last 72 hours. Anemia Panel: No results for input(s): VITAMINB12, FOLATE, FERRITIN, TIBC, IRON, RETICCTPCT in the last 72 hours. Urine analysis:    Component Value Date/Time   COLORURINE YELLOW 10/14/2015 1604   APPEARANCEUR CLOUDY* 10/14/2015 1604   LABSPEC 1.027 10/14/2015 1604   PHURINE 5.5 10/14/2015 [place] 10/14/2015 1604   HGBUR NEGATIVE 10/14/2015 [place] 10/14/2015 [place] 10/14/2015 1604    PROTEINUR >300* 10/14/2015 1604   NITRITE NEGATIVE 10/14/2015 1604   LEUKOCYTESUR NEGATIVE 10/14/2015 1604     Recent Results (from the past 240 hour(s))  Culture, blood (Routine X 2) w Reflex to ID Panel     Status: None (Preliminary result)   Collection Time: 01/19/16 10:20 AM  Result Value Ref Range Status   Specimen Description BLOOD  Final   Special Requests BOTTLES DRAWN AEROBIC AND ANAEROBIC 10CC  Final   Culture NO GROWTH < 24 HOURS  Final   Report Status PENDING  Incomplete      Anti-infectives    Start     Dose/Rate Route Frequency Ordered Stop   01/17/16 1300  cefUROXime (ZINACEF) 1.5 g in dextrose 5 % 50 mL IVPB     1.5 g 100 mL/hr over 30 Minutes Intravenous To ShortStay Surgical 01/16/16 1237 01/17/16 1039   01/15/16 0600  cefUROXime (ZINACEF) 1.5 g in dextrose 5 % 50 mL IVPB     1.5 g 100 mL/hr over 30 Minutes Intravenous On call to O.R. 01/14/16 1932 01/15/16 1125   01/15/16 0600  cefUROXime (ZINACEF) 1.5 g in dextrose 5 % 50 mL IVPB  Status:  Discontinued     1.5 g 100 mL/hr over 30 Minutes Intravenous On call to O.R. 01/14/16 1932 01/14/16 1934   01/08/16 1200  cefUROXime (ZINACEF) 1.5 g in dextrose 5 % 50 mL IVPB     1.5 g 100 mL/hr over 30 Minutes Intravenous To Short Stay 01/07/16 1958 01/08/16 1245   01/08/16 0000  ceFAZolin (ANCEF) IVPB 1 g/50 mL premix  Status:  Discontinued    Comments:  Send with pt to OR   1 g 100 mL/hr over 30 Minutes Intravenous On call 01/07/16 1958 01/07/16 2010       Radiology Studies: Dg Chest 2 View  01/19/2016  CLINICAL DATA:  Cough for a couple days EXAM: CHEST  2 VIEW COMPARISON:  01/05/2016 FINDINGS: Heart size and vascular pattern are normal. Lungs are clear. Central line on the right with tip into the right atrium. No pneumothorax. IMPRESSION: No active cardiopulmonary disease. Electronically Signed   By: Skipper Cliche M.D.   On: 01/19/2016 17:46        Scheduled Meds: . sodium chloride   Intravenous Once  .  [START ON 01/26/2016] darbepoetin (ARANESP) injection - DIALYSIS  100 mcg Intravenous Q Fri-HD  . febuxostat  40 mg Oral Daily  . guaiFENesin  1,200 mg Oral BID  . heparin  5,000 Units Subcutaneous 3 times per day  . hydrALAZINE  75 mg Oral Q8H  . ibuprofen  400 mg Oral Once  . labetalol  300 mg Oral BID  . pantoprazole  40 mg Oral Daily  . polyethylene glycol  34 g Oral Daily  . predniSONE  2.5 mg Oral Q breakfast  . senna-docusate  2 tablet Oral BID  . sevelamer carbonate  1,600 mg Oral TID WC  . sodium chloride flush  3 mL Intravenous Q12H   Continuous Infusions: . sodium chloride 10 mL/hr at 01/17/16 1003  . sodium chloride       LOS: 15 days    Time spent: 30 min    ELGERGAWY, DAWOOD, MD [facility] Pager [phone]  If 7PM-7AM, please contact night-coverage [email or web address] Password [facility] 01/20/2016, 2:58 PM

## 2016-01-20 NOTE — Evaluation (Signed)
Occupational Therapy Evaluation Patient Details Name: Sandy [name] MRN: [id number] DOB: [birth year] Today's Date: 01/20/2016    History of Present Illness Sandy [name] is a 40 y.o. 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 Found to have Patient admitted with anasarca, progression of CKD and was started on HD.  4/24  Left brachial artery thrombectomy and vein patch angioplasty--since this time LUE has become more swollen and painful, negative for DVT--positive for superficail thrombosus. 4/26 Right upper arm brachiocephalic fistula   Clinical Impression   This 40 yo female admitted and underwent above presents to acute OT with deficits below. Affecting her ability to care for herself at an independent level as she was pta. She will benefit from acute OT with follow up HHPT for her arm (since Medicaid cannot get HHOT).    Follow Up Recommendations  Other (comment) (HHPT for LUE--since cannot get HHOT due to Medicaid)    Equipment Recommendations  None recommended by OT       Precautions / Restrictions Precautions Precautions: None      Mobility Bed Mobility Overal bed mobility: Modified Independent                Transfers Overall transfer level: Independent                         ADL Overall ADL's : Needs assistance/impaired Eating/Feeding: Set up;Sitting   Grooming: Moderate assistance;Sitting Grooming Details (indicate cue type and reason): Needs A for all tasks that require 2 arms/hands Upper Body Bathing: Moderate assistance;Sitting   Lower Body Bathing: Sit to/from stand;Modified independent   Upper Body Dressing : Moderate assistance;Sitting;Standing Upper Body Dressing Details (indicate cue type and reason): edcuated pt on compensatory technique for dressing due to  decreased use of LUE Lower Body Dressing: Modified independent Lower Body Dressing Details (indicate cue type and reason): Can cross legs to get to feet and use one handed technique to doff and donn socks. Recommended that she use elastic waist pants Toilet Transfer: Independent   Toileting- Clothing Manipulation and Hygiene: Modified independent                         Pertinent Vitals/Pain Pain Assessment: 0-10 Pain Score: 8  Pain Location: LUE with movement Pain Descriptors / Indicators: Aching;Sore;Grimacing;Guarding;Pressure;Operative site guarding Pain Intervention(s): Limited activity within patient's tolerance;Repositioned     Hand Dominance Right   Extremity/Trunk Assessment Upper Extremity Assessment Upper Extremity Assessment: RUE deficits/detail;LUE deficits/detail RUE Deficits / Details: C/o pain in antecubital fossa the more I had her use her RUE to A her LUE; however WFL LUE Deficits / Details: Hypersensitive to touch, swollen more from elbow distally than proximally. Any movement hurts her. Only tolerating small PROM/AAROM/SROM of all joints of arm LUE Coordination: decreased fine motor;decreased gross motor           Communication Communication Communication: No difficulties   Cognition Arousal/Alertness: Awake/alert Behavior During Therapy: WFL for tasks assessed/performed Overall Cognitive Status: Within Functional Limits for tasks assessed                        Exercises   Other Exercises Other Exercises: Eduated pt to use, use, use her LUE as much as possible due to disuse will only  may it worse. I educated her on 4 exercises (10 reps 1 and then 2 hours after pain meds: open and close hand and count to 5 for both directions, rocking baby, arms crossed in rocking baby fashion and raising arms to shoulder height, clasping hands and reaching towards knees)--I told her these could be done in stting or laying down and she returned demonstrated  while sitting EOB.        Home Living Family/patient expects to be discharged to:: Private residence Living Arrangements: Alone                           Home Equipment: None          Prior Functioning/Environment Level of Independence: Independent             OT Diagnosis: Generalized weakness;Acute pain   OT Problem List: Decreased strength;Decreased range of motion;Impaired UE functional use;Pain   OT Treatment/Interventions: Self-care/ADL training;Patient/family education;Balance training;Therapeutic exercise;Therapeutic activities    OT Goals(Current goals can be found in the care plan section) Acute Rehab OT Goals Patient Stated Goal: to get pain in LUE under control and be able to use her LUE more OT Goal Formulation: With patient Time For Goal Achievement: 02/03/16 Potential to Achieve Goals: Good  OT Frequency: Min 3X/week   Barriers to D/C: Decreased caregiver support             End of Session Equipment Utilized During Treatment:  (none) Nurse Communication:  (Do not feel pt will be able to come home and take care of herself as she needs to until pain better controlled and pt using her arm more functionally)  Activity Tolerance: Patient limited by pain Patient left: in bed;with call bell/phone within reach   Time: 1100-1135 OT Time Calculation (min): 35 min Charges:  OT General Charges $OT Visit: 1 Procedure OT Evaluation $OT Eval Moderate Complexity: 1 Procedure OT Treatments $Therapeutic Exercise: 8-22 mins  Almon Register [id number] 01/20/2016, 1:01 PM

## 2016-01-20 NOTE — Progress Notes (Signed)
   Daily Progress Note  Assessment/Planning: POD #3 s/p R BC AVF POD #5 s/p L brachial artery TE w/ VPA   Exam is not consistent with a brachial hematoma  Arm is more diffusely tender than expect with just SVT.  The SVT is chronic as the cephalic was occluded when the fistula occluded  Would consider CT L arm to evaluate for abscess  Would consider empiric abx also  Subjective  - 3 Days Post-Op  C/o significant pain in L arm  Objective Filed Vitals:   01/19/16 1618 01/19/16 2107 01/20/16 0411 01/20/16 0841  BP: 129/59 139/81 144/62 155/73  Pulse: 87 95 84 84  Temp: 99.5 F (37.5 C) 100 F (37.8 C) 99.5 F (37.5 C) 98.9 F (37.2 C)  TempSrc: Oral Oral Oral Oral  Resp: 16 18 16 17   Height:      Weight:   191 lb 5.8 oz (86.8 kg)   SpO2: 96% 96% 98% 98%    Intake/Output Summary (Last 24 hours) at 01/20/16 1149 Last data filed at 01/20/16 [id number]  Gross per 24 hour  Intake    898 ml  Output   2515 ml  Net  -1617 ml   VASC  L elbow contracted, blanching erythema over elbow, no frank drainage from antecubital incision, forearm is swollen rather than upper arm  Laboratory CBC    Component Value Date/Time   WBC 9.8 01/20/2016 0912   HGB 8.3* 01/20/2016 0912   HCT 27.6* 01/20/2016 0912   PLT 236 01/20/2016 0912    BMET    Component Value Date/Time   NA 137 01/20/2016 0619   K 3.8 01/20/2016 0619   CL 100* 01/20/2016 0619   CO2 28 01/20/2016 0619   GLUCOSE 79 01/20/2016 0619   BUN 15 01/20/2016 0619   CREATININE 4.55* 01/20/2016 0619   CALCIUM 8.3* 01/20/2016 0619   GFRNONAA 11* 01/20/2016 0619   GFRAA 13* 01/20/2016 [id number]    Adele Barthel, MD [facility] Office: [phone] Pager: [phone]  01/20/2016, 11:49 AM

## 2016-01-21 ENCOUNTER — Inpatient Hospital Stay (HOSPITAL_COMMUNITY): Payer: Medicaid Other

## 2016-01-21 DIAGNOSIS — L03114 Cellulitis of left upper limb: Secondary | ICD-10-CM

## 2016-01-21 DIAGNOSIS — Z992 Dependence on renal dialysis: Secondary | ICD-10-CM

## 2016-01-21 DIAGNOSIS — N186 End stage renal disease: Secondary | ICD-10-CM

## 2016-01-21 LAB — HEPATIC FUNCTION PANEL
ALT: 37 U/L (ref 14–54)
AST: 14 U/L — AB (ref 15–41)
Albumin: 1.3 g/dL — ABNORMAL LOW (ref 3.5–5.0)
Alkaline Phosphatase: 266 U/L — ABNORMAL HIGH (ref 38–126)
Bilirubin, Direct: <0.1 mg/dL — ABNORMAL LOW (ref 0.1–0.5)
TOTAL PROTEIN: 5 g/dL — AB (ref 6.5–8.1)
Total Bilirubin: 0.7 mg/dL (ref 0.3–1.2)

## 2016-01-21 LAB — RENAL FUNCTION PANEL
ANION GAP: 11 (ref 5–15)
Albumin: 1.3 g/dL — ABNORMAL LOW (ref 3.5–5.0)
BUN: 25 mg/dL — AB (ref 6–20)
CALCIUM: 8.3 mg/dL — AB (ref 8.9–10.3)
CO2: 24 mmol/L (ref 22–32)
Chloride: 101 mmol/L (ref 101–111)
Creatinine, Ser: 5.85 mg/dL — ABNORMAL HIGH (ref 0.44–1.00)
GFR calc Af Amer: 10 mL/min — ABNORMAL LOW (ref >60)
GFR calc non Af Amer: 8 mL/min — ABNORMAL LOW (ref >60)
Glucose, Bld: 84 mg/dL (ref 65–99)
PHOSPHORUS: 6.6 mg/dL — AB (ref 2.5–4.6)
POTASSIUM: 4.1 mmol/L (ref 3.5–5.1)
Sodium: 136 mmol/L (ref 135–145)

## 2016-01-21 NOTE — Progress Notes (Signed)
Occupational Therapy Treatment Patient Details Name: Sandy [name] MRN: [id number] DOB: June 12, 1976 Today's Date: 01/21/2016    History of present illness Sandy [name] is a 40 y.o. 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 Found to have Patient admitted with anasarca, progression of CKD and was started on HD.  4/24  Left brachial artery thrombectomy and vein patch angioplasty--since this time LUE has become more swollen and painful, negative for DVT--positive for superficail thrombosus. 4/26 Right upper arm brachiocephalic fistula   OT comments  Pt required some encouragement to engage with therapist due to anticipated pain and guarding. OT demonstrating on patient R UE and having patient complete AAROM with L UE. Pt recalling information from previous session and reports incr motion. Pt limited elbow extension and digit AROM at this time.   Follow Up Recommendations  Other (comment) (HHPT for L UE)    Equipment Recommendations  None recommended by OT    Recommendations for Other Services      Precautions / Restrictions                         Praxis      Cognition   Behavior During Therapy: [facility] for tasks assessed/performed Overall Cognitive Status: Within Functional Limits for tasks assessed                       Extremity/Trunk Assessment               Exercises Other Exercises Other Exercises: Pt was able to demonstrate AAROM digits with limited MCP 20 degrees, edema noted throughout L UE. Pt able to complete shoulder flexion 40 degrees AAROM, Pt with elbow extension 80 degrees AAROM with reports of pain with attempts at full extension Other Exercises: pt able to verbalize exercises schedule. Pt educated again on elevation and positioning Other Exercises: educated on retro  grade massage at digits to help with edema and within tolerance   Shoulder Instructions       General Comments      Pertinent Vitals/ Pain       Pain Assessment: Faces Faces Pain Scale: Hurts whole lot Pain Location: L UE "i think my medication might be coming due" Pain Descriptors / Indicators: Guarding Pain Intervention(s): Limited activity within patient's tolerance;Monitored during session;Repositioned  Home Living                                          Prior Functioning/Environment              Frequency Min 3X/week     Progress Toward Goals  OT Goals(current goals can now be found in the care plan section)  Progress towards OT goals: Progressing toward goals  Acute Rehab OT Goals Patient Stated Goal: to give my arm a few more days then it will be better OT Goal Formulation: With patient Time For Goal Achievement: 02/03/16 Potential to Achieve Goals: Good ADL Goals Pt Will Perform Grooming: with min assist;standing Pt Will Perform Upper Body Bathing: with min assist;standing;sitting Pt Will Perform Upper Body Dressing: with min assist;sitting;standing Pt/caregiver will Perform Home Exercise Program: Increased ROM;Increased strength;Left upper extremity;Independently;With written HEP provided  Plan Discharge plan  remains appropriate    Co-evaluation                 End of Session     Activity Tolerance Patient limited by pain   Patient Left in bed;with call bell/phone within reach   Nurse Communication Mobility status;Precautions        Time: 1430-1443 OT Time Calculation (min): 13 min  Charges: OT General Charges $OT Visit: 1 Procedure OT Treatments $Therapeutic Exercise: 8-22 mins  Parke Poisson B 01/21/2016, 3:17 PM   Sandy [name]   OTR/L Pager: [phone] Office: [phone] .

## 2016-01-21 NOTE — Progress Notes (Addendum)
[facility] ROUNDING NOTE   Subjective:   Interval History:  Dialysis dependent nephrotic syndrome.  Doing ok this am.  Still has pain in LUE.  Objective:  Vital signs in last 24 hours:  Temp:  [98.9 F (37.2 C)-99.8 F (37.7 C)] 99.1 F (37.3 C) (04/30 0828) Pulse Rate:  [75-86] 83 (04/30 0828) Resp:  [17-20] 17 (04/30 0828) BP: (143-158)/(59-79) 143/69 mmHg (04/30 0828) SpO2:  [97 %-98 %] 98 % (04/30 0828)  Weight change:  Filed Weights   01/19/16 0827 01/19/16 1254 01/20/16 0411  Weight: 216 lb 7.9 oz (98.2 kg) 212 lb 1.3 oz (96.2 kg) 191 lb 5.8 oz (86.8 kg)    Intake/Output: I/O last 3 completed shifts: In: 723 [P.O.:720; I.V.:3] Out: 0    Intake/Output this shift:  Total I/O In: 480 [P.O.:480] Out: 0   Physical exam: Gen: awake, alert, NAD  HEENT: MMM Cardio: RRR Pulm: CTAB, normal WOB on room air Ext: compression hose in place, continues to have LE edema.  Sacral edema +1. Continued LUE with swelling and pain.   Basic Metabolic Panel:  Recent Labs Lab 01/17/16 0953 01/17/16 0954 01/17/16 1000 01/18/16 0455 01/19/16 0333 01/20/16 0619 01/21/16 0551  NA 137 137 137 139 137 137 136  K 3.8 3.8 3.8 4.1 4.7 3.8 4.1  CL 102 102  --  102 101 100* 101  CO2 25 25  --  28 26 28 24   GLUCOSE 86 86 87 91 76 79 84  BUN 30* 30*  --  15 23* 15 25*  CREATININE 5.60* 5.60*  --  3.72* 5.74* 4.55* 5.85*  CALCIUM 8.2* 8.2*  --  7.7* 8.2* 8.3* 8.3*  PHOS 7.0*  --   --  4.4 6.0* 5.3* 6.6*    Liver Function Tests:  Recent Labs Lab 01/17/16 0953 01/18/16 0455 01/19/16 0333 01/20/16 0619 01/21/16 0551  AST  --   --   --   --  14*  ALT  --   --   --   --  37  ALKPHOS  --   --   --   --  266*  BILITOT  --   --   --   --  0.7  PROT  --   --   --   --  5.0*  ALBUMIN 1.4* 1.5* 1.4* 1.3* 1.3*  1.3*   No results for input(s): LIPASE, AMYLASE in the last 168 hours. No results for input(s): AMMONIA in the last 168 hours.  CBC:  Recent Labs Lab  01/16/16 0515 01/17/16 0953 01/17/16 1000 01/18/16 0455 01/19/16 0918 01/20/16 0912  WBC 8.6 8.3  --  8.8 10.3 9.8  NEUTROABS  --   --   --   --  6.5  --   HGB 7.8* 7.7* 8.2* 7.6* 7.4* 8.3*  HCT 26.8* 26.5* 24.0* 26.7* 25.4* 27.6*  MCV 96.8 96.7  --  98.9 100.8* 97.5  PLT 144* 168  --  190 212 236    Cardiac Enzymes: No results for input(s): CKTOTAL, CKMB, CKMBINDEX, TROPONINI in the last 168 hours.  BNP: Invalid input(s): POCBNP  CBG: No results for input(s): GLUCAP in the last 168 hours.  Microbiology: Results for orders placed or performed during the hospital encounter of 01/05/16  Surgical pcr screen     Status: Abnormal   Collection Time: 01/07/16 10:08 PM  Result Value Ref Range Status   MRSA, PCR NEGATIVE NEGATIVE Final   Staphylococcus aureus POSITIVE (A) NEGATIVE Final  Comment:        The Xpert SA Assay (FDA approved for NASAL specimens in patients over 41 years of age), is one component of a comprehensive surveillance program.  Test performance has been validated by [facility] for patients greater than or equal to 57 year old. It is not intended to diagnose infection nor to guide or monitor treatment.   Culture, blood (Routine X 2) w Reflex to ID Panel     Status: None (Preliminary result)   Collection Time: 01/19/16  9:00 AM  Result Value Ref Range Status   Specimen Description BLOOD HEMODIALYSIS CATHETER  Final   Special Requests BOTTLES DRAWN AEROBIC AND ANAEROBIC 10CC  Final   Culture NO GROWTH 1 DAY  Final   Report Status PENDING  Incomplete  Culture, blood (Routine X 2) w Reflex to ID Panel     Status: None (Preliminary result)   Collection Time: 01/19/16 10:20 AM  Result Value Ref Range Status   Specimen Description BLOOD  Final   Special Requests BOTTLES DRAWN AEROBIC AND ANAEROBIC 10CC  Final   Culture NO GROWTH 2 DAYS  Final   Report Status PENDING  Incomplete    Coagulation Studies: No results for input(s): LABPROT, INR in the last  72 hours.  Urinalysis: No results for input(s): COLORURINE, LABSPEC, PHURINE, GLUCOSEU, HGBUR, BILIRUBINUR, KETONESUR, PROTEINUR, UROBILINOGEN, NITRITE, LEUKOCYTESUR in the last 72 hours.  Invalid input(s): APPERANCEUR    Imaging: Dg Chest 2 View  01/19/2016  CLINICAL DATA:  Cough for a couple days EXAM: CHEST  2 VIEW COMPARISON:  01/05/2016 FINDINGS: Heart size and vascular pattern are normal. Lungs are clear. Central line on the right with tip into the right atrium. No pneumothorax. IMPRESSION: No active cardiopulmonary disease. Electronically Signed   By: Skipper Cliche M.D.   On: 01/19/2016 17:46   US Abdomen Limited Ruq  01/21/2016  CLINICAL DATA:  40 year old female with history of liver lesion noted on recent CT examination. EXAM: US ABDOMEN LIMITED - RIGHT UPPER QUADRANT COMPARISON:  No priors. FINDINGS: Gallbladder: No gallstones or wall thickening visualized. Nearly completely decompressed. No sonographic Murphy sign noted by sonographer. Common bile duct: Diameter: 11 mm in the porta hepatis. Liver: There are 3 echogenic lesions are noted in the liver, the largest of which is in the right lobe of the liver measuring 2.8 x 2.6 x 3.2 cm. No associated posterior acoustic shadowing. Hepatic echogenicity is within normal limits. IMPRESSION: 1. There is common bile duct dilatation measuring up to 11 mm in the porta hepatis. There does not appear to be significant intrahepatic biliary ductal dilatation, however, the presence of a dilated common bile duct raises concern for biliary obstruction. Although there are no gallstones, and no definite ductal stone identified in the visualized common bile duct, the possibility of distal obstruction should be considered. This could be better evaluated with followup MRI of the abdomen with and without IV gadolinium with MRCP if clinically appropriate. 2. 3 liver lesions have imaging characteristics most suggestive of cavernous hemangiomas. This too could be  confirmed with followup MRI of the abdomen with and without IV gadolinium if of clinical concern. Electronically Signed   By: Vinnie Langton M.D.   On: 01/21/2016 11:08   Ct Extrem Up Entire Arm L W/cm  01/20/2016  CLINICAL DATA:  Acute onset of left arm erythema and edema, with shortness of breath. Assess for graft infection or abscess. Recent thrombectomy at the left brachial artery. Initial encounter. EXAM: CT OF  THE UPPER LEFT EXTREMITY WITH CONTRAST TECHNIQUE: Multidetector CT imaging of the left upper extremity was performed according to the standard protocol following intravenous contrast administration. COMPARISON:  None. CONTRAST:  100 mL of Isovue 370 IV contrast FINDINGS: The patient's arteriovenous fistula does not appear to opacify with contrast, raising suspicion for an occluded fistula. However, evaluation is significantly limited due to artifact and the large field-of-view. A small amount of contrast is seen opacifying the cephalic vein, proximal to the fistula. No focal abscess is seen. Mild vague soft tissue inflammation is noted about the fistula and tracking proximally along the left upper arm, which could reflect underlying infection. No elbow joint effusion is identified. The visualized osseous structures are grossly unremarkable. The lungs appear clear bilaterally, aside from minimal left basilar atelectasis. The right-sided hemodialysis catheter is noted. The thyroid gland is unremarkable. No axillary lymphadenopathy is seen. A 3.7 cm focal hypodensity is noted at the right hepatic lobe. The visualized portions of the spleen, pancreas and adrenal glands are within normal limits. No acute osseous abnormalities are seen. IMPRESSION: 1. Arteriovenous fistula does not appear to opacify with contrast, raising suspicion for underlying thrombus. However, evaluation is significantly limited due to artifact and the large field of view. Patency of the fistula could be further assessed on Doppler  ultrasound, as deemed clinically appropriate. 2. No evidence of abscess. Mild vague soft tissue inflammation about the fistula and tracking proximally along the left upper arm, which could reflect mild infection, depending on the patient's symptoms. 3. 3.7 cm focal hypodensity at the right hepatic lobe. Would correlate with LFTs and evaluate further as deemed clinically appropriate. Electronically Signed   By: Garald Balding M.D.   On: 01/20/2016 23:45     Medications:   . sodium chloride 10 mL/hr at 01/17/16 1003  . sodium chloride     . sodium chloride   Intravenous Once  . [START ON 01/26/2016] darbepoetin (ARANESP) injection - DIALYSIS  100 mcg Intravenous Q Fri-HD  . febuxostat  40 mg Oral Daily  . guaiFENesin  1,200 mg Oral BID  . heparin  5,000 Units Subcutaneous 3 times per day  . hydrALAZINE  75 mg Oral Q8H  . ibuprofen  400 mg Oral Once  . labetalol  300 mg Oral BID  . pantoprazole  40 mg Oral Daily  . piperacillin-tazobactam (ZOSYN)  IV  2.25 g Intravenous Q8H  . polyethylene glycol  34 g Oral Daily  . predniSONE  2.5 mg Oral Q breakfast  . senna-docusate  2 tablet Oral BID  . sevelamer carbonate  1,600 mg Oral TID WC  . sodium chloride flush  3 mL Intravenous Q12H   acetaminophen, hydrALAZINE, ondansetron **OR** ondansetron (ZOFRAN) IV, oxyCODONE, promethazine  Assessment/ Plan:   Assessment/Plan: 40 year old black female with a prolonged course with FSG that's been refractory to treatment now presenting with worsening volume status in the setting of weaning steroids and also uremic symptoms that will necessitate initiation of dialysis   1. 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  R IJ HD catheter placement 4/17. s/p RUE brachiocephalic fistula [id number]. UOP 0cc/ 24h.  K stable 4.1.  Cr 5.85.  Phos 6.6 today. Renvela added 4/29. Continue prednisone taper.  Will likely need this upon DC. Patient set up for HD at [facility]  MWF 2nd shift. 2. Hypertension/volume - BP elevated. She is on labetalol and scheduled hydralazine. Continue fluid removal. 3. Pains in her legs- On uloric  4. Anemia - Hgb  8.3 on 4/29;  getting ferric gluconate daliy 5. Bones- last PTH 132 as an OP- phos rose from 4.4 to 6 so renvela 1600mg  qac started. 6. LUE pain - Superficial thrombosis noted in the left cephalic vein, at the [facility] level. CT showed possible cellulitis. On Vanc/Zosyn per primary.    Elberta Leatherwood, MD,MS,  PGY2 01/21/2016 2:53 PM  LOS: 16  I have seen and examined this patient and agree with plan as outlined by Dr. Alease Frame. Will plan for HD tomorrow if she remains an inpatient.  Plan is to continue with empiric vancomycin per VVS and primary svc, 750mg  with HD tiw for 2 weeks.  Blood cultures no growh to date.  Hopefully will be stable for discharge to home.  Also continue taper prednisone over the next 2 weeks. Governor Rooks Cheo Selvey,MD 01/21/2016 3:39 PM

## 2016-01-21 NOTE — Progress Notes (Signed)
PROGRESS NOTE    Sandy [name]  [id number] DOB: Nov 15, 1975 DOA: 01/05/2016 PCP: No PCP Per Patient   Outpatient Specialists:  Dr. Donnetta Hutching    Brief Narrative:  Patient admitted with anasarca, progression of CKD. She was started on HD. Her first HD was 4-17. Has been clipped and plan is for right arm hematemesis axis today by Dr. Donnetta Hutching, patient status post left brachial artery thrombectomy and vein patch angioplasty on 4/24, as well as right upper arm brachiocephalic fistula insertion on 4/26, continues to have pain in left upper extremity, venous Doppler negative for DVT, but significant for superficial thrombophlebitis, evaluated by vascular surgery, left arm CT with contrast with no evidence of abscess, finding most likely related to cellulitis, improving on IV antibiotics  Assessment & Plan:   Principal Problem:   Hypervolemia Active Problems:   Anasarca associated with disorder of kidney   CKD (chronic kidney disease), stage IV (HCC)   Accelerated hypertension   Atypical chest pain   Volume overload   Anasarca, nephrotic syndrome, FSGS, 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 - Seen by nephrology, hemodialysis managed by renal - Status post Left brachial artery thrombectomy and vein patch angioplasty -  right upper arm brachiocephalic fistula insertion on 4/26   Left upper extremity swelling cellulitis - patient with fever 100.4 on 4/28, blood cultures were sent, no growth to date, chest x-ray with no acute findings - As well fever can be related to thrombophlebitis - Vascular surgery input appreciated, CT left upper extremity with IV contrast does not demonstrate any fluid pockets, finding most likely related to cellulitis - Continue with IV Zosyn and vancomycin, hopefully can be discharged tomorrow on IV  Zosyn as an outpatient during dialysis  Metabolic acidosis - sodium bicarb to TID.   Atypical chest pain - troponin negative X3 - Echo EF 60 %  Anemia of chronic disease (renal failure) -transfuse for <7 - Received 1 unit PRBC 4/28 -defer to renal  Accelerated HTN --IV hydralazine prn --Hydralazine, labetalol.  Remains uncontrolled, increase by mouth labetalol and hydralazine   DVT prophylaxis:  SQ Heparin  Code Status: Full   Family Communication: No family at bedside  Disposition Plan:  Home when stable   Consultants:   Nephrology  vascular   Procedures:  Left brachial artery thrombectomy and vein patch angioplasty 4/24 Right upper arm brachiocephalic fistula [id number]      Subjective: Afebrile , complaints of left upper  extremity pain  Objective: Filed Vitals:   01/20/16 1706 01/20/16 2022 01/21/16 0507 01/21/16 0828  BP: 143/59 158/79 153/71 143/69  Pulse: 80 86 75 83  Temp: 98.9 F (37.2 C) 99.8 F (37.7 C) 98.9 F (37.2 C) 99.1 F (37.3 C)  TempSrc: Oral   Oral  Resp: 18 20 18 17   Height:      Weight:      SpO2: 97% 97% 98% 98%    Intake/Output Summary (Last 24 hours) at 01/21/16 1632 Last data filed at 01/21/16 1408  Gross per 24 hour  Intake    720 ml  Output      0 ml  Net    720 ml   Filed Weights   01/19/16 0827 01/19/16 1254 01/20/16 0411  Weight: 98.2 kg (216 lb 7.9 oz) 96.2 kg (212 lb 1.3 oz) 86.8 kg (191 lb  5.8 oz)    Examination:  General exam: Awake, communicative, appropriate Chest: Good air entry bilaterally, no wheezing or rhonchi Cardiovascular: rrr, no rubs or gallops Abdomen: Soft, nontender, nondistended EXT: Pedal pulses felt bilaterally, L arm swelling    Data Reviewed: I have personally reviewed following labs and imaging studies  CBC:  Recent Labs Lab 01/16/16 0515 01/17/16 0953 01/17/16 1000 01/18/16 0455 01/19/16 0918 01/20/16 0912  WBC 8.6 8.3  --  8.8 10.3 9.8  NEUTROABS  --   --   --    --  6.5  --   HGB 7.8* 7.7* 8.2* 7.6* 7.4* 8.3*  HCT 26.8* 26.5* 24.0* 26.7* 25.4* 27.6*  MCV 96.8 96.7  --  98.9 100.8* 97.5  PLT 144* 168  --  190 212 [id number]   Basic Metabolic Panel:  Recent Labs Lab 01/17/16 0953 01/17/16 0954 01/17/16 1000 01/18/16 0455 01/19/16 0333 01/20/16 0619 01/21/16 0551  NA 137 137 137 139 137 137 136  K 3.8 3.8 3.8 4.1 4.7 3.8 4.1  CL 102 102  --  102 101 100* 101  CO2 25 25  --  28 26 28 24   GLUCOSE 86 86 87 91 76 79 84  BUN 30* 30*  --  15 23* 15 25*  CREATININE 5.60* 5.60*  --  3.72* 5.74* 4.55* 5.85*  CALCIUM 8.2* 8.2*  --  7.7* 8.2* 8.3* 8.3*  PHOS 7.0*  --   --  4.4 6.0* 5.3* 6.6*   GFR: Estimated Creatinine Clearance: 14.6 mL/min (by C-G formula based on Cr of 5.85). Liver Function Tests:  Recent Labs Lab 01/17/16 0953 01/18/16 0455 01/19/16 0333 01/20/16 0619 01/21/16 0551  AST  --   --   --   --  14*  ALT  --   --   --   --  37  ALKPHOS  --   --   --   --  266*  BILITOT  --   --   --   --  0.7  PROT  --   --   --   --  5.0*  ALBUMIN 1.4* 1.5* 1.4* 1.3* 1.3*  1.3*   No results for input(s): LIPASE, AMYLASE in the last 168 hours. No results for input(s): AMMONIA in the last 168 hours. Coagulation Profile:  Recent Labs Lab 01/15/16 0426 01/17/16 0953  INR 0.94 0.89   Cardiac Enzymes: No results for input(s): CKTOTAL, CKMB, CKMBINDEX, TROPONINI in the last 168 hours. BNP (last 3 results) No results for input(s): PROBNP in the last 8760 hours. HbA1C: No results for input(s): HGBA1C in the last 72 hours. CBG: No results for input(s): GLUCAP in the last 168 hours. Lipid Profile: No results for input(s): CHOL, HDL, LDLCALC, TRIG, CHOLHDL, LDLDIRECT in the last 72 hours. Thyroid Function Tests: No results for input(s): TSH, T4TOTAL, FREET4, T3FREE, THYROIDAB in the last 72 hours. Anemia Panel: No results for input(s): VITAMINB12, FOLATE, FERRITIN, TIBC, IRON, RETICCTPCT in the last 72 hours. Urine analysis:      Component Value Date/Time   COLORURINE YELLOW 10/14/2015 1604   APPEARANCEUR CLOUDY* 10/14/2015 1604   LABSPEC 1.027 10/14/2015 1604   PHURINE 5.5 10/14/2015 1604   GLUCOSEU NEGATIVE 10/14/2015 1604   HGBUR NEGATIVE 10/14/2015 [place] 10/14/2015 [place] 10/14/2015 1604   PROTEINUR >300* 10/14/2015 1604   NITRITE NEGATIVE 10/14/2015 1604   LEUKOCYTESUR NEGATIVE 10/14/2015 1604     Recent Results (from the past 240 hour(s))  Culture, blood (Routine X 2) w Reflex to ID Panel     Status: None (Preliminary result)   Collection Time: 01/19/16  9:00 AM  Result Value Ref Range Status   Specimen Description BLOOD HEMODIALYSIS CATHETER  Final   Special Requests BOTTLES DRAWN AEROBIC AND ANAEROBIC 10CC  Final   Culture NO GROWTH 1 DAY  Final   Report Status PENDING  Incomplete  Culture, blood (Routine X 2) w Reflex to ID Panel     Status: None (Preliminary result)   Collection Time: 01/19/16 10:20 AM  Result Value Ref Range Status   Specimen Description BLOOD  Final   Special Requests BOTTLES DRAWN AEROBIC AND ANAEROBIC 10CC  Final   Culture NO GROWTH 2 DAYS  Final   Report Status PENDING  Incomplete      Anti-infectives    Start     Dose/Rate Route Frequency Ordered Stop   01/20/16 1545  vancomycin (VANCOCIN) 1,500 mg in sodium chloride 0.9 % 500 mL IVPB     1,500 mg 250 mL/hr over 120 Minutes Intravenous  Once 01/20/16 1534 01/20/16 1923   01/20/16 1545  piperacillin-tazobactam (ZOSYN) IVPB 2.25 g     2.25 g 100 mL/hr over 30 Minutes Intravenous Every 8 hours 01/20/16 1534     01/17/16 1300  cefUROXime (ZINACEF) 1.5 g in dextrose 5 % 50 mL IVPB     1.5 g 100 mL/hr over 30 Minutes Intravenous To ShortStay Surgical 01/16/16 1237 01/17/16 1039   01/15/16 0600  cefUROXime (ZINACEF) 1.5 g in dextrose 5 % 50 mL IVPB     1.5 g 100 mL/hr over 30 Minutes Intravenous On call to O.R. 01/14/16 1932 01/15/16 1125   01/15/16 0600  cefUROXime (ZINACEF)  1.5 g in dextrose 5 % 50 mL IVPB  Status:  Discontinued     1.5 g 100 mL/hr over 30 Minutes Intravenous On call to O.R. 01/14/16 1932 01/14/16 1934   01/08/16 1200  cefUROXime (ZINACEF) 1.5 g in dextrose 5 % 50 mL IVPB     1.5 g 100 mL/hr over 30 Minutes Intravenous To Short Stay 01/07/16 1958 01/08/16 1245   01/08/16 0000  ceFAZolin (ANCEF) IVPB 1 g/50 mL premix  Status:  Discontinued    Comments:  Send with pt to OR   1 g 100 mL/hr over 30 Minutes Intravenous On call 01/07/16 1958 01/07/16 2010       Radiology Studies: Dg Chest 2 View  01/19/2016  CLINICAL DATA:  Cough for a couple days EXAM: CHEST  2 VIEW COMPARISON:  01/05/2016 FINDINGS: Heart size and vascular pattern are normal. Lungs are clear. Central line on the right with tip into the right atrium. No pneumothorax. IMPRESSION: No active cardiopulmonary disease. Electronically Signed   By: Skipper Cliche M.D.   On: 01/19/2016 17:46   US Abdomen Limited Ruq  01/21/2016  CLINICAL DATA:  40 year old female with history of liver lesion noted on recent CT examination. EXAM: US ABDOMEN LIMITED - RIGHT UPPER QUADRANT COMPARISON:  No priors. FINDINGS: Gallbladder: No gallstones or wall thickening visualized. Nearly completely decompressed. No sonographic Murphy sign noted by sonographer. Common bile duct: Diameter: 11 mm in the porta hepatis. Liver: There are 3 echogenic lesions are noted in the liver, the largest of which is in the right lobe of the liver measuring 2.8 x 2.6 x 3.2 cm. No associated posterior acoustic shadowing. Hepatic echogenicity is within normal limits. IMPRESSION: 1. There is common bile duct dilatation measuring up to 11 mm  in the porta hepatis. There does not appear to be significant intrahepatic biliary ductal dilatation, however, the presence of a dilated common bile duct raises concern for biliary obstruction. Although there are no gallstones, and no definite ductal stone identified in the visualized common bile duct,  the possibility of distal obstruction should be considered. This could be better evaluated with followup MRI of the abdomen with and without IV gadolinium with MRCP if clinically appropriate. 2. 3 liver lesions have imaging characteristics most suggestive of cavernous hemangiomas. This too could be confirmed with followup MRI of the abdomen with and without IV gadolinium if of clinical concern. Electronically Signed   By: Vinnie Langton M.D.   On: 01/21/2016 11:08   Ct Extrem Up Entire Arm L W/cm  01/20/2016  CLINICAL DATA:  Acute onset of left arm erythema and edema, with shortness of breath. Assess for graft infection or abscess. Recent thrombectomy at the left brachial artery. Initial encounter. EXAM: CT OF THE UPPER LEFT EXTREMITY WITH CONTRAST TECHNIQUE: Multidetector CT imaging of the left upper extremity was performed according to the standard protocol following intravenous contrast administration. COMPARISON:  None. CONTRAST:  100 mL of Isovue 370 IV contrast FINDINGS: The patient's arteriovenous fistula does not appear to opacify with contrast, raising suspicion for an occluded fistula. However, evaluation is significantly limited due to artifact and the large field-of-view. A small amount of contrast is seen opacifying the cephalic vein, proximal to the fistula. No focal abscess is seen. Mild vague soft tissue inflammation is noted about the fistula and tracking proximally along the left upper arm, which could reflect underlying infection. No elbow joint effusion is identified. The visualized osseous structures are grossly unremarkable. The lungs appear clear bilaterally, aside from minimal left basilar atelectasis. The right-sided hemodialysis catheter is noted. The thyroid gland is unremarkable. No axillary lymphadenopathy is seen. A 3.7 cm focal hypodensity is noted at the right hepatic lobe. The visualized portions of the spleen, pancreas and adrenal glands are within normal limits. No acute  osseous abnormalities are seen. IMPRESSION: 1. Arteriovenous fistula does not appear to opacify with contrast, raising suspicion for underlying thrombus. However, evaluation is significantly limited due to artifact and the large field of view. Patency of the fistula could be further assessed on Doppler ultrasound, as deemed clinically appropriate. 2. No evidence of abscess. Mild vague soft tissue inflammation about the fistula and tracking proximally along the left upper arm, which could reflect mild infection, depending on the patient's symptoms. 3. 3.7 cm focal hypodensity at the right hepatic lobe. Would correlate with LFTs and evaluate further as deemed clinically appropriate. Electronically Signed   By: Garald Balding M.D.   On: 01/20/2016 23:45        Scheduled Meds: . sodium chloride   Intravenous Once  . [START ON 01/26/2016] darbepoetin (ARANESP) injection - DIALYSIS  100 mcg Intravenous Q Fri-HD  . febuxostat  40 mg Oral Daily  . guaiFENesin  1,200 mg Oral BID  . heparin  5,000 Units Subcutaneous 3 times per day  . hydrALAZINE  75 mg Oral Q8H  . ibuprofen  400 mg Oral Once  . labetalol  300 mg Oral BID  . pantoprazole  40 mg Oral Daily  . piperacillin-tazobactam (ZOSYN)  IV  2.25 g Intravenous Q8H  . polyethylene glycol  34 g Oral Daily  . predniSONE  2.5 mg Oral Q breakfast  . senna-docusate  2 tablet Oral BID  . sevelamer carbonate  1,600 mg Oral TID WC  .  sodium chloride flush  3 mL Intravenous Q12H   Continuous Infusions: . sodium chloride 10 mL/hr at 01/17/16 1003  . sodium chloride       LOS: 16 days    Time spent: 20 min    Areta Terwilliger, MD [facility] Pager [phone]  If 7PM-7AM, please contact night-coverage [email or web address] Password [email or web address] 01/21/2016, 4:32 PM

## 2016-01-21 NOTE — Progress Notes (Signed)
   Daily Progress Note  Assessment/Planning: POD #4 s/p R BC AVF POD #6 s/p L brachial artery TE w/ VPA   Erythema and exam improved today, able to extend elbow with less pain  Pt appears to be responding to abx  CT doesn't demonstrate any fluid pockets.  Some findings consistent with possible cellulitis   Subjective    L arm still painful  Objective Filed Vitals:   01/20/16 1706 01/20/16 2022 01/21/16 0507 01/21/16 0828  BP: 143/59 158/79 153/71 143/69  Pulse: 80 86 75 83  Temp: 98.9 F (37.2 C) 99.8 F (37.7 C) 98.9 F (37.2 C) 99.1 F (37.3 C)  TempSrc: Oral   Oral  Resp: 18 20 18 17   Height:      Weight:      SpO2: 97% 97% 98% 98%    Intake/Output Summary (Last 24 hours) at 01/21/16 1010 Last data filed at 01/20/16 1838  Gross per 24 hour  Intake    480 ml  Output      0 ml  Net    480 ml    VASC  Arm somewhat less swollen, decreased erythema, no drainage, inc c/d/i  Laboratory CBC    Component Value Date/Time   WBC 9.8 01/20/2016 0912   HGB 8.3* 01/20/2016 0912   HCT 27.6* 01/20/2016 0912   PLT 236 01/20/2016 0912    BMET    Component Value Date/Time   NA 136 01/21/2016 0551   K 4.1 01/21/2016 0551   CL 101 01/21/2016 0551   CO2 24 01/21/2016 0551   GLUCOSE 84 01/21/2016 0551   BUN 25* 01/21/2016 0551   CREATININE 5.85* 01/21/2016 0551   CALCIUM 8.3* 01/21/2016 0551   GFRNONAA 8* 01/21/2016 0551   GFRAA 10* 01/21/2016 0551    Adele Barthel, MD [facility] Office: [phone] Pager: [phone]  01/21/2016, 10:10 AM

## 2016-01-22 ENCOUNTER — Telehealth: Payer: Self-pay | Admitting: Vascular Surgery

## 2016-01-22 LAB — RENAL FUNCTION PANEL
ALBUMIN: 1.4 g/dL — AB (ref 3.5–5.0)
Anion gap: 13 (ref 5–15)
BUN: 31 mg/dL — ABNORMAL HIGH (ref 6–20)
CHLORIDE: 100 mmol/L — AB (ref 101–111)
CO2: 24 mmol/L (ref 22–32)
Calcium: 8.4 mg/dL — ABNORMAL LOW (ref 8.9–10.3)
Creatinine, Ser: 6.71 mg/dL — ABNORMAL HIGH (ref 0.44–1.00)
GFR, EST AFRICAN AMERICAN: 8 mL/min — AB (ref >60)
GFR, EST NON AFRICAN AMERICAN: 7 mL/min — AB (ref >60)
Glucose, Bld: 96 mg/dL (ref 65–99)
PHOSPHORUS: 7.2 mg/dL — AB (ref 2.5–4.6)
POTASSIUM: 3.8 mmol/L (ref 3.5–5.1)
Sodium: 137 mmol/L (ref 135–145)

## 2016-01-22 LAB — CBC
HEMATOCRIT: 26.5 % — AB (ref 36.0–46.0)
Hemoglobin: 8 g/dL — ABNORMAL LOW (ref 12.0–15.0)
MCH: 28.9 pg (ref 26.0–34.0)
MCHC: 30.2 g/dL (ref 30.0–36.0)
MCV: 95.7 fL (ref 78.0–100.0)
Platelets: 316 10*3/uL (ref 150–400)
RBC: 2.77 MIL/uL — AB (ref 3.87–5.11)
RDW: 16.7 % — AB (ref 11.5–15.5)
WBC: 8.1 10*3/uL (ref 4.0–10.5)

## 2016-01-22 MED ORDER — VANCOMYCIN HCL IN DEXTROSE 1-5 GM/200ML-% IV SOLN: 1000.0000 mg | INTRAVENOUS | Status: DC

## 2016-01-22 MED ORDER — PREDNISONE 2.5 MG PO TABS
2.5000 mg | ORAL_TABLET | Freq: Every day | ORAL | Status: DC
Start: 2016-01-22 — End: 2022-08-05

## 2016-01-22 MED ORDER — OXYCODONE HCL 5 MG PO TABS
ORAL_TABLET | ORAL | Status: AC
  Administered 2016-01-22: 10 mg via ORAL
  Filled 2016-01-22: qty 2

## 2016-01-22 MED ORDER — FEBUXOSTAT 40 MG PO TABS: 40.0000 mg | ORAL_TABLET | Freq: Every day | ORAL | Status: DC

## 2016-01-22 MED ORDER — VANCOMYCIN HCL IN DEXTROSE 1-5 GM/200ML-% IV SOLN
1000.0000 mg | INTRAVENOUS | Status: DC
  Administered 2016-01-22: 1000 mg via INTRAVENOUS
  Filled 2016-01-22: qty 200

## 2016-01-22 MED ORDER — OXYCODONE HCL 5 MG PO TABS: 5.0000 mg | ORAL_TABLET | Freq: Four times a day (QID) | ORAL | Status: DC | PRN

## 2016-01-22 MED ORDER — VANCOMYCIN HCL IN DEXTROSE 1-5 GM/200ML-% IV SOLN
INTRAVENOUS | Status: AC
  Filled 2016-01-22: qty 200

## 2016-01-22 MED ORDER — SEVELAMER CARBONATE 800 MG PO TABS: 1600.0000 mg | ORAL_TABLET | Freq: Three times a day (TID) | ORAL | Status: DC

## 2016-01-22 MED ORDER — HYDRALAZINE HCL 25 MG PO TABS: 75.0000 mg | ORAL_TABLET | Freq: Three times a day (TID) | ORAL | Status: DC

## 2016-01-22 MED ORDER — LABETALOL HCL 300 MG PO TABS: 300.0000 mg | ORAL_TABLET | Freq: Two times a day (BID) | ORAL | Status: DC

## 2016-01-22 NOTE — Telephone Encounter (Signed)
sched lab 6/1 at 4 and md 6/13 at 8:30. Spoke to pt to inform them of appt.

## 2016-01-22 NOTE — Telephone Encounter (Signed)
-----   Message from Mena Goes, RN sent at 01/17/2016  1:36 PM EDT ----- Regarding: schedule   ----- Message -----    From: Alvia Grove, PA-C    Sent: 01/17/2016  11:42 AM      To: [facility] Charge Pool  S/p right brachial cephalic AV fistula [id number]  F/u with Dr. Donnetta Hutching in 4 weeks with duplex.  Thanks Maudie Mercury

## 2016-01-22 NOTE — Progress Notes (Signed)
OT Cancellation Note  Patient Details Name: Sandy [name] MRN: [id number] DOB: 1976/06/23   Cancelled Treatment:    Reason Eval/Treat Not Completed: Other (comment).. Attempted to see pt at 12:28pm; however she reported that she was just back from dialysis and really tired. She was able to tell me the exercises that I had told her to 2 days prior for her LUE.   Almon Register [id number] 01/22/2016, 5:01 PM

## 2016-01-22 NOTE — Discharge Summary (Signed)
Sandy [name], is a 40 y.o. female  DOB Jun 09, 1976  MRN [id number].  Admission date:  01/05/2016  Admitting Physician  Lily Kocher, MD  Discharge Date:  01/22/2016   Primary MD  No PCP Per Patient  Recommendations for primary care physician for things to follow:  - Patient to continue the vancomycin after hemodialysis for next week for treatment of left arm cellulitis(this coming Wednesday, Friday and Monday) - Follow-up with vascular surgery on 4 weeks   Admission Diagnosis  bi-lateral severe leg swelling / Stage IV Chronic Kidney Disease N18.4 End Stage Renal Disease N18.6   Discharge Diagnosis  bi-lateral severe leg swelling / Stage IV Chronic Kidney Disease N18.4 End Stage Renal Disease N18.6    Principal Problem:   Hypervolemia Active Problems:   Anasarca associated with disorder of kidney   CKD (chronic kidney disease), stage IV (HCC)   Accelerated hypertension   Atypical chest pain   Volume overload      Past Medical History  Diagnosis Date  . Hypertension   . Anemia   . 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 Anasarca associated with disorder of kidney     Past Surgical History  Procedure Laterality Date  . Cesarean section    . Renal biopsy    . Av fistula placement Left 11/24/2015    Procedure: ARTERIOVENOUS (AV) FISTULA CREATION-LEFT UPPER ARM;  Surgeon: Rosetta Posner, MD;  Location: [place];  Service: Vascular;  Laterality: Left;  . Insertion of dialysis catheter Right 01/08/2016    Procedure: INSERTION OF DIALYSIS CATHETER L;  Surgeon: Rosetta Posner, MD;  Location: [place];  Service: Vascular;  Laterality: Right;  . Av fistula placement Left 01/15/2016    Procedure: Embolectomy of Left Arm Fistula with revision of Brachiocephalic fistula and vein patch angioplasty;  Surgeon: Rosetta Posner, MD;  Location: [place];  Service: Vascular;   Laterality: Left;  . Av fistula placement Right 01/17/2016    Procedure: Right Arm Brachial ARTERIOVENOUS FISTULA CREATION ;  Surgeon: Rosetta Posner, MD;  Location: [place];  Service: Vascular;  Laterality: Right;       History of present illness and  Hospital Course:     Kindly see H&P for history of present illness and admission details, please review complete Labs, Consult reports and Test reports for all details in brief  HPI  from the history and physical done on the day of admission 01/06/2016 HPI: Sandy [name] is a 40 y.o. 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 She reports a 23lb weight gain over the past 7-10 days. She has had atypical chest pain and shortness of breath. She held her diuretic for two days because she developed bilateral foot pain that she thought was gout. The foot pain resolved without specific intervention. She  tries to adhere to a 1.5L fluid restriction daily and a low sodium diet, but she admits that she traveled last week and had an increased amount of fast food. She had vascular access placed in her left arm recently, but she has not needed hemodialysis thus far. ED evaluation shows creatinine near her baseline, but she is acidotic. She also has accelerated HTN, but she has missed some of her medications today. Hospitalist asked to admit for diuresis.   Hospital Course  Patient admitted with anasarca, progression of CKD. She was started on HD. Her first HD was 4-17. Has been clipped and plan is for right arm hematemesis axis today by Dr. Donnetta Hutching, patient status post left brachial artery thrombectomy and vein patch angioplasty on 4/24, as well as right upper arm brachiocephalic fistula insertion on 4/26, continues to have pain in left upper extremity, venous Doppler negative for DVT, but significant  for superficial thrombophlebitis, evaluated by vascular surgery, left arm CT with contrast with no evidence of abscess, finding most likely related to cellulitis, improving on IV antibiotics   Anasarca, nephrotic syndrome, FSGS, CKD stage 4 progression to ESRD -[facility] ( [place]) on a Monday, Wednesday and Friday 2nd shift schedule.The patient has been instructed to arrive at 11:10 on her 1st day of treatment. - Seen by nephrology, hemodialysis managed by renal - Status post Left brachial artery thrombectomy and vein patch angioplasty - right upper arm brachiocephalic fistula insertion on 4/26 - Started on Renvela   Left upper extremity swelling cellulitis - Patient with left upper extremity edema and swelling - As Doppler negative for DVT, but significant for superficial thrombophlebitis - Vascular surgery input appreciated, CT left upper extremity with IV contrast does not demonstrate any fluid pockets, finding most likely related to cellulitis - Treated with IV Zosyn and vancomycin, will be discharged on IV vancomycin after hemodialysis for next week - Blood cultures remain negative at time of discharge   Atypical chest pain - troponin negative X3 - Echo EF 60 %  Anemia of chronic disease (renal failure) - Received 1 unit PRBC 4/28 -defer to renal  Accelerated HTN - Agent and hydralazine and labetalol at home, blood pressure and control during hospital stay, dose was increased during hospital stay, will start on higher dose    Discharge Condition: stable   Follow UP  Follow-up Information    Follow up with Curt Jews, MD In 4 weeks.   Specialties:  Vascular Surgery, Cardiology   Why:  Our office will call you to arrange an appointment (sent)   Contact information:   [place] [id number] [phone]         Discharge Instructions  and  Discharge Medications     Discharge Instructions    Discharge instructions     Complete by:  As directed   Follow with Primary MD in 7 days   Get CBC, CMP,  checked  by Primary MD next visit.    Activity: As tolerated with Full fall precautions use walker/cane & assistance as needed   Disposition Home    Diet: Heart Healthy ,renal modified with 1200 mL fluid restriction , with feeding assistance and aspiration precautions.  For Heart failure patients - Check your Weight same time everyday, if you gain over 2 pounds, or you develop in leg swelling, experience more shortness of breath or chest pain, call your Primary MD immediately. Follow Cardiac Low Salt Diet and 1.2 lit/day fluid restriction.   On  your next visit with your primary care physician please Get Medicines reviewed and adjusted.   Please request your Prim.MD to go over all Hospital Tests and Procedure/Radiological results at the follow up, please get all Hospital records sent to your Prim MD by signing hospital release before you go home.   If you experience worsening of your admission symptoms, develop shortness of breath, life threatening emergency, suicidal or homicidal thoughts you must seek medical attention immediately by calling 911 or calling your MD immediately  if symptoms less severe.  You Must read complete instructions/literature along with all the possible adverse reactions/side effects for all the Medicines you take and that have been prescribed to you. Take any new Medicines after you have completely understood and accpet all the possible adverse reactions/side effects.   Do not drive, operating heavy machinery, perform activities at heights, swimming or participation in water activities or provide baby sitting services if your were admitted for syncope or siezures until you have seen by Primary MD or a Neurologist and advised to do so again.  Do not drive when taking Pain medications.    Do not take more than prescribed Pain, Sleep and Anxiety Medications  Special Instructions: If  you have smoked or chewed Tobacco  in the last 2 yrs please stop smoking, stop any regular Alcohol  and or any Recreational drug use.  Wear Seat belts while driving.   Please note  You were cared for by a hospitalist during your hospital stay. If you have any questions about your discharge medications or the care you received while you were in the hospital after you are discharged, you can call the unit and asked to speak with the hospitalist on call if the hospitalist that took care of you is not available. Once you are discharged, your primary care physician will handle any further medical issues. Please note that NO REFILLS for any discharge medications will be authorized once you are discharged, as it is imperative that you return to your primary care physician (or establish a relationship with a primary care physician if you do not have one) for your aftercare needs so that they can reassess your need for medications and monitor your lab values.     Increase activity slowly    Complete by:  As directed             Medication List    STOP taking these medications        metolazone 10 MG tablet  Commonly known as:  ZAROXOLYN     sodium bicarbonate 650 MG tablet     spironolactone 50 MG tablet  Commonly known as:  ALDACTONE     torsemide 100 MG tablet  Commonly known as:  DEMADEX      TAKE these medications        acetaminophen 500 MG tablet  Commonly known as:  TYLENOL  Take 500 mg by mouth every 6 (six) hours as needed for moderate pain.     febuxostat 40 MG tablet  Commonly known as:  ULORIC  Take 1 tablet (40 mg total) by mouth daily.     hydrALAZINE 25 MG tablet  Commonly known as:  APRESOLINE  Take 3 tablets (75 mg total) by mouth every 8 (eight) hours.     labetalol 300 MG tablet  Commonly known as:  NORMODYNE  Take 1 tablet (300 mg total) by mouth 2 (two) times daily.     oxyCODONE 5 MG immediate release tablet  Commonly known as:  Oxy IR/ROXICODONE  Take  1-2 tablets (5-10 mg total) by mouth every 6 (six) hours as needed for severe pain or breakthrough pain.     predniSONE 2.5 MG tablet  Commonly known as:  DELTASONE  Take 1 tablet (2.5 mg total) by mouth daily with breakfast.     sevelamer carbonate 800 MG tablet  Commonly known as:  RENVELA  Take 2 tablets (1,600 mg total) by mouth 3 (three) times daily with meals.     vancomycin 1-5 GM/200ML-% Soln  Commonly known as:  VANCOCIN  Inject 200 mLs (1,000 mg total) into the vein every Monday, Wednesday, and Friday with hemodialysis. Sandy [name] to continue during hemodialysis for 1 week for next Wednesday, Friday and Monday, then stop.          Diet and Activity recommendation: See Discharge Instructions above   Consults obtained -  Renal  Vascular surgery  Major procedures and Radiology Reports - PLEASE review detailed and final reports for all details, in brief -   Left brachial artery thrombectomy and vein patch angioplasty 4/24 Right upper arm brachiocephalic fistula [id number]   Dg Chest 2 View  01/19/2016  CLINICAL DATA:  Cough for a couple days EXAM: CHEST  2 VIEW COMPARISON:  01/05/2016 FINDINGS: Heart size and vascular pattern are normal. Lungs are clear. Central line on the right with tip into the right atrium. No pneumothorax. IMPRESSION: No active cardiopulmonary disease. Electronically Signed   By: Skipper Cliche M.D.   On: 01/19/2016 17:46   Dg Chest 2 View  01/05/2016  CLINICAL DATA:  Bilateral foot edema and abdominal edema for 1 week. EXAM: CHEST  2 VIEW COMPARISON:  10/14/2015. FINDINGS: Trachea is midline. Heart size normal. Minimal subpleural atelectasis in the left upper lobe and the left lung base. Lungs are otherwise clear. Trace left pleural effusion. IMPRESSION: Minimal left basilar atelectasis and tiny left pleural effusion. Electronically Signed   By: Lorin Picket M.D.   On: 01/05/2016 17:11   US Abdomen Limited Ruq  01/21/2016  CLINICAL DATA:  40 year old  female with history of liver lesion noted on recent CT examination. EXAM: US ABDOMEN LIMITED - RIGHT UPPER QUADRANT COMPARISON:  No priors. FINDINGS: Gallbladder: No gallstones or wall thickening visualized. Nearly completely decompressed. No sonographic Murphy sign noted by sonographer. Common bile duct: Diameter: 11 mm in the porta hepatis. Liver: There are 3 echogenic lesions are noted in the liver, the largest of which is in the right lobe of the liver measuring 2.8 x 2.6 x 3.2 cm. No associated posterior acoustic shadowing. Hepatic echogenicity is within normal limits. IMPRESSION: 1. There is common bile duct dilatation measuring up to 11 mm in the porta hepatis. There does not appear to be significant intrahepatic biliary ductal dilatation, however, the presence of a dilated common bile duct raises concern for biliary obstruction. Although there are no gallstones, and no definite ductal stone identified in the visualized common bile duct, the possibility of distal obstruction should be considered. This could be better evaluated with followup MRI of the abdomen with and without IV gadolinium with MRCP if clinically appropriate. 2. 3 liver lesions have imaging characteristics most suggestive of cavernous hemangiomas. This too could be confirmed with followup MRI of the abdomen with and without IV gadolinium if of clinical concern. Electronically Signed   By: Vinnie Langton M.D.   On: 01/21/2016 11:08   Ct Extrem Up Entire Arm L W/cm  01/20/2016  CLINICAL DATA:  Acute onset  of left arm erythema and edema, with shortness of breath. Assess for graft infection or abscess. Recent thrombectomy at the left brachial artery. Initial encounter. EXAM: CT OF THE UPPER LEFT EXTREMITY WITH CONTRAST TECHNIQUE: Multidetector CT imaging of the left upper extremity was performed according to the standard protocol following intravenous contrast administration. COMPARISON:  None. CONTRAST:  100 mL of Isovue 370 IV contrast  FINDINGS: The patient's arteriovenous fistula does not appear to opacify with contrast, raising suspicion for an occluded fistula. However, evaluation is significantly limited due to artifact and the large field-of-view. A small amount of contrast is seen opacifying the cephalic vein, proximal to the fistula. No focal abscess is seen. Mild vague soft tissue inflammation is noted about the fistula and tracking proximally along the left upper arm, which could reflect underlying infection. No elbow joint effusion is identified. The visualized osseous structures are grossly unremarkable. The lungs appear clear bilaterally, aside from minimal left basilar atelectasis. The right-sided hemodialysis catheter is noted. The thyroid gland is unremarkable. No axillary lymphadenopathy is seen. A 3.7 cm focal hypodensity is noted at the right hepatic lobe. The visualized portions of the spleen, pancreas and adrenal glands are within normal limits. No acute osseous abnormalities are seen. IMPRESSION: 1. Arteriovenous fistula does not appear to opacify with contrast, raising suspicion for underlying thrombus. However, evaluation is significantly limited due to artifact and the large field of view. Patency of the fistula could be further assessed on Doppler ultrasound, as deemed clinically appropriate. 2. No evidence of abscess. Mild vague soft tissue inflammation about the fistula and tracking proximally along the left upper arm, which could reflect mild infection, depending on the patient's symptoms. 3. 3.7 cm focal hypodensity at the right hepatic lobe. Would correlate with LFTs and evaluate further as deemed clinically appropriate. Electronically Signed   By: Garald Balding M.D.   On: 01/20/2016 23:45    Micro Results     Recent Results (from the past 240 hour(s))  Culture, blood (Routine X 2) w Reflex to ID Panel     Status: None (Preliminary result)   Collection Time: 01/19/16  9:00 AM  Result Value Ref Range Status     Specimen Description BLOOD HEMODIALYSIS CATHETER  Final   Special Requests BOTTLES DRAWN AEROBIC AND ANAEROBIC 10CC  Final   Culture NO GROWTH 2 DAYS  Final   Report Status PENDING  Incomplete  Culture, blood (Routine X 2) w Reflex to ID Panel     Status: None (Preliminary result)   Collection Time: 01/19/16 10:20 AM  Result Value Ref Range Status   Specimen Description BLOOD  Final   Special Requests BOTTLES DRAWN AEROBIC AND ANAEROBIC 10CC  Final   Culture NO GROWTH 3 DAYS  Final   Report Status PENDING  Incomplete       Today   Subjective:   Sandy [name] today Afebrile, still complains of left upper extremity pain, but reports it is improving . Objective:   Blood pressure 158/74, pulse 95, temperature 99.1 F (37.3 C), temperature source Oral, resp. rate 18, height 5\' 6"  (1.676 m), weight 97.8 kg (215 lb 9.8 oz), SpO2 99 %.   Intake/Output Summary (Last 24 hours) at 01/22/16 1530 Last data filed at 01/22/16 1041  Gross per 24 hour  Intake    240 ml  Output   2000 ml  Net  -1760 ml    Exam General exam: Awake, communicative, appropriate Chest: Good air entry bilaterally, no wheezing or rhonchi Cardiovascular: rrr,  no rubs or gallops Abdomen: Soft, nontender, nondistended EXT: Pedal pulses felt bilaterally, L arm swelling is improving   Data Review   CBC w Diff: Lab Results  Component Value Date   WBC 8.1 01/22/2016   HGB 8.0* 01/22/2016   HCT 26.5* 01/22/2016   PLT 316 01/22/2016   LYMPHOPCT 21 01/19/2016   MONOPCT 13 01/19/2016   EOSPCT 3 01/19/2016   BASOPCT 0 01/19/2016    CMP: Lab Results  Component Value Date   NA 137 01/22/2016   K 3.8 01/22/2016   CL 100* 01/22/2016   CO2 24 01/22/2016   BUN 31* 01/22/2016   CREATININE 6.71* 01/22/2016   PROT 5.0* 01/21/2016   ALBUMIN 1.4* 01/22/2016   BILITOT 0.7 01/21/2016   ALKPHOS 266* 01/21/2016   AST 14* 01/21/2016   ALT 37 01/21/2016  .   Total Time in preparing paper work, data evaluation and  todays exam - 35 minutes  Sandy [name] M.D on 01/22/2016 at 3:30 PM  [facility]   Office  [phone]

## 2016-01-22 NOTE — Progress Notes (Signed)
[facility] ROUNDING NOTE   Subjective:   Interval History:  Dialysis dependent nephrotic syndrome. Doing well this AM, no acute concerns  Objective:  Vital signs in last 24 hours:  Temp:  [98.5 F (36.9 C)-99.1 F (37.3 C)] 99.1 F (37.3 C) (05/01 0700) Pulse Rate:  [80-107] 94 (05/01 1000) Resp:  [16-21] 20 (05/01 0700) BP: (122-171)/(66-96) 146/72 mmHg (05/01 1000) SpO2:  [97 %-100 %] 99 % (05/01 0524) Weight:  [97.659 kg (215 lb 4.8 oz)-97.8 kg (215 lb 9.8 oz)] 97.8 kg (215 lb 9.8 oz) (05/01 0700)  Weight change:  Filed Weights   01/20/16 0411 01/21/16 2014 01/22/16 0700  Weight: 86.8 kg (191 lb 5.8 oz) 97.659 kg (215 lb 4.8 oz) 97.8 kg (215 lb 9.8 oz)    Intake/Output: I/O last 3 completed shifts: In: 72 [P.O.:720] Out: 0    Intake/Output this shift:     Physical exam: Gen: NAD  HEENT: MMM Cardio: RRR Pulm: CTAB Ext: compression hose in place, continues to have LE edema.  Sacral edema +1. Continued LUE   Basic Metabolic Panel:  Recent Labs Lab 01/18/16 0455 01/19/16 0333 01/20/16 0619 01/21/16 0551 01/22/16 0523  NA 139 137 137 136 137  K 4.1 4.7 3.8 4.1 3.8  CL 102 101 100* 101 100*  CO2 28 26 28 24 24   GLUCOSE 91 76 79 84 96  BUN 15 23* 15 25* 31*  CREATININE 3.72* 5.74* 4.55* 5.85* 6.71*  CALCIUM 7.7* 8.2* 8.3* 8.3* 8.4*  PHOS 4.4 6.0* 5.3* 6.6* 7.2*    Liver Function Tests:  Recent Labs Lab 01/18/16 0455 01/19/16 0333 01/20/16 0619 01/21/16 0551 01/22/16 0523  AST  --   --   --  14*  --   ALT  --   --   --  37  --   ALKPHOS  --   --   --  266*  --   BILITOT  --   --   --  0.7  --   PROT  --   --   --  5.0*  --   ALBUMIN 1.5* 1.4* 1.3* 1.3*  1.3* 1.4*   No results for input(s): LIPASE, AMYLASE in the last 168 hours. No results for input(s): AMMONIA in the last 168 hours.  CBC:  Recent Labs Lab 01/17/16 0953 01/17/16 1000 01/18/16 0455 01/19/16 0918 01/20/16 0912 01/22/16 0718  WBC 8.3  --  8.8 10.3 9.8  8.1  NEUTROABS  --   --   --  6.5  --   --   HGB 7.7* 8.2* 7.6* 7.4* 8.3* 8.0*  HCT 26.5* 24.0* 26.7* 25.4* 27.6* 26.5*  MCV 96.7  --  98.9 100.8* 97.5 95.7  PLT 168  --  190 212 236 316    Cardiac Enzymes: No results for input(s): CKTOTAL, CKMB, CKMBINDEX, TROPONINI in the last 168 hours.  BNP: Invalid input(s): POCBNP  CBG: No results for input(s): GLUCAP in the last 168 hours.  Microbiology: Results for orders placed or performed during the hospital encounter of 01/05/16  Surgical pcr screen     Status: Abnormal   Collection Time: 01/07/16 10:08 PM  Result Value Ref Range Status   MRSA, PCR NEGATIVE NEGATIVE Final   Staphylococcus aureus POSITIVE (A) NEGATIVE Final    Comment:        The Xpert SA Assay (FDA approved for NASAL specimens in patients over 88 years of age), is one component of a comprehensive surveillance program.  Test performance  has been validated by [facility] for patients greater than or equal to 52 year old. It is not intended to diagnose infection nor to guide or monitor treatment.   Culture, blood (Routine X 2) w Reflex to ID Panel     Status: None (Preliminary result)   Collection Time: 01/19/16  9:00 AM  Result Value Ref Range Status   Specimen Description BLOOD HEMODIALYSIS CATHETER  Final   Special Requests BOTTLES DRAWN AEROBIC AND ANAEROBIC 10CC  Final   Culture NO GROWTH 1 DAY  Final   Report Status PENDING  Incomplete  Culture, blood (Routine X 2) w Reflex to ID Panel     Status: None (Preliminary result)   Collection Time: 01/19/16 10:20 AM  Result Value Ref Range Status   Specimen Description BLOOD  Final   Special Requests BOTTLES DRAWN AEROBIC AND ANAEROBIC 10CC  Final   Culture NO GROWTH 2 DAYS  Final   Report Status PENDING  Incomplete    Coagulation Studies: No results for input(s): LABPROT, INR in the last 72 hours.  Urinalysis: No results for input(s): COLORURINE, LABSPEC, PHURINE, GLUCOSEU, HGBUR, BILIRUBINUR,  KETONESUR, PROTEINUR, UROBILINOGEN, NITRITE, LEUKOCYTESUR in the last 72 hours.  Invalid input(s): APPERANCEUR    Imaging: US Abdomen Limited Ruq  01/21/2016  CLINICAL DATA:  40 year old female with history of liver lesion noted on recent CT examination. EXAM: US ABDOMEN LIMITED - RIGHT UPPER QUADRANT COMPARISON:  No priors. FINDINGS: Gallbladder: No gallstones or wall thickening visualized. Nearly completely decompressed. No sonographic Murphy sign noted by sonographer. Common bile duct: Diameter: 11 mm in the porta hepatis. Liver: There are 3 echogenic lesions are noted in the liver, the largest of which is in the right lobe of the liver measuring 2.8 x 2.6 x 3.2 cm. No associated posterior acoustic shadowing. Hepatic echogenicity is within normal limits. IMPRESSION: 1. There is common bile duct dilatation measuring up to 11 mm in the porta hepatis. There does not appear to be significant intrahepatic biliary ductal dilatation, however, the presence of a dilated common bile duct raises concern for biliary obstruction. Although there are no gallstones, and no definite ductal stone identified in the visualized common bile duct, the possibility of distal obstruction should be considered. This could be better evaluated with followup MRI of the abdomen with and without IV gadolinium with MRCP if clinically appropriate. 2. 3 liver lesions have imaging characteristics most suggestive of cavernous hemangiomas. This too could be confirmed with followup MRI of the abdomen with and without IV gadolinium if of clinical concern. Electronically Signed   By: Vinnie Langton M.D.   On: 01/21/2016 11:08   Ct Extrem Up Entire Arm L W/cm  01/20/2016  CLINICAL DATA:  Acute onset of left arm erythema and edema, with shortness of breath. Assess for graft infection or abscess. Recent thrombectomy at the left brachial artery. Initial encounter. EXAM: CT OF THE UPPER LEFT EXTREMITY WITH CONTRAST TECHNIQUE: Multidetector CT  imaging of the left upper extremity was performed according to the standard protocol following intravenous contrast administration. COMPARISON:  None. CONTRAST:  100 mL of Isovue 370 IV contrast FINDINGS: The patient's arteriovenous fistula does not appear to opacify with contrast, raising suspicion for an occluded fistula. However, evaluation is significantly limited due to artifact and the large field-of-view. A small amount of contrast is seen opacifying the cephalic vein, proximal to the fistula. No focal abscess is seen. Mild vague soft tissue inflammation is noted about the fistula and tracking proximally along the  left upper arm, which could reflect underlying infection. No elbow joint effusion is identified. The visualized osseous structures are grossly unremarkable. The lungs appear clear bilaterally, aside from minimal left basilar atelectasis. The right-sided hemodialysis catheter is noted. The thyroid gland is unremarkable. No axillary lymphadenopathy is seen. A 3.7 cm focal hypodensity is noted at the right hepatic lobe. The visualized portions of the spleen, pancreas and adrenal glands are within normal limits. No acute osseous abnormalities are seen. IMPRESSION: 1. Arteriovenous fistula does not appear to opacify with contrast, raising suspicion for underlying thrombus. However, evaluation is significantly limited due to artifact and the large field of view. Patency of the fistula could be further assessed on Doppler ultrasound, as deemed clinically appropriate. 2. No evidence of abscess. Mild vague soft tissue inflammation about the fistula and tracking proximally along the left upper arm, which could reflect mild infection, depending on the patient's symptoms. 3. 3.7 cm focal hypodensity at the right hepatic lobe. Would correlate with LFTs and evaluate further as deemed clinically appropriate. Electronically Signed   By: Garald Balding M.D.   On: 01/20/2016 23:45     Medications:   . sodium  chloride 10 mL/hr at 01/17/16 1003  . sodium chloride     . sodium chloride   Intravenous Once  . [START ON 01/26/2016] darbepoetin (ARANESP) injection - DIALYSIS  100 mcg Intravenous Q Fri-HD  . febuxostat  40 mg Oral Daily  . guaiFENesin  1,200 mg Oral BID  . heparin  5,000 Units Subcutaneous 3 times per day  . hydrALAZINE  75 mg Oral Q8H  . ibuprofen  400 mg Oral Once  . labetalol  300 mg Oral BID  . pantoprazole  40 mg Oral Daily  . piperacillin-tazobactam (ZOSYN)  IV  2.25 g Intravenous Q8H  . polyethylene glycol  34 g Oral Daily  . predniSONE  2.5 mg Oral Q breakfast  . senna-docusate  2 tablet Oral BID  . sevelamer carbonate  1,600 mg Oral TID WC  . sodium chloride flush  3 mL Intravenous Q12H   acetaminophen, hydrALAZINE, ondansetron **OR** ondansetron (ZOFRAN) IV, oxyCODONE, promethazine  Assessment/ Plan:   Assessment/Plan: 40 year old black female with a prolonged course with FSG that's been refractory to treatment now presenting with worsening volume status in the setting of weaning steroids and also uremic symptoms that will necessitate initiation of dialysis   1. 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  R IJ HD catheter placement 4/17. s/p RUE brachiocephalic fistula [id number]. UOP 0cc/ 24h.  K stable 3.8.  Cr 6.71 .  Phos 7.2 today. Renvela added 4/29. Continue prednisone taper.  Will likely need this upon DC. Patient set up for HD at [facility] MWF 2nd shift. 2. Hypertension/volume - BP elevated. She is on labetalol and scheduled hydralazine. Continue fluid removal. 3. Pains in her legs- oxycodone PRN pain 4. Anemia - Hgb  8.3 on 4/29;  getting ferric gluconate daliy 5. Bones- last PTH 132 as an OP- phos rose from 4.4 to 6 so renvela 1600mg  qac started. 6. LUE pain - Superficial thrombosis noted in the left cephalic vein, at the [facility] level. CT showed possible cellulitis. On Vanc/Zosyn per primary for concern that  swelling related to cellulitis/thrombophlebitis. Blood cultures no growth to date   Alyssa A. Lincoln Brigham MD, [facility] Resident PGY-2 Pager [phone]  Renal Attending: She had HD today with 2700cc removed.  BP better. Will need to make arrangements for  OP HD. She remains overloaded and will need reduction in EDW over timeand hopeful weaning of some more meds.  Pietra Zuluaga C

## 2016-01-22 NOTE — Progress Notes (Signed)
[facility] Progress Note  Subjective    Pain in left arm improved.   Objective Filed Vitals:   01/22/16 1000 01/22/16 1030  BP: 146/72 148/78  Pulse: 94 100  Temp:    Resp:      Intake/Output Summary (Last 24 hours) at 01/22/16 1050 Last data filed at 01/21/16 1842  Gross per 24 hour  Intake    480 ml  Output      0 ml  Net    480 ml   Erythema of left arm improved. Antecubital incision clean and intact. No drainage seen.  Left hand iis well perfused.  Right arm fistula with palpable thrill  Assessment/Planning: 39 y.o. female is s/p:  POD #5 s/p R BC AVF POD #7 s/p L brachial artery TE w/ VPA  Erythema and pain improving left arm with IV abx.  Right arm fistula patent.  Ok to d/c home from vascular standpoint with abx for possible left arm cellulitis.   Alvia Grove 01/22/2016 10:50 AM --  Laboratory CBC    Component Value Date/Time   WBC 8.1 01/22/2016 0718   HGB 8.0* 01/22/2016 0718   HCT 26.5* 01/22/2016 0718   PLT 316 01/22/2016 0718    BMET    Component Value Date/Time   NA 137 01/22/2016 0523   K 3.8 01/22/2016 0523   CL 100* 01/22/2016 0523   CO2 24 01/22/2016 0523   GLUCOSE 96 01/22/2016 0523   BUN 31* 01/22/2016 0523   CREATININE 6.71* 01/22/2016 0523   CALCIUM 8.4* 01/22/2016 0523   GFRNONAA 7* 01/22/2016 0523   GFRAA 8* 01/22/2016 0523    COAG Lab Results  Component Value Date   INR 0.89 01/17/2016   INR 0.94 01/15/2016   INR 0.96 01/08/2016   No results found for: PTT  Antibiotics Anti-infectives    Start     Dose/Rate Route Frequency Ordered Stop   01/20/16 1545  vancomycin (VANCOCIN) 1,500 mg in sodium chloride 0.9 % 500 mL IVPB     1,500 mg 250 mL/hr over 120 Minutes Intravenous  Once 01/20/16 1534 01/20/16 1923   01/20/16 1545  piperacillin-tazobactam (ZOSYN) IVPB 2.25 g     2.25 g 100 mL/hr over 30 Minutes Intravenous Every 8 hours 01/20/16 1534     01/17/16 1300  cefUROXime (ZINACEF) 1.5 g in  dextrose 5 % 50 mL IVPB     1.5 g 100 mL/hr over 30 Minutes Intravenous To ShortStay Surgical 01/16/16 1237 01/17/16 1039   01/15/16 0600  cefUROXime (ZINACEF) 1.5 g in dextrose 5 % 50 mL IVPB     1.5 g 100 mL/hr over 30 Minutes Intravenous On call to O.R. 01/14/16 1932 01/15/16 1125   01/15/16 0600  cefUROXime (ZINACEF) 1.5 g in dextrose 5 % 50 mL IVPB  Status:  Discontinued     1.5 g 100 mL/hr over 30 Minutes Intravenous On call to O.R. 01/14/16 1932 01/14/16 1934   01/08/16 1200  cefUROXime (ZINACEF) 1.5 g in dextrose 5 % 50 mL IVPB     1.5 g 100 mL/hr over 30 Minutes Intravenous To Short Stay 01/07/16 1958 01/08/16 1245   01/08/16 0000  ceFAZolin (ANCEF) IVPB 1 g/50 mL premix  Status:  Discontinued    Comments:  Send with pt to OR   1 g 100 mL/hr over 30 Minutes Intravenous On call 01/07/16 1958 01/07/16 2010       Virgina Jock, PA-C [facility] Office: [phone] Pager: [phone] 01/22/2016  10:50 AM

## 2016-01-22 NOTE — Discharge Instructions (Signed)
Follow with Primary MD in 7 days   Get CBC, CMP,  checked  by Primary MD next visit.    Activity: As tolerated with Full fall precautions use walker/cane & assistance as needed   Disposition Home    Diet: Heart Healthy ,renal modified with 1200 mL fluid restriction , with feeding assistance and aspiration precautions.  For Heart failure patients - Check your Weight same time everyday, if you gain over 2 pounds, or you develop in leg swelling, experience more shortness of breath or chest pain, call your Primary MD immediately. Follow Cardiac Low Salt Diet and 1.2 lit/day fluid restriction.   On your next visit with your primary care physician please Get Medicines reviewed and adjusted.   Please request your Prim.MD to go over all Hospital Tests and Procedure/Radiological results at the follow up, please get all Hospital records sent to your Prim MD by signing hospital release before you go home.   If you experience worsening of your admission symptoms, develop shortness of breath, life threatening emergency, suicidal or homicidal thoughts you must seek medical attention immediately by calling 911 or calling your MD immediately  if symptoms less severe.  You Must read complete instructions/literature along with all the possible adverse reactions/side effects for all the Medicines you take and that have been prescribed to you. Take any new Medicines after you have completely understood and accpet all the possible adverse reactions/side effects.   Do not drive, operating heavy machinery, perform activities at heights, swimming or participation in water activities or provide baby sitting services if your were admitted for syncope or siezures until you have seen by Primary MD or a Neurologist and advised to do so again.  Do not drive when taking Pain medications.    Do not take more than prescribed Pain, Sleep and Anxiety Medications  Special Instructions: If you have smoked or chewed  Tobacco  in the last 2 yrs please stop smoking, stop any regular Alcohol  and or any Recreational drug use.  Wear Seat belts while driving.   Please note  You were cared for by a hospitalist during your hospital stay. If you have any questions about your discharge medications or the care you received while you were in the hospital after you are discharged, you can call the unit and asked to speak with the hospitalist on call if the hospitalist that took care of you is not available. Once you are discharged, your primary care physician will handle any further medical issues. Please note that NO REFILLS for any discharge medications will be authorized once you are discharged, as it is imperative that you return to your primary care physician (or establish a relationship with a primary care physician if you do not have one) for your aftercare needs so that they can reassess your need for medications and monitor your lab values.

## 2016-01-22 NOTE — Care Management Note (Addendum)
Case Management Note  Patient Details  Name: Sandy [name] MRN: [id number] Date of Birth: 01/01/1976  Subjective/Objective:                 CM following for progression and d/c planning.  Action/Plan: 01/22/2016 Noted order for HHPT, however this pt insurance does not cover HHPT services. Given pt age hopefully she will be more motivated at home to increase activity.  Pt was given number for Medicaid transportation, however she already has the number and stated to this CM that she had applied for [employer] and had received a return call.   Expected Discharge Date:     01/22/2016             Expected Discharge Plan:  Home/Self Care  In-House Referral:  Clinical Social Work  Discharge planning Services  CM Consult  Post Acute Care Choice:    Choice offered to:  NA  DME Arranged:  N/A DME Agency:  NA  HH Arranged:  NA HH Agency:  NA  Status of Service:  Completed, signed off  Medicare Important Message Given:    Date Medicare IM Given:    Medicare IM give by:    Date Additional Medicare IM Given:    Additional Medicare Important Message give by:     If discussed at [place] of Stay Meetings, dates discussed:    Additional Comments:  Adron Bene, RN 01/22/2016, 4:10 PM

## 2016-01-22 NOTE — Progress Notes (Signed)
Sandy [name] to be D/C'd Home per MD order.  Discussed prescriptions and follow up appointments with the patient. Prescriptions given to patient, medication list explained in detail. Pt verbalized understanding.    Medication List    STOP taking these medications        metolazone 10 MG tablet  Commonly known as:  ZAROXOLYN     sodium bicarbonate 650 MG tablet     spironolactone 50 MG tablet  Commonly known as:  ALDACTONE     torsemide 100 MG tablet  Commonly known as:  DEMADEX      TAKE these medications        acetaminophen 500 MG tablet  Commonly known as:  TYLENOL  Take 500 mg by mouth every 6 (six) hours as needed for moderate pain.     febuxostat 40 MG tablet  Commonly known as:  ULORIC  Take 1 tablet (40 mg total) by mouth daily.     hydrALAZINE 25 MG tablet  Commonly known as:  APRESOLINE  Take 3 tablets (75 mg total) by mouth every 8 (eight) hours.     labetalol 300 MG tablet  Commonly known as:  NORMODYNE  Take 1 tablet (300 mg total) by mouth 2 (two) times daily.     oxyCODONE 5 MG immediate release tablet  Commonly known as:  Oxy IR/ROXICODONE  Take 1-2 tablets (5-10 mg total) by mouth every 6 (six) hours as needed for severe pain or breakthrough pain.     predniSONE 2.5 MG tablet  Commonly known as:  DELTASONE  Take 1 tablet (2.5 mg total) by mouth daily with breakfast.     sevelamer carbonate 800 MG tablet  Commonly known as:  RENVELA  Take 2 tablets (1,600 mg total) by mouth 3 (three) times daily with meals.     vancomycin 1-5 GM/200ML-% Soln  Commonly known as:  VANCOCIN  Inject 200 mLs (1,000 mg total) into the vein every Monday, Wednesday, and Friday with hemodialysis. [name] to continue during hemodialysis for 1 week for next Wednesday, Friday and Monday, then stop.        Filed Vitals:   01/22/16 1041 01/22/16 1122  BP: 160/77 158/74  Pulse: 93 95  Temp: 98.9 F (37.2 C) 99.1 F (37.3 C)  Resp: 18 18    Skin clean, dry and intact  without evidence of skin break down, no evidence of skin tears noted. IV catheter discontinued intact. Site without signs and symptoms of complications. Dressing and pressure applied. Pt denies pain at this time. No complaints noted.  An After Visit Summary was printed and given to the patient. Patient escorted via [place], and D/C home via private auto.  [name] BSN, RN

## 2016-01-24 LAB — CULTURE, BLOOD (ROUTINE X 2): CULTURE: NO GROWTH

## 2016-01-25 LAB — CULTURE, BLOOD (ROUTINE X 2): CULTURE: NO GROWTH

## 2016-02-22 ENCOUNTER — Ambulatory Visit (HOSPITAL_COMMUNITY): Payer: Medicaid Other | Attending: Vascular Surgery

## 2016-02-29 ENCOUNTER — Encounter: Payer: Self-pay | Admitting: Vascular Surgery

## 2016-03-05 ENCOUNTER — Encounter: Payer: Medicaid Other | Admitting: Vascular Surgery

## 2016-07-22 IMAGING — CT CT EXTREM UP ENTIRE ARM*L* W/ CM
4 of 10 series · 7 of 20 positions shown, 8 images · IV contrast (Iodine)
Comparison: None.

CONTRAST:  100 mL of Isovue 370 IV contrast

CLINICAL DATA: Acute onset of left arm erythema and edema, with
shortness of breath. Assess for graft infection or abscess. Recent
thrombectomy at the left brachial artery. Initial encounter.

EXAM:
CT OF THE UPPER LEFT EXTREMITY WITH CONTRAST
TECHNIQUE: Multidetector CT imaging of the left upper extremity was performed
according to the standard protocol following intravenous contrast
administration.

[Series 202: soft tissue · axial · 0.58mm/px · z∈[-155,-67]mm · 2 of 176 slices shown]
[im 44/176  soft-tissue]
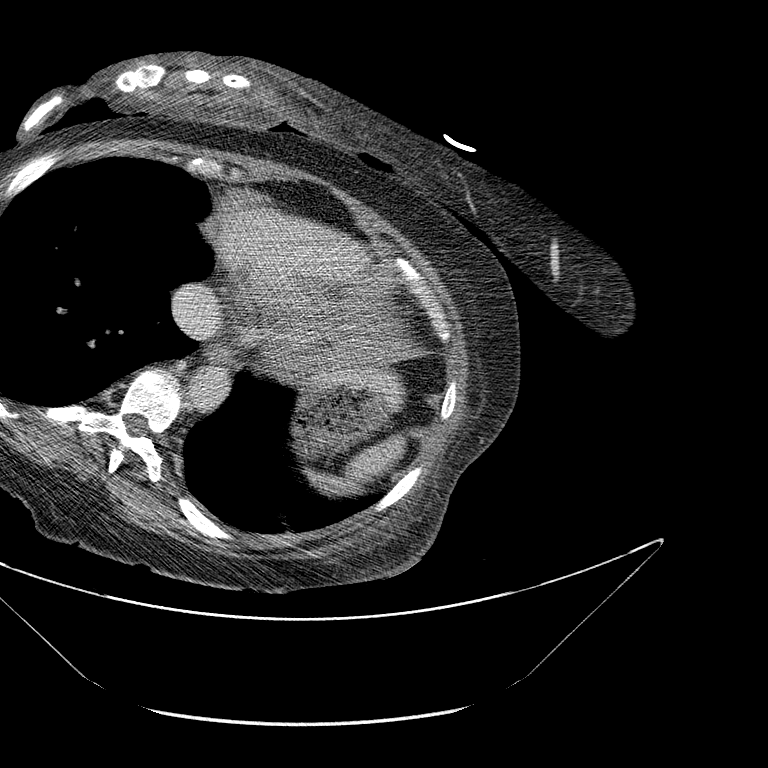
[im 88/176  soft-tissue]
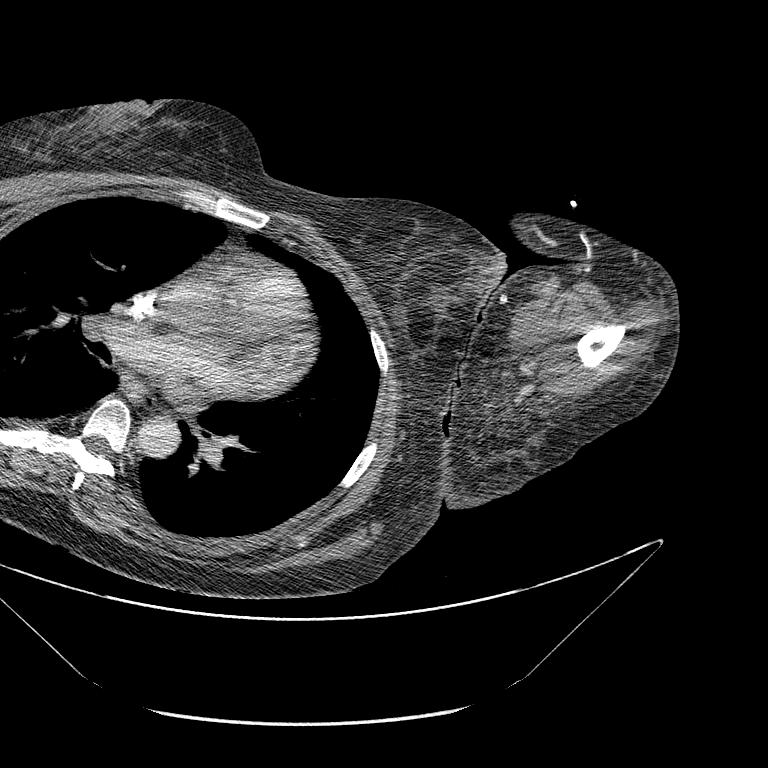

[Series 2012: cor thick mips 20x5 · coronal · 0.66mm/px · 1 of 53 slices shown]
[im 33/53  bone]
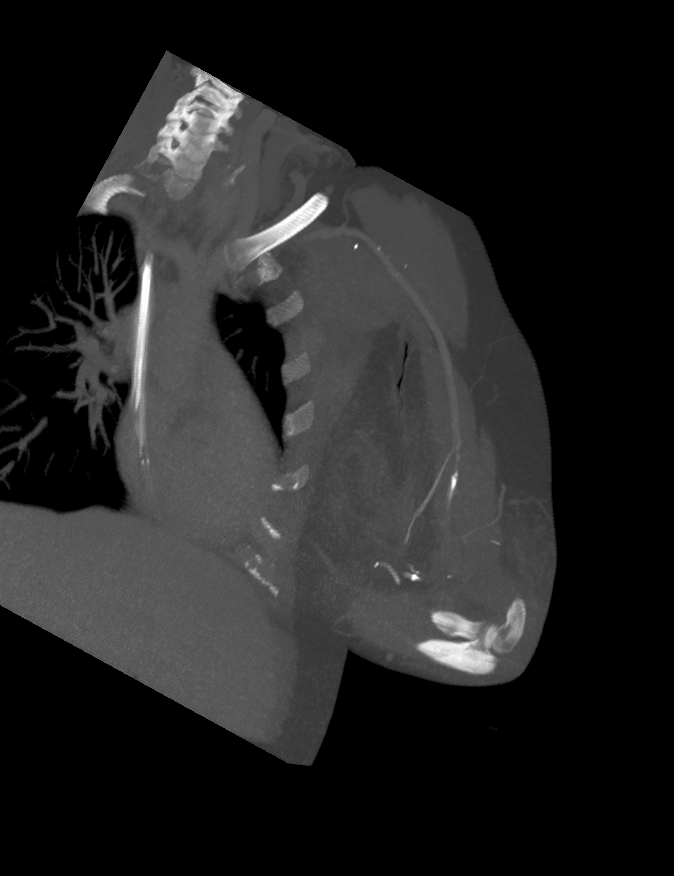

[Series 2019: sag forearm thin mips · axial · 0.64mm/px · z∈[-23,+56]mm · 2 of 129 slices shown, 3 images]
[im 43/129  soft-tissue]
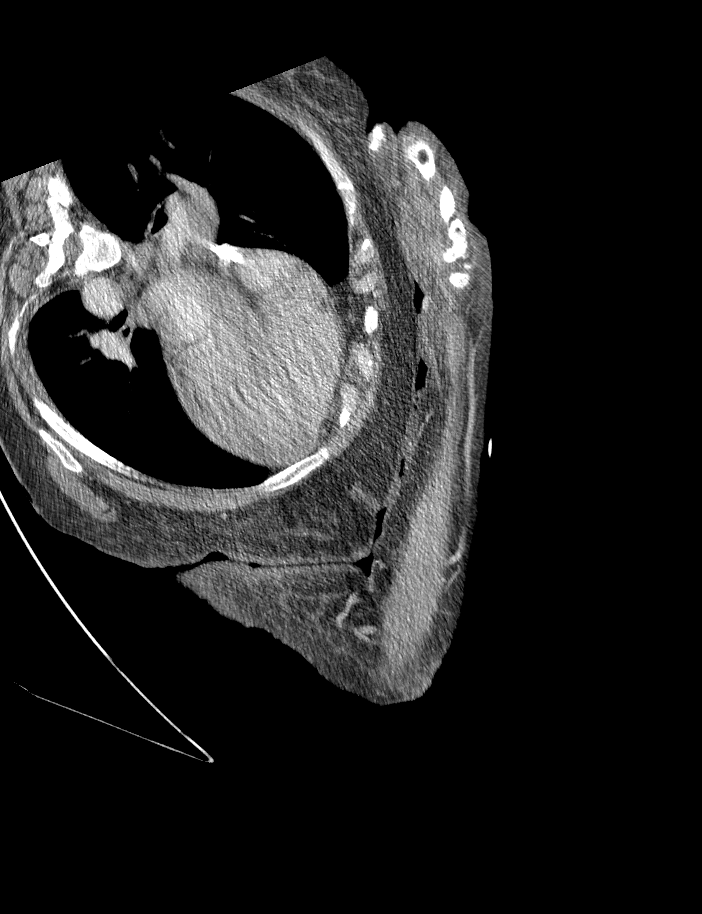
[im 43/129  bone]
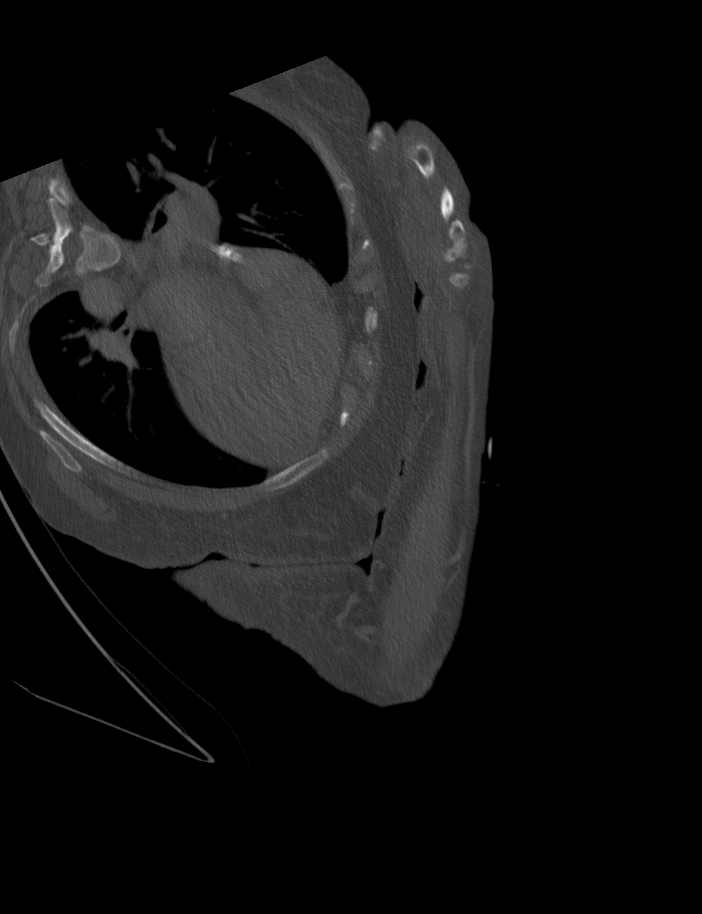
[im 86/129  bone]
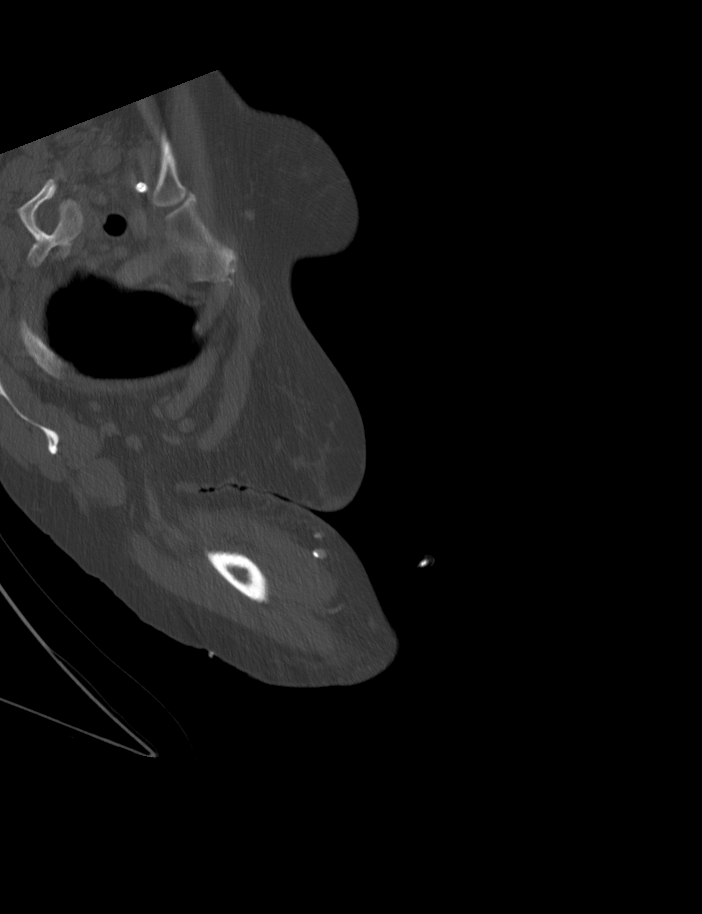

[Series 2020: sag forearm mprs · axial · 0.64mm/px · z∈[-23,+56]mm · 2 of 129 slices shown]
[im 43/129  bone]
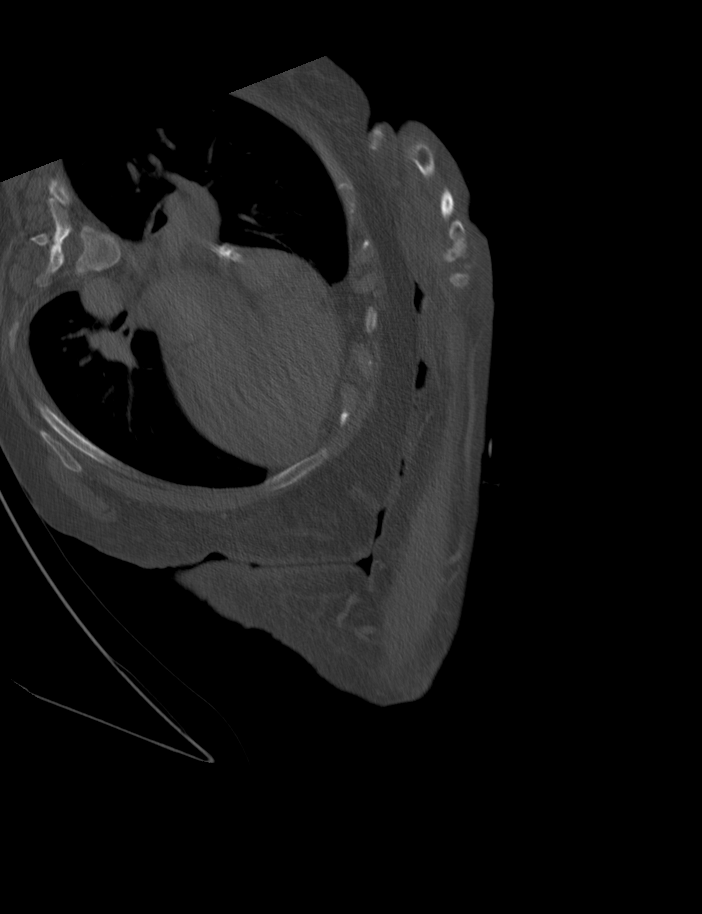
[im 86/129  bone]
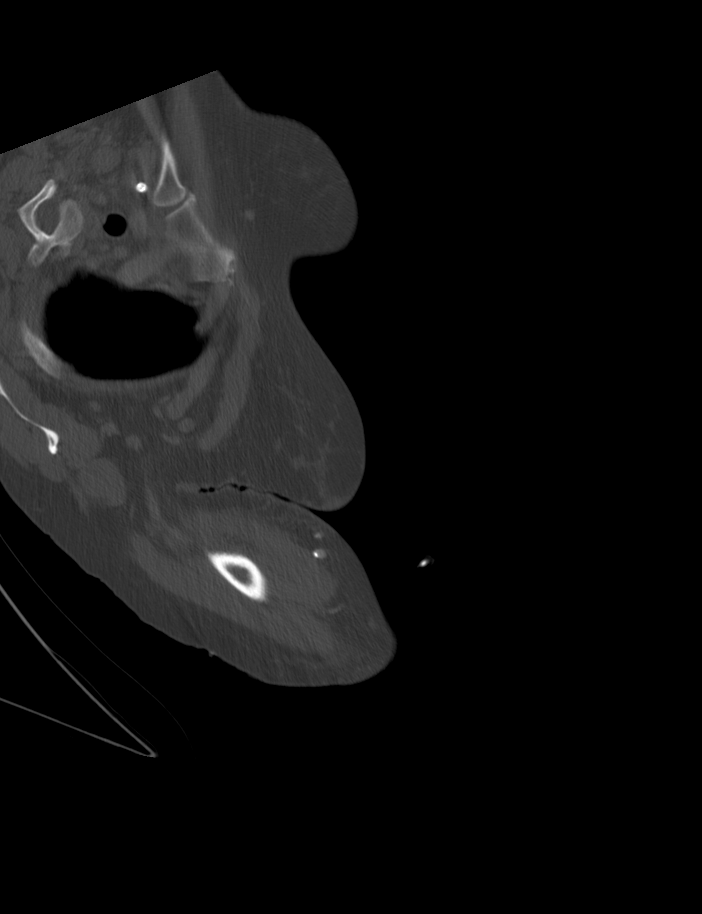

[7 of 20 positions shown; findings below may reference images not displayed]

FINDINGS: The patient's arteriovenous fistula does not appear to opacify with
contrast, raising suspicion for an occluded fistula. However,
evaluation is significantly limited due to artifact and the large
field-of-view. A small amount of contrast is seen opacifying the
cephalic vein, proximal to the fistula.

No focal abscess is seen. Mild vague soft tissue inflammation is
noted about the fistula and tracking proximally along the left upper
arm, which could reflect underlying infection.

No elbow joint effusion is identified. The visualized osseous
structures are grossly unremarkable.

The lungs appear clear bilaterally, aside from minimal left basilar
atelectasis. The right-sided hemodialysis catheter is noted. The
thyroid gland is unremarkable. No axillary lymphadenopathy is seen.

A 3.7 cm focal hypodensity is noted at the right hepatic lobe. The
visualized portions of the spleen, pancreas and adrenal glands are
within normal limits.

No acute osseous abnormalities are seen.
IMPRESSION: 1. Arteriovenous fistula does not appear to opacify with contrast,
raising suspicion for underlying thrombus. However, evaluation is
significantly limited due to artifact and the large field of view.
Patency of the fistula could be further assessed on Doppler
ultrasound, as deemed clinically appropriate.
2. No evidence of abscess. Mild vague soft tissue inflammation about
the fistula and tracking proximally along the left upper arm, which
could reflect mild infection, depending on the patient's symptoms.
3. 3.7 cm focal hypodensity at the right hepatic lobe. Would
correlate with LFTs and evaluate further as deemed clinically
appropriate.

## 2017-04-23 IMAGING — US US ABDOMEN LIMITED
1 series · 13 of 25 positions shown · non-contrast
Comparison: No priors.

CLINICAL DATA: 39-year-old female with history of liver lesion
noted on recent CT examination.

EXAM:
US ABDOMEN LIMITED - RIGHT UPPER QUADRANT

[Series 1: us abdomen limited · 0.23mm/px · 13 of 45 slices shown]
[im 1/45]
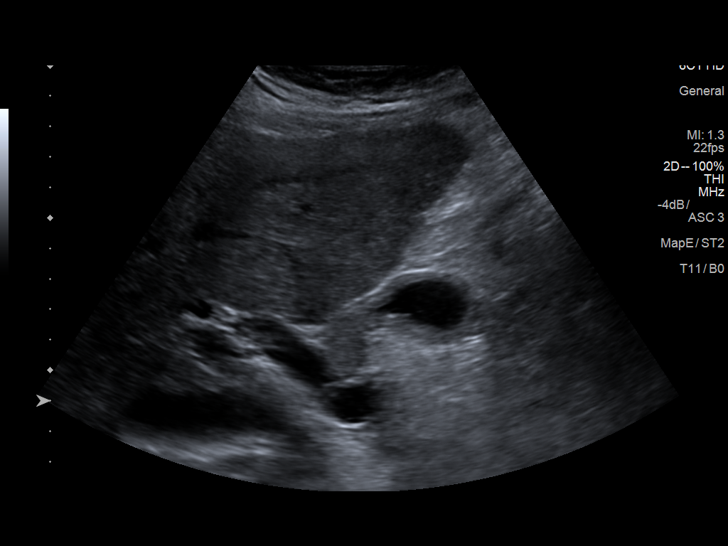
[im 4/45]
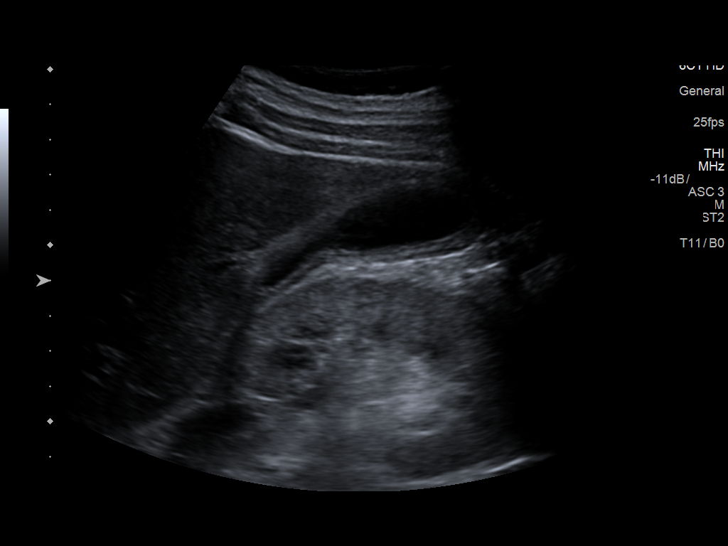
[im 8/45]
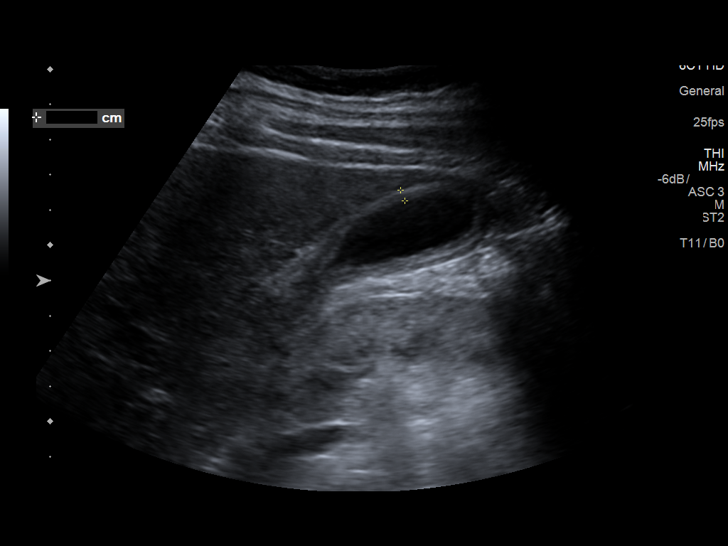
[im 12/45]
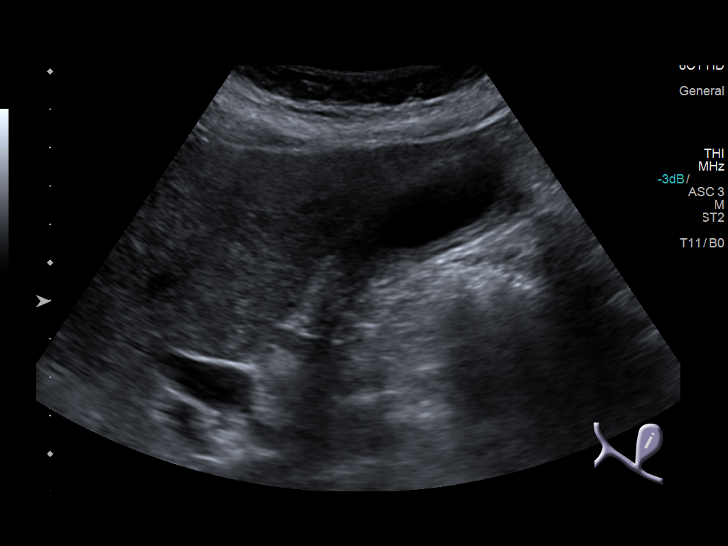
[im 15/45]
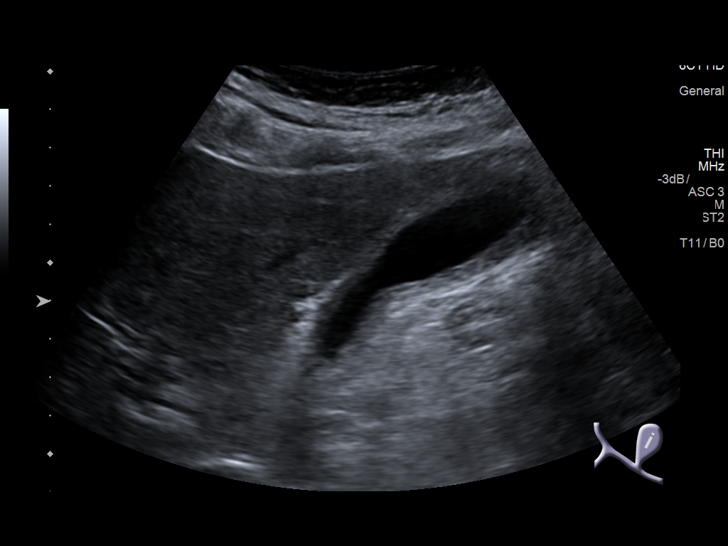
[im 19/45]
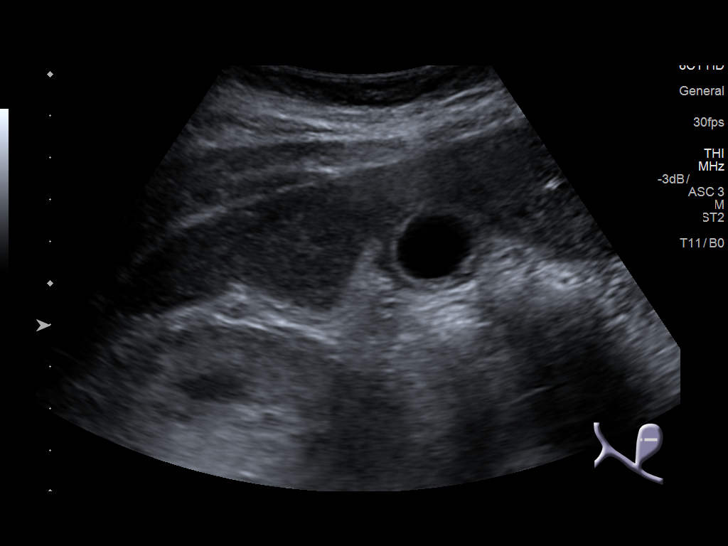
[im 23/45]
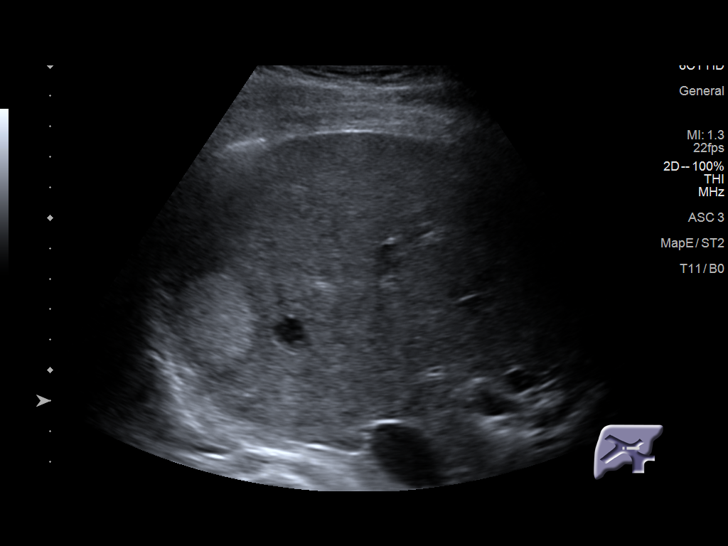
[im 26/45]
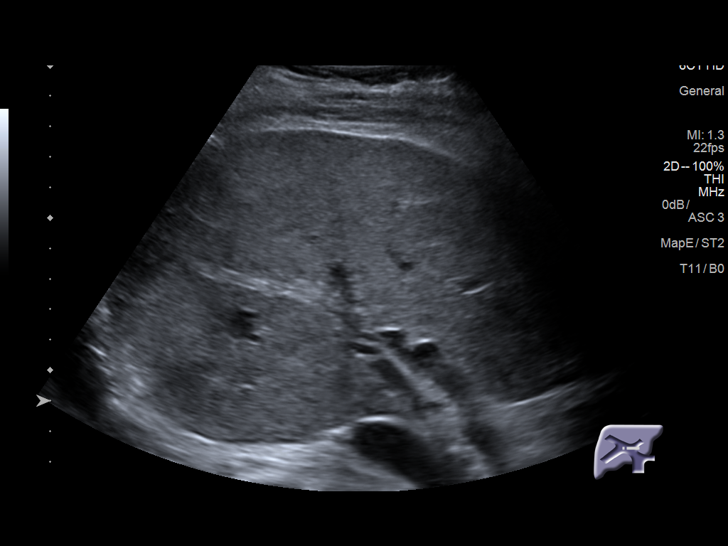
[im 30/45]
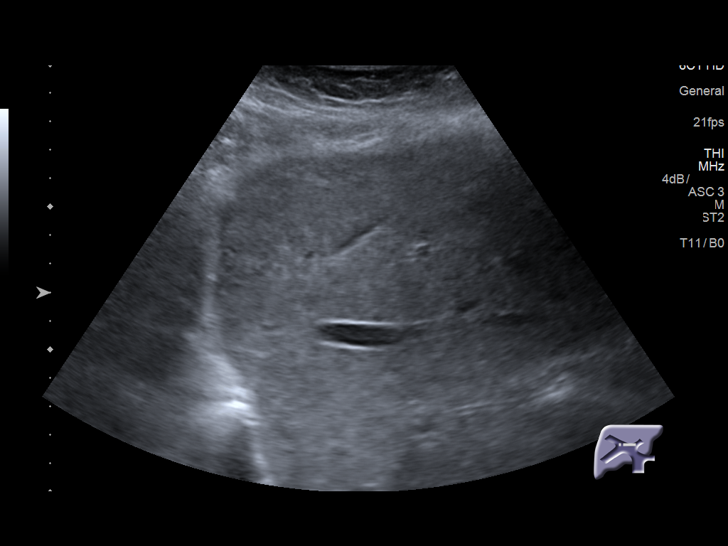
[im 34/45]
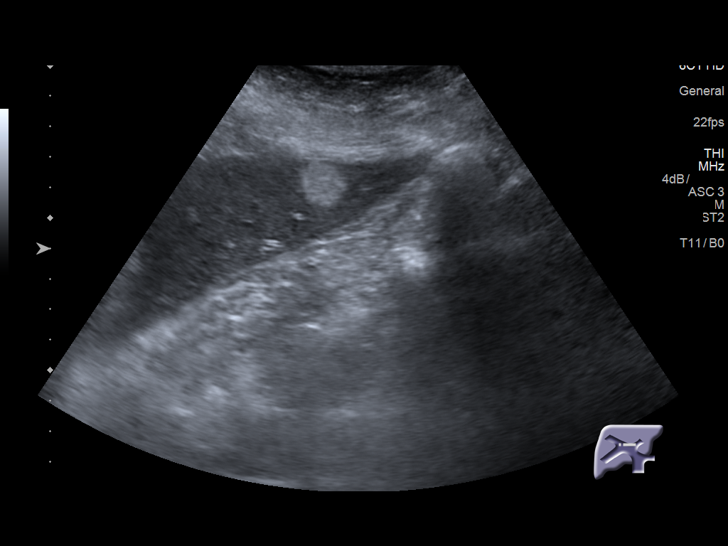
[im 37/45]
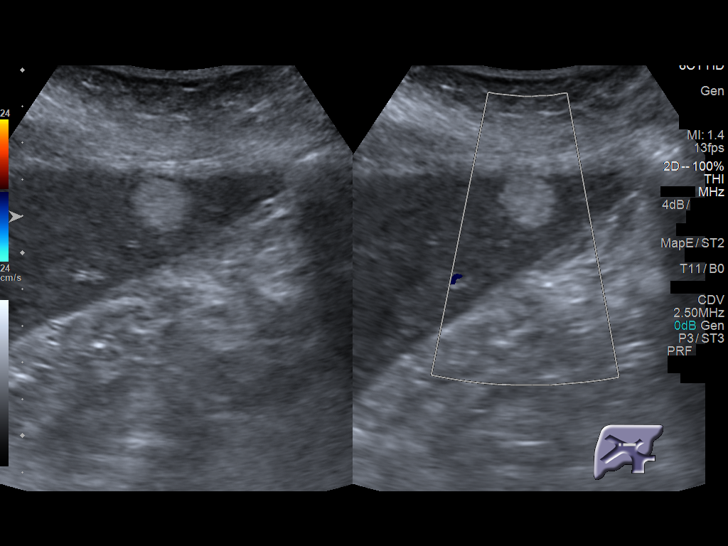
[im 41/45]
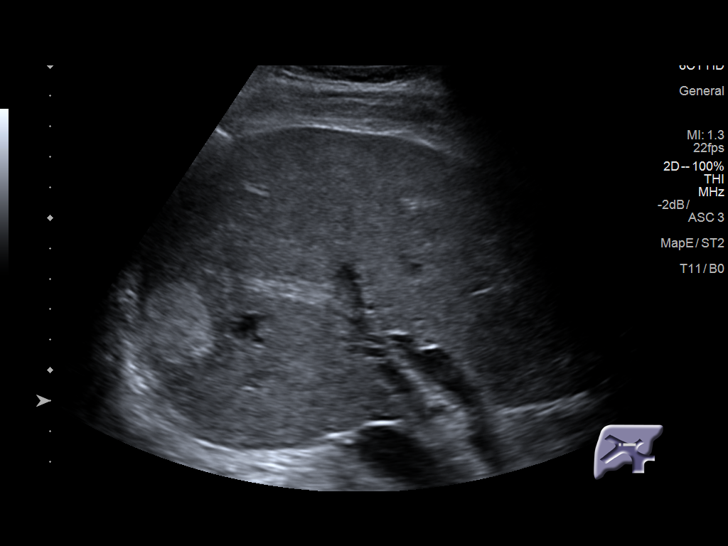
[im 45/45]
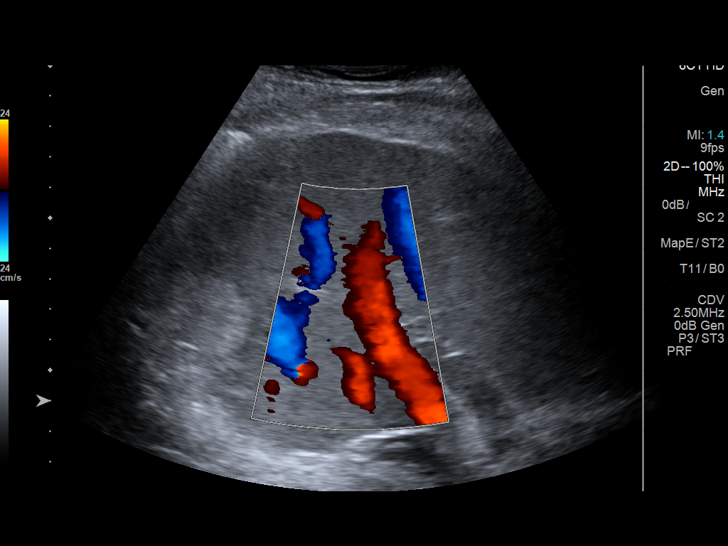

[13 of 25 positions shown; findings below may reference images not displayed]

FINDINGS: Gallbladder:

No gallstones or wall thickening visualized. Nearly completely
decompressed. No sonographic Murphy sign noted by sonographer.

Common bile duct:

Diameter: 11 mm in the porta hepatis.

Liver:

There are 3 echogenic lesions are noted in the liver, the largest of
which is in the right lobe of the liver measuring 2.8 x 2.6 x
cm. No associated posterior acoustic shadowing. Hepatic echogenicity
is within normal limits.
IMPRESSION: 1. There is common bile duct dilatation measuring up to 11 mm in the
porta hepatis. There does not appear to be significant intrahepatic
biliary ductal dilatation, however, the presence of a dilated common
bile duct raises concern for biliary obstruction. Although there are
no gallstones, and no definite ductal stone identified in the
visualized common bile duct, the possibility of distal obstruction
should be considered. This could be better evaluated with followup
MRI of the abdomen with and without IV gadolinium with MRCP if
clinically appropriate.
2. 3 liver lesions have imaging characteristics most suggestive of
cavernous hemangiomas. This too could be confirmed with followup MRI
of the abdomen with and without IV gadolinium if of clinical
concern.

## 2022-08-03 ENCOUNTER — Other Ambulatory Visit: Payer: Self-pay

## 2022-08-03 ENCOUNTER — Emergency Department (HOSPITAL_COMMUNITY): Payer: Medicare Other

## 2022-08-03 ENCOUNTER — Emergency Department (HOSPITAL_COMMUNITY)
Admission: EM | Admit: 2022-08-03 | Discharge: 2022-08-04 | Discharge status: Against Medical Advice | Payer: Medicare Other | Source: Home / Self Care

## 2022-08-03 ENCOUNTER — Encounter (HOSPITAL_COMMUNITY): Payer: Self-pay

## 2022-08-03 DIAGNOSIS — Z5321 Procedure and treatment not carried out due to patient leaving prior to being seen by health care provider: Secondary | ICD-10-CM | POA: Insufficient documentation

## 2022-08-03 DIAGNOSIS — Z992 Dependence on renal dialysis: Secondary | ICD-10-CM | POA: Insufficient documentation

## 2022-08-03 DIAGNOSIS — R0602 Shortness of breath: Secondary | ICD-10-CM | POA: Insufficient documentation

## 2022-08-03 DIAGNOSIS — E875 Hyperkalemia: Secondary | ICD-10-CM | POA: Diagnosis not present

## 2022-08-03 DIAGNOSIS — R0789 Other chest pain: Secondary | ICD-10-CM | POA: Insufficient documentation

## 2022-08-03 NOTE — ED Triage Notes (Signed)
Patient said she feels short of breath and cannot breathe. Said she feels like her calcium is low. Has no appetite. Dialysis patient Tuesday, Thursday and Saturday. Her dialysis center was overfull and said to got to ED.

## 2022-08-03 NOTE — ED Provider Triage Note (Signed)
Emergency Medicine Provider Triage Evaluation Note  Sherlyne Crownover , a 46 y.o. female  was evaluated in triage.  Pt complains of shortness of breath and chest discomfortx1 day that is worse on exertion. States is from out of town, T/Th/S dialysis and missed dialysis today because they didn't have a chair for her.  Review of Systems  Positive: Chest pain, SOB, BLE edema Negative: Fever, chills  Physical Exam  BP 109/76   Pulse 94   Temp 98 F (36.7 C) (Oral)   Resp 20   SpO2 94%  Gen:   Awake, no distress   Resp:  Normal effort  MSK:   Moves extremities without difficulty  Other:  +nonpitting BLE   Medical Decision Making  Medically screening exam initiated at 4:11 PM.  Appropriate orders placed.  Jolie Strohecker was informed that the remainder of the evaluation will be completed by another provider, this initial triage assessment does not replace that evaluation, and the importance of remaining in the ED until their evaluation is complete.     Osvaldo Shipper, [place] 08/03/22 [phone]

## 2022-08-04 ENCOUNTER — Emergency Department (HOSPITAL_COMMUNITY): Payer: Medicare Other

## 2022-08-04 ENCOUNTER — Other Ambulatory Visit: Payer: Self-pay

## 2022-08-04 ENCOUNTER — Inpatient Hospital Stay (HOSPITAL_COMMUNITY)
Admission: EM | Admit: 2022-08-04 | Discharge: 2022-08-08 | DRG: 640 | Disposition: A | Discharge status: Home / Self Care | Payer: Medicare Other | Attending: Internal Medicine | Admitting: Internal Medicine

## 2022-08-04 DIAGNOSIS — I10 Essential (primary) hypertension: Secondary | ICD-10-CM | POA: Diagnosis present

## 2022-08-04 DIAGNOSIS — E8889 Other specified metabolic disorders: Secondary | ICD-10-CM | POA: Diagnosis present

## 2022-08-04 DIAGNOSIS — E669 Obesity, unspecified: Secondary | ICD-10-CM | POA: Diagnosis present

## 2022-08-04 DIAGNOSIS — Z7952 Long term (current) use of systemic steroids: Secondary | ICD-10-CM

## 2022-08-04 DIAGNOSIS — N186 End stage renal disease: Secondary | ICD-10-CM

## 2022-08-04 DIAGNOSIS — R0602 Shortness of breath: Secondary | ICD-10-CM

## 2022-08-04 DIAGNOSIS — D72829 Elevated white blood cell count, unspecified: Secondary | ICD-10-CM | POA: Diagnosis present

## 2022-08-04 DIAGNOSIS — E892 Postprocedural hypoparathyroidism: Secondary | ICD-10-CM | POA: Diagnosis present

## 2022-08-04 DIAGNOSIS — E872 Acidosis, unspecified: Secondary | ICD-10-CM | POA: Diagnosis present

## 2022-08-04 DIAGNOSIS — Z9089 Acquired absence of other organs: Secondary | ICD-10-CM

## 2022-08-04 DIAGNOSIS — Z79899 Other long term (current) drug therapy: Secondary | ICD-10-CM

## 2022-08-04 DIAGNOSIS — Z8674 Personal history of sudden cardiac arrest: Secondary | ICD-10-CM

## 2022-08-04 DIAGNOSIS — M545 Low back pain, unspecified: Secondary | ICD-10-CM | POA: Diagnosis present

## 2022-08-04 DIAGNOSIS — Z87891 Personal history of nicotine dependence: Secondary | ICD-10-CM

## 2022-08-04 DIAGNOSIS — Z79891 Long term (current) use of opiate analgesic: Secondary | ICD-10-CM

## 2022-08-04 DIAGNOSIS — Z992 Dependence on renal dialysis: Secondary | ICD-10-CM

## 2022-08-04 DIAGNOSIS — E875 Hyperkalemia: Principal | ICD-10-CM

## 2022-08-04 DIAGNOSIS — G8929 Other chronic pain: Secondary | ICD-10-CM | POA: Diagnosis present

## 2022-08-04 DIAGNOSIS — D631 Anemia in chronic kidney disease: Secondary | ICD-10-CM | POA: Diagnosis present

## 2022-08-04 DIAGNOSIS — Z6834 Body mass index (BMI) 34.0-34.9, adult: Secondary | ICD-10-CM

## 2022-08-04 DIAGNOSIS — E8770 Fluid overload, unspecified: Secondary | ICD-10-CM | POA: Diagnosis present

## 2022-08-04 DIAGNOSIS — J810 Acute pulmonary edema: Secondary | ICD-10-CM | POA: Diagnosis present

## 2022-08-04 DIAGNOSIS — I953 Hypotension of hemodialysis: Secondary | ICD-10-CM | POA: Diagnosis not present

## 2022-08-04 DIAGNOSIS — I120 Hypertensive chronic kidney disease with stage 5 chronic kidney disease or end stage renal disease: Secondary | ICD-10-CM | POA: Diagnosis present

## 2022-08-04 DIAGNOSIS — Z888 Allergy status to other drugs, medicaments and biological substances status: Secondary | ICD-10-CM

## 2022-08-04 LAB — CBC WITH DIFFERENTIAL/PLATELET
Abs Immature Granulocytes: 0.03 10*3/uL (ref 0.00–0.07)
Basophils Absolute: 0.1 10*3/uL (ref 0.0–0.1)
Basophils Relative: 1 %
Eosinophils Absolute: 0.5 10*3/uL (ref 0.0–0.5)
Eosinophils Relative: 4 %
HCT: 49.1 % — ABNORMAL HIGH (ref 36.0–46.0)
Hemoglobin: 14.4 g/dL (ref 12.0–15.0)
Immature Granulocytes: 0 %
Lymphocytes Relative: 18 %
Lymphs Abs: 2.1 10*3/uL (ref 0.7–4.0)
MCH: 28.5 pg (ref 26.0–34.0)
MCHC: 29.3 g/dL — ABNORMAL LOW (ref 30.0–36.0)
MCV: 97 fL (ref 80.0–100.0)
Monocytes Absolute: 0.8 10*3/uL (ref 0.1–1.0)
Monocytes Relative: 7 %
Neutro Abs: 8 10*3/uL — ABNORMAL HIGH (ref 1.7–7.7)
Neutrophils Relative %: 70 %
Platelets: 272 10*3/uL (ref 150–400)
RBC: 5.06 MIL/uL (ref 3.87–5.11)
RDW: 17.4 % — ABNORMAL HIGH (ref 11.5–15.5)
WBC: 11.5 10*3/uL — ABNORMAL HIGH (ref 4.0–10.5)
nRBC: 0 % (ref 0.0–0.2)

## 2022-08-04 LAB — PHOSPHORUS: Phosphorus: 5.3 mg/dL — ABNORMAL HIGH (ref 2.5–4.6)

## 2022-08-04 LAB — I-STAT VENOUS BLOOD GAS, ED
Acid-base deficit: 5 mmol/L — ABNORMAL HIGH (ref 0.0–2.0)
Bicarbonate: 22.2 mmol/L (ref 20.0–28.0)
Calcium, Ion: 0.64 mmol/L — CL (ref 1.15–1.40)
HCT: 47 % — ABNORMAL HIGH (ref 36.0–46.0)
Hemoglobin: 16 g/dL — ABNORMAL HIGH (ref 12.0–15.0)
O2 Saturation: 93 %
Potassium: 5.2 mmol/L — ABNORMAL HIGH (ref 3.5–5.1)
Sodium: 133 mmol/L — ABNORMAL LOW (ref 135–145)
TCO2: 24 mmol/L (ref 22–32)
pCO2, Ven: 45.7 mmHg (ref 44–60)
pH, Ven: 7.294 (ref 7.25–7.43)
pO2, Ven: 74 mmHg — ABNORMAL HIGH (ref 32–45)

## 2022-08-04 LAB — BASIC METABOLIC PANEL
Anion gap: 23 — ABNORMAL HIGH (ref 5–15)
BUN: 95 mg/dL — ABNORMAL HIGH (ref 6–20)
CO2: 18 mmol/L — ABNORMAL LOW (ref 22–32)
Calcium: 5.3 mg/dL — CL (ref 8.9–10.3)
Chloride: 94 mmol/L — ABNORMAL LOW (ref 98–111)
Creatinine, Ser: 13.78 mg/dL — ABNORMAL HIGH (ref 0.44–1.00)
GFR, Estimated: 3 mL/min — ABNORMAL LOW (ref >60)
Glucose, Bld: 68 mg/dL — ABNORMAL LOW (ref 70–99)
Potassium: 6.3 mmol/L (ref 3.5–5.1)
Sodium: 135 mmol/L (ref 135–145)

## 2022-08-04 LAB — CBG MONITORING, ED: Glucose-Capillary: 124 mg/dL — ABNORMAL HIGH (ref 70–99)

## 2022-08-04 LAB — MAGNESIUM: Magnesium: 2 mg/dL (ref 1.7–2.4)

## 2022-08-04 MED ORDER — DEXTROSE 50 % IV SOLN: 1.0000 | Freq: Once | INTRAVENOUS | Status: DC

## 2022-08-04 MED ORDER — ALBUTEROL SULFATE (2.5 MG/3ML) 0.083% IN NEBU
5.0000 mg | INHALATION_SOLUTION | Freq: Once | RESPIRATORY_TRACT | Status: AC
  Administered 2022-08-04: 5 mg via RESPIRATORY_TRACT
  Filled 2022-08-04: qty 6

## 2022-08-04 MED ORDER — SODIUM CHLORIDE 0.9 % IV SOLN
4.0000 g | Freq: Four times a day (QID) | INTRAVENOUS | Status: AC
  Administered 2022-08-05 (×2): 4 g via INTRAVENOUS
  Filled 2022-08-04 (×2): qty 40

## 2022-08-04 MED ORDER — CALCITRIOL 0.5 MCG PO CAPS
1.0000 ug | ORAL_CAPSULE | Freq: Every day | ORAL | Status: DC
  Administered 2022-08-05: 1 ug via ORAL
  Filled 2022-08-04 (×3): qty 2

## 2022-08-04 MED ORDER — INSULIN ASPART 100 UNIT/ML IV SOLN: 5.0000 [IU] | Freq: Once | INTRAVENOUS | Status: DC

## 2022-08-04 MED ORDER — CALCIUM CARBONATE ANTACID 500 MG PO CHEW
800.0000 mg | CHEWABLE_TABLET | Freq: Three times a day (TID) | ORAL | Status: DC
  Administered 2022-08-05 – 2022-08-06 (×4): 800 mg via ORAL
  Filled 2022-08-04 (×4): qty 4

## 2022-08-04 MED ORDER — CALCIUM GLUCONATE-NACL 1-0.675 GM/50ML-% IV SOLN
1.0000 g | Freq: Once | INTRAVENOUS | Status: AC
  Administered 2022-08-04: 1000 mg via INTRAVENOUS
  Filled 2022-08-04: qty 50

## 2022-08-04 MED ORDER — SODIUM ZIRCONIUM CYCLOSILICATE 10 G PO PACK
30.0000 g | PACK | Freq: Once | ORAL | Status: AC
  Administered 2022-08-04: 30 g via ORAL
  Filled 2022-08-04: qty 3

## 2022-08-04 MED ORDER — SODIUM BICARBONATE 8.4 % IV SOLN
50.0000 meq | Freq: Once | INTRAVENOUS | Status: AC
  Administered 2022-08-04: 50 meq via INTRAVENOUS
  Filled 2022-08-04: qty 50

## 2022-08-04 MED ORDER — CHLORHEXIDINE GLUCONATE CLOTH 2 % EX PADS: 6.0000 | MEDICATED_PAD | Freq: Every day | CUTANEOUS | Status: DC

## 2022-08-04 NOTE — ED Notes (Signed)
Patient was called on the phone by this Probation officer. Patient states after waiting for four hours she left and does not want to be seen.

## 2022-08-04 NOTE — ED Notes (Signed)
PA Harris notified of Calcium and K+

## 2022-08-04 NOTE — ED Triage Notes (Signed)
Pt arrives to ED POV c/o SHOB x2 days. Per Pt she missed dialysis yesterday due to no available spots. Dialysis Tues Thurs Sat. Pt A/O x4.

## 2022-08-04 NOTE — ED Provider Triage Note (Signed)
Emergency Medicine Provider Triage Evaluation Note  Sandy [name] , a 46 y.o. female  was evaluated in triage.  Pt complains of shortness of breath.  She is here from out of town.  Tuesday, Thursday, Sat dialysis.  Missed yesterday because did not have a bed for her.  She actually needed dialysis on extra day last week, T/W/R/S  due to fluid overload.  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  States she typically needs IV calcium during dialysis.  She takes Tums daily.  No chest pain.  She does not make any urine.  Left upper extremity graft  Review of Systems  Positive: SOB, needs dialysis Negative: Fever, CP  Physical Exam  There were no vitals taken for this visit. Gen:   Awake, no distress   Resp:  Normal effort  MSK:   Moves extremities without difficulty< LUE graft Other:    Medical Decision Making  Medically screening exam initiated at 3:14 PM.  Appropriate orders placed.  Sandy [name] was informed that the remainder of the evaluation will be completed by another provider, this initial triage assessment does not replace that evaluation, and the importance of remaining in the ED until their evaluation is complete.  SOB, Needs dialysis   Sandy [name] A, PA-C 08/04/22 1517

## 2022-08-04 NOTE — ED Notes (Signed)
Pt returns to ED. Provider at bedside for [place] IV. Family with pt.

## 2022-08-04 NOTE — Consult Note (Signed)
Renal Service Consult Note [facility]  Sandy [name] 08/04/2022 Sol Blazing, MD Requesting Physician: Dr. Darl Householder  Reason for Consult: ESRD pt moving to [place] and needs dialysis HPI: The patient is a 46 y.o. year-old w/ PMH as below who presents to ED due to SOB x 2 days. Pt is from out of town, and is moving to [place] and will be living with her sister. She could not get dialysis at the local HD unit in [place] so she came to the ED.  In ED K+ is 6.3, BUN 95, creat 13.7, CO2 18  Na 135.  WBC 11.5, Hb 14. CXR looks okay but read as "trace pulm edema". Ca is low at 5.3. Pt was sp parathyroidectomy this summer and has had issues w/ low Ca++'s since that surgery.  Pt to be admitted. We are asked to see her for dialysis.   Pt seen in ED.  Pt is from [place], moving here to live w/ her sister. Started HD in 2018, TTS schedule. Last HD was last Thursday. Also had parathyroidectomy done this summer. Since then they have been having issues w/ hypocalcemia frequently.  They have been giving her IV Ca infusions during every HD lately w/ 4 gm Ca per HD session. She is not taking any CaCO3 at home, and might not be taking rocaltrol as well. She said something about insurance coverage. She is minimally SOB, no prod cough or CP. She wanted to have HD tonight, but I told her that the night-time/ weekend dialysis is only for emergencies and we don't call in our on-call staff unless there is a true emergency (e.g., K+ > 6.8, vol overload on bipap).   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)   Past Surgical History  Past Surgical History:  Procedure Laterality Date   AV FISTULA PLACEMENT Left  11/24/2015   Procedure: ARTERIOVENOUS (AV) FISTULA CREATION-LEFT UPPER ARM;  Surgeon: Rosetta Posner, MD;  Location: [facility] OR;  Service: Vascular;  Laterality: Left;   AV FISTULA PLACEMENT Left 01/15/2016   Procedure: Embolectomy of Left Arm Fistula with revision of Brachiocephalic fistula and vein patch angioplasty;  Surgeon: Rosetta Posner, MD;  Location: [place];  Service: Vascular;  Laterality: Left;   AV FISTULA PLACEMENT Right 01/17/2016   Procedure: Right Arm Brachial ARTERIOVENOUS FISTULA CREATION ;  Surgeon: Rosetta Posner, MD;  Location: [place];  Service: Vascular;  Laterality: Right;   CESAREAN SECTION     INSERTION OF DIALYSIS CATHETER Right 01/08/2016   Procedure: INSERTION OF DIALYSIS CATHETER L;  Surgeon: Rosetta Posner, MD;  Location: [facility] OR;  Service: Vascular;  Laterality: Right;   RENAL BIOPSY     Family History  Family History  Problem Relation Age of Onset   Diabetes Father    Social History  reports that she quit smoking about 6 years ago. Her smoking use included cigarettes. She smoked an average of .25 packs per day. She has never used smokeless tobacco. She reports that she does not drink alcohol and does not use drugs. Allergies  Allergies  Allergen Reactions   Lisinopril Cough  Home medications Prior to Admission medications   Medication Sig Start Date End Date Taking? Authorizing Provider  acetaminophen (TYLENOL) 500 MG tablet Take 500 mg by mouth every 6 (six) hours as needed for moderate pain.    [provider]  febuxostat (ULORIC) 40 MG tablet Take 1 tablet (40 mg total) by mouth daily. 01/22/16   Elgergawy, Silver Huguenin, MD  hydrALAZINE (APRESOLINE) 25 MG tablet Take 3 tablets (75 mg total) by mouth every 8 (eight) hours. 01/22/16   Elgergawy, Silver Huguenin, MD  labetalol (NORMODYNE) 300 MG tablet Take 1 tablet (300 mg total) by mouth 2 (two) times daily. 01/22/16   Elgergawy, Silver Huguenin, MD  oxyCODONE (OXY IR/ROXICODONE) 5 MG immediate release tablet Take 1-2 tablets (5-10 mg  total) by mouth every 6 (six) hours as needed for severe pain or breakthrough pain. 01/22/16   Elgergawy, Silver Huguenin, MD  predniSONE (DELTASONE) 2.5 MG tablet Take 1 tablet (2.5 mg total) by mouth daily with breakfast. 01/22/16   Elgergawy, Silver Huguenin, MD  sevelamer carbonate (RENVELA) 800 MG tablet Take 2 tablets (1,600 mg total) by mouth 3 (three) times daily with meals. 01/22/16   Elgergawy, Silver Huguenin, MD  vancomycin (VANCOCIN) 1-5 GM/200ML-% SOLN Inject 200 mLs (1,000 mg total) into the vein every Monday, Wednesday, and Friday with hemodialysis. [name] to continue during hemodialysis for 1 week for next Wednesday, Friday and Monday, then stop. 01/22/16   Elgergawy, Silver Huguenin, MD     Vitals:   08/04/22 1516 08/04/22 1827 08/04/22 2140 08/04/22 2151  BP: 106/68 95/62  106/69  Pulse: 92 91 (!) 106 99  Resp: 16 19 18 17   Temp: 98.2 F (36.8 C) 98.2 F (36.8 C)    SpO2: 97% 95% 96% 99%   Exam Gen alert, no distress No rash, cyanosis or gangrene Sclera anicteric, throat clear  No muscle twitching or fasciculations No jvd or bruits Chest clear bilat to bases, no rales/ wheezing RRR no MRG Abd soft ntnd no mass or ascites +bs GU defer MS no joint effusions or deformity Ext mild nonpitting edema bilat pretib, no wounds or ulcers Neuro is alert, Ox 3 , nf    LUA aVF medial aspect, +bruit/ thrill     Home meds include - uloric, hydralazine75 tid, labetalol 300 bid, oxycodone, prednisone, sevelamer 2 ac tid, prns/ vits/ supps   OP HD: TTS in [place]     Na 135  K 6.3 > 5.2   CO2 18  BUN 95  creat 13.7  Ca 5.3 phos 5.3    AG 23    WBC 11k  Hb 14    CXR - trace edema by official reading, not much fluid to my eye  Assessment/ Plan: Hyperkalemia - sp temporizing measures in ED. Will plan HD 1st shift Monday morning, using 1K bath to get K+ down.  ESRD - on HD TTS in [place], but states she is planning on staying here in [place]. Will consult our SW for CLIP to a local HD unit. HD tomorrow off  schedule Hypocalcemia - this is post PTX done this summer. Will start rocaltrol 1.0 ug daily, and start Tums 800 mg CaCO3 tid between meals. Also will give 4 gm Ca gluconate q 6 x 2 overnight. She is minimally symptomatic from the low Ca it appears.  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 Will see how Ca+ levels do.  Rob Ancelmo Hunt  MD 08/04/2022, 11:26 PM Recent Labs  Lab 08/04/22 1533 08/04/22 2205 08/04/22 2301  HGB 14.4  --  16.0*  CALCIUM 5.3*  --   --   PHOS  --  5.3*  --   CREATININE 13.78*  --   --   K 6.3*  --  5.2*   Inpatient medications:   calcium gluconate

## 2022-08-04 NOTE — ED Provider Notes (Signed)
[facility] EMERGENCY DEPARTMENT Provider Note   CSN: [id number] Arrival date & time: 08/04/22  1503     History {Add pertinent medical, surgical, social history, OB history to HPI:1} Chief Complaint  Patient presents with   Shortness of Breath    Sandy [name] is a 46 year old female with history of ESRD on Tu/Th/Sat dialysis, obesity, cardiac arrest, hypocalcemia who presents to the ED for shortness of breath. She missed dialysis yesterday because they did not have a bed for her, she waited at [facility] long emergency department all day but eventually went home.she additionally reports that she needed an extra session of HD last week (Wednesday) for volume overload. Her dry weight is 68 kg. She has a history of chronic hypocalcemia from prior thyroidectomy and states that she typically needs calcium given during dialysis. She denies chest pain. She makes no urine.   The history is provided by the patient and medical records.  Shortness of Breath      Home Medications Prior to Admission medications   Medication Sig Start Date End Date Taking? Authorizing Provider  acetaminophen (TYLENOL) 500 MG tablet Take 500 mg by mouth every 6 (six) hours as needed for moderate pain.    [provider]  febuxostat (ULORIC) 40 MG tablet Take 1 tablet (40 mg total) by mouth daily. 01/22/16   Elgergawy, Silver Huguenin, MD  hydrALAZINE (APRESOLINE) 25 MG tablet Take 3 tablets (75 mg total) by mouth every 8 (eight) hours. 01/22/16   Elgergawy, Silver Huguenin, MD  labetalol (NORMODYNE) 300 MG tablet Take 1 tablet (300 mg total) by mouth 2 (two) times daily. 01/22/16   Elgergawy, Silver Huguenin, MD  oxyCODONE (OXY IR/ROXICODONE) 5 MG immediate release tablet Take 1-2 tablets (5-10 mg total) by mouth every 6 (six) hours as needed for severe pain or breakthrough pain. 01/22/16   Elgergawy, Silver Huguenin, MD  predniSONE (DELTASONE) 2.5 MG tablet Take 1 tablet (2.5 mg total) by mouth daily with breakfast. 01/22/16    Elgergawy, Silver Huguenin, MD  sevelamer carbonate (RENVELA) 800 MG tablet Take 2 tablets (1,600 mg total) by mouth 3 (three) times daily with meals. 01/22/16   Elgergawy, Silver Huguenin, MD  vancomycin (VANCOCIN) 1-5 GM/200ML-% SOLN Inject 200 mLs (1,000 mg total) into the vein every Monday, Wednesday, and Friday with hemodialysis. [name] to continue during hemodialysis for 1 week for next Wednesday, Friday and Monday, then stop. 01/22/16   Elgergawy, Silver Huguenin, MD      Allergies    Lisinopril    Review of Systems   Review of Systems  Respiratory:  Positive for shortness of breath.     Physical Exam Updated Vital Signs BP 95/62 (BP Location: Right Arm)   Pulse 91   Temp 98.2 F (36.8 C)   Resp 19   SpO2 95%   Physical Exam Vitals reviewed.  Constitutional:      General: She is not in acute distress. HENT:     Head: Normocephalic and atraumatic.  Eyes:     Extraocular Movements: Extraocular movements intact.  Cardiovascular:     Rate and Rhythm: Regular rhythm. Tachycardia present.  Pulmonary:     Effort: Pulmonary effort is normal.     Comments: Very slightly diminished in bilateral bases.  Otherwise no appreciable crackles or wheezes. Abdominal:     Palpations: Abdomen is soft.     Tenderness: There is no abdominal tenderness.  Musculoskeletal:        General: Normal range of motion.  Cervical back: Normal range of motion.     Right lower leg: Edema present.     Left lower leg: Edema present.     Comments: 1+ bilateral lower extremity edema.  Skin:    General: Skin is warm and dry.  Neurological:     General: No focal deficit present.     Mental Status: She is alert and oriented to person, place, and time.     ED Results / Procedures / Treatments   Labs (all labs ordered are listed, but only abnormal results are displayed) Labs Reviewed  CBC WITH DIFFERENTIAL/PLATELET - Abnormal; Notable for the following components:      Result Value   WBC 11.5 (*)    HCT 49.1 (*)     MCHC 29.3 (*)    RDW 17.4 (*)    Neutro Abs 8.0 (*)    All other components within normal limits  BASIC METABOLIC PANEL - Abnormal; Notable for the following components:   Potassium 6.3 (*)    Chloride 94 (*)    CO2 18 (*)    Glucose, Bld 68 (*)    BUN 95 (*)    Creatinine, Ser 13.78 (*)    Calcium 5.3 (*)    GFR, Estimated 3 (*)    Anion gap 23 (*)    All other components within normal limits    EKG None  Radiology DG Chest 2 View  Result Date: 08/04/2022 CLINICAL DATA:  Shortness of breath, dialysis patient EXAM: CHEST - 2 VIEW COMPARISON:  Chest x-ray August 03, 2022 FINDINGS: The cardiomediastinal silhouette is unchanged in contour. Minimal basilar predominant reticular pulmonary opacities. No pleural effusion or pneumothorax. The visualized upper abdomen is unremarkable. No acute osseous abnormality. Partially visualized vascular stent in the right upper extremity. IMPRESSION: Trace pulmonary edema. Electronically Signed   By: Beryle Flock M.D.   On: 08/04/2022 15:53   DG Chest 1 View  Result Date: 08/03/2022 CLINICAL DATA:  Shortness of breath, weakness EXAM: CHEST  1 VIEW COMPARISON:  Chest x-ray January 19, 2016 FINDINGS: The cardiomediastinal silhouette is unchanged in contour. No focal pulmonary opacity. No pleural effusion or pneumothorax. The visualized upper abdomen is unremarkable. No acute osseous abnormality. Vascular stent projecting over the right axilla. IMPRESSION: No acute cardiopulmonary abnormality. Electronically Signed   By: Beryle Flock M.D.   On: 08/03/2022 16:40    Procedures Ultrasound ED Peripheral IV (Provider)  Date/Time: 08/04/2022 9:50 PM  Performed by: Renard Matter, MD Authorized by: Drenda Freeze, MD   Procedure details:    Indications: poor IV access     Skin Prep: chlorhexidine gluconate     Location:  Right AC   Angiocath:  20 G   Bedside Ultrasound Guided: Yes     Images: not archived     Patient tolerated  procedure without complications: Yes     Dressing applied: Yes     {Document cardiac monitor, telemetry assessment procedure when appropriate:1}  Medications Ordered in ED Medications - No data to display  ED Course/ Medical Decision Making/ A&P Clinical Course as of 08/04/22 2203  [facility] Aug 04, 2022  2159 Spoke with Nephrology Dr.  [DG]    Clinical Course User Index [DG] Renard Matter, MD                           Medical Decision Making Amount and/or Complexity of Data Reviewed Labs: ordered.  Risk OTC drugs. Prescription drug management.  Sandy [name] is a 46 year old female with history of ESRD on Tu/Th/Sat dialysis, obesity, cardiac arrest, hypocalcemia who presents to the ED for shortness of breath in the setting of fluid overload last week requiring additional dialysis session, and missed dialysis yesterday.   Patient initially left emergency department prior to being bedded due to long wait, however given concerning potassium level and EKG findings consistent with hyperkalemia, I called the patient on her home phone number, her spouse's phone, and her sister's phone, and was finally able to reach them after leaving several messages and reattempting, and patient agreed to come back to the ED.  On exam she has very slightly decreased breath sounds in bilateral bases, consistent with possible pleural effusion.  Physical exam also notable for bilateral lower extremity edema.  She is otherwise well-appearing.  Initial concern highest for pulmonary edema in the setting of ESRD with missed dialysis. Patient has no recent fevers or cough to suggest pneumonia, however will further evaluate on chest x-ray.  No wheezing or history of COPD to suggest reactive airway disease.  No rash or known significant allergies to suggest anaphylaxis.  Patient denies chest pain, low suspicion for ACS.  EKG on my independent review shows peaked T waves concerning for hyperkalemia and triage labs  shows K 6.3. QRS is narrow. Patient immediately ordered for calcium gluconate. Ca low at 5.3.   Spoke with Dr. Jonnie Finner of nephrology, plans to dialyze in the morning, recommended temporizing measures until morning.  Patient started on Lokelma and albuterol in ED.  Given low glucose of 68, insulin not initiated at this time.   {Document critical care time when appropriate:1} {Document review of labs and clinical decision tools ie heart score, Chads2Vasc2 etc:1}  {Document your independent review of radiology images, and any outside records:1} {Document your discussion with family members, caretakers, and with consultants:1} {Document social determinants of health affecting pt's care:1} {Document your decision making why or why not admission, treatments were needed:1} Final Clinical Impression(s) / ED Diagnoses Final diagnoses:  None    Rx / DC Orders ED Discharge Orders     None

## 2022-08-05 ENCOUNTER — Encounter (HOSPITAL_COMMUNITY): Payer: Self-pay | Admitting: Internal Medicine

## 2022-08-05 DIAGNOSIS — Z9089 Acquired absence of other organs: Secondary | ICD-10-CM

## 2022-08-05 DIAGNOSIS — E669 Obesity, unspecified: Secondary | ICD-10-CM | POA: Diagnosis present

## 2022-08-05 DIAGNOSIS — N186 End stage renal disease: Secondary | ICD-10-CM

## 2022-08-05 DIAGNOSIS — Z79899 Other long term (current) drug therapy: Secondary | ICD-10-CM | POA: Diagnosis not present

## 2022-08-05 DIAGNOSIS — Z888 Allergy status to other drugs, medicaments and biological substances status: Secondary | ICD-10-CM | POA: Diagnosis not present

## 2022-08-05 DIAGNOSIS — Z6834 Body mass index (BMI) 34.0-34.9, adult: Secondary | ICD-10-CM | POA: Diagnosis not present

## 2022-08-05 DIAGNOSIS — D72829 Elevated white blood cell count, unspecified: Secondary | ICD-10-CM | POA: Diagnosis present

## 2022-08-05 DIAGNOSIS — E892 Postprocedural hypoparathyroidism: Secondary | ICD-10-CM | POA: Diagnosis present

## 2022-08-05 DIAGNOSIS — E875 Hyperkalemia: Secondary | ICD-10-CM | POA: Diagnosis present

## 2022-08-05 DIAGNOSIS — E872 Acidosis, unspecified: Secondary | ICD-10-CM | POA: Diagnosis present

## 2022-08-05 DIAGNOSIS — Z7952 Long term (current) use of systemic steroids: Secondary | ICD-10-CM | POA: Diagnosis not present

## 2022-08-05 DIAGNOSIS — I120 Hypertensive chronic kidney disease with stage 5 chronic kidney disease or end stage renal disease: Secondary | ICD-10-CM | POA: Diagnosis present

## 2022-08-05 DIAGNOSIS — E8770 Fluid overload, unspecified: Secondary | ICD-10-CM | POA: Diagnosis present

## 2022-08-05 DIAGNOSIS — Z992 Dependence on renal dialysis: Secondary | ICD-10-CM | POA: Diagnosis not present

## 2022-08-05 DIAGNOSIS — Z79891 Long term (current) use of opiate analgesic: Secondary | ICD-10-CM | POA: Diagnosis not present

## 2022-08-05 DIAGNOSIS — R0602 Shortness of breath: Secondary | ICD-10-CM | POA: Diagnosis present

## 2022-08-05 DIAGNOSIS — I953 Hypotension of hemodialysis: Secondary | ICD-10-CM | POA: Diagnosis not present

## 2022-08-05 DIAGNOSIS — E8889 Other specified metabolic disorders: Secondary | ICD-10-CM | POA: Diagnosis present

## 2022-08-05 DIAGNOSIS — M545 Low back pain, unspecified: Secondary | ICD-10-CM | POA: Diagnosis present

## 2022-08-05 DIAGNOSIS — Z8674 Personal history of sudden cardiac arrest: Secondary | ICD-10-CM | POA: Diagnosis not present

## 2022-08-05 DIAGNOSIS — D631 Anemia in chronic kidney disease: Secondary | ICD-10-CM | POA: Diagnosis present

## 2022-08-05 DIAGNOSIS — G8929 Other chronic pain: Secondary | ICD-10-CM | POA: Diagnosis present

## 2022-08-05 DIAGNOSIS — J810 Acute pulmonary edema: Secondary | ICD-10-CM | POA: Diagnosis present

## 2022-08-05 DIAGNOSIS — Z87891 Personal history of nicotine dependence: Secondary | ICD-10-CM | POA: Diagnosis not present

## 2022-08-05 HISTORY — DX: Acquired absence of other organs: Z90.89

## 2022-08-05 LAB — COMPREHENSIVE METABOLIC PANEL
ALT: 8 U/L (ref 0–44)
AST: 8 U/L — ABNORMAL LOW (ref 15–41)
Albumin: 3.5 g/dL (ref 3.5–5.0)
Alkaline Phosphatase: 192 U/L — ABNORMAL HIGH (ref 38–126)
Anion gap: 22 — ABNORMAL HIGH (ref 5–15)
BUN: 101 mg/dL — ABNORMAL HIGH (ref 6–20)
CO2: 21 mmol/L — ABNORMAL LOW (ref 22–32)
Calcium: 5.3 mg/dL — CL (ref 8.9–10.3)
Chloride: 93 mmol/L — ABNORMAL LOW (ref 98–111)
Creatinine, Ser: 14.18 mg/dL — ABNORMAL HIGH (ref 0.44–1.00)
GFR, Estimated: 3 mL/min — ABNORMAL LOW (ref >60)
Glucose, Bld: 112 mg/dL — ABNORMAL HIGH (ref 70–99)
Potassium: 5.1 mmol/L (ref 3.5–5.1)
Sodium: 136 mmol/L (ref 135–145)
Total Bilirubin: 0.3 mg/dL (ref 0.3–1.2)
Total Protein: 7.3 g/dL (ref 6.5–8.1)

## 2022-08-05 LAB — RENAL FUNCTION PANEL
Albumin: 3.3 g/dL — ABNORMAL LOW (ref 3.5–5.0)
Anion gap: 21 — ABNORMAL HIGH (ref 5–15)
BUN: 109 mg/dL — ABNORMAL HIGH (ref 6–20)
CO2: 21 mmol/L — ABNORMAL LOW (ref 22–32)
Calcium: 6 mg/dL — CL (ref 8.9–10.3)
Chloride: 94 mmol/L — ABNORMAL LOW (ref 98–111)
Creatinine, Ser: 14.5 mg/dL — ABNORMAL HIGH (ref 0.44–1.00)
GFR, Estimated: 3 mL/min — ABNORMAL LOW (ref >60)
Glucose, Bld: 86 mg/dL (ref 70–99)
Phosphorus: 5.7 mg/dL — ABNORMAL HIGH (ref 2.5–4.6)
Potassium: 4.6 mmol/L (ref 3.5–5.1)
Sodium: 136 mmol/L (ref 135–145)

## 2022-08-05 LAB — CBC
HCT: 42.2 % (ref 36.0–46.0)
Hemoglobin: 13 g/dL (ref 12.0–15.0)
MCH: 29.1 pg (ref 26.0–34.0)
MCHC: 30.8 g/dL (ref 30.0–36.0)
MCV: 94.6 fL (ref 80.0–100.0)
Platelets: 253 K/uL (ref 150–400)
RBC: 4.46 MIL/uL (ref 3.87–5.11)
RDW: 17.3 % — ABNORMAL HIGH (ref 11.5–15.5)
WBC: 10.8 K/uL — ABNORMAL HIGH (ref 4.0–10.5)
nRBC: 0 % (ref 0.0–0.2)

## 2022-08-05 LAB — HEPATITIS B SURFACE ANTIGEN: Hepatitis B Surface Ag: NONREACTIVE

## 2022-08-05 LAB — HIV ANTIBODY (ROUTINE TESTING W REFLEX): HIV Screen 4th Generation wRfx: NONREACTIVE

## 2022-08-05 MED ORDER — HEPARIN SODIUM (PORCINE) 1000 UNIT/ML IJ SOLN
INTRAMUSCULAR | Status: AC
  Administered 2022-08-05: 2000 [IU]
  Filled 2022-08-05: qty 4

## 2022-08-05 MED ORDER — MIDODRINE HCL 5 MG PO TABS
10.0000 mg | ORAL_TABLET | ORAL | Status: DC
  Administered 2022-08-06 – 2022-08-08 (×2): 10 mg via ORAL
  Filled 2022-08-05 (×2): qty 2

## 2022-08-05 MED ORDER — HEPARIN SODIUM (PORCINE) 1000 UNIT/ML DIALYSIS
2000.0000 [IU] | INTRAMUSCULAR | Status: DC | PRN
  Administered 2022-08-05: 2000 [IU] via INTRAVENOUS_CENTRAL

## 2022-08-05 MED ORDER — MIDODRINE HCL 5 MG PO TABS
10.0000 mg | ORAL_TABLET | ORAL | Status: DC
  Administered 2022-08-05: 10 mg via ORAL
  Filled 2022-08-05: qty 2

## 2022-08-05 MED ORDER — HEPARIN SODIUM (PORCINE) 5000 UNIT/ML IJ SOLN
5000.0000 [IU] | Freq: Three times a day (TID) | INTRAMUSCULAR | Status: DC
  Administered 2022-08-05 – 2022-08-08 (×7): 5000 [IU] via SUBCUTANEOUS
  Filled 2022-08-05 (×5): qty 1

## 2022-08-05 MED ORDER — PATIROMER SORBITEX CALCIUM 8.4 G PO PACK: 1.0000 | PACK | Freq: Every day | ORAL | Status: DC | PRN

## 2022-08-05 MED ORDER — OXYCODONE-ACETAMINOPHEN 5-325 MG PO TABS
1.0000 | ORAL_TABLET | Freq: Two times a day (BID) | ORAL | Status: DC | PRN
  Administered 2022-08-05 – 2022-08-08 (×6): 2 via ORAL
  Filled 2022-08-05 (×6): qty 2

## 2022-08-05 MED ORDER — ACETAMINOPHEN 650 MG RE SUPP: 650.0000 mg | Freq: Four times a day (QID) | RECTAL | Status: DC | PRN

## 2022-08-05 MED ORDER — ACETAMINOPHEN 325 MG PO TABS: 650.0000 mg | ORAL_TABLET | Freq: Four times a day (QID) | ORAL | Status: DC | PRN

## 2022-08-05 NOTE — ED Notes (Signed)
Patient transported to dialysis at this time.

## 2022-08-05 NOTE — Progress Notes (Signed)
Patient's BP dropped to 84/58. Per provider advice Phillis Knack) UF was turned off.     08/05/22 1240  Vitals  BP (!) 84/58  MAP (mmHg) 66  BP Location Left Arm  BP Method Automatic  Patient Position (if appropriate) Lying  Pulse Rate (!) 107  Resp 12  Oxygen Therapy  SpO2 94 %  O2 Device Room Air  During Treatment Monitoring  Blood Flow Rate (mL/min) 400 mL/min  Arterial Pressure (mmHg) -210 mmHg  Venous Pressure (mmHg) 360 mmHg  TMP (mmHg) -13 mmHg  Ultrafiltration Rate (mL/min) 0 mL/min  Dialysate Flow Rate (mL/min) 300 ml/min  HD Safety Checks Performed Yes  Intra-Hemodialysis Comments See progress note (BP dropped, UF turned off.)

## 2022-08-05 NOTE — H&P (Signed)
History and Physical    Sandy [name] YQI:[id number] DOB: [birth year] DOA: 08/04/2022  PCP: Patient, No Pcp Per  Patient coming from: Home  Chief Complaint: Shortness of breath  HPI: Sandy [name] is a 46 y.o. female with medical history significant of ESRD on HD TTS, iatrogenic hypoparathyroidism status post parathyroidectomy, history of cardiac arrest, hypertension, anemia of chronic kidney disease, obesity.  She was recently admitted to [facility] in [place] 11/8-11/9 for worsening dyspnea/fluid overload and vomiting.  Her calcium was less than 5, potassium 5.3, creatinine 12.6.  She was given IV calcium and was dialyzed.  Patient presents to the ED for shortness of breath/fluid overload and missed dialysis yesterday.  Vital signs stable on arrival.  Labs showing WBC 11.5, potassium 6.3, chloride 94, bicarb 18, anion gap 23, glucose 68, calcium 5.3. VBG showing normal pH. EKG showing peaked T waves.  Chest x-ray showing trace pulmonary edema. ED resident discussed the case with Dr. Jonnie Finner with nephrology who recommended medical treatment for hyperkalemia and plan to dialyze in the morning.  Patient was given albuterol, sodium bicarb, Lokelma, and calcium gluconate 2 gm.  Repeat labs showing K 5.1, bicarb 21, glucose 112, calcium 5.3. Nephrology placed orders for additional IV and oral calcium. TRH called to admit.   Patient reports history of ESRD on HD TTS.  Reports history of parathyroidectomy this past summer.  She is from [place] and was recently admitted to [facility] for symptoms including vomiting and shortness of breath.  There she was told that her calcium was too low and she received dialysis last week on Tuesday, Wednesday, and Thursday.  After being discharged from the hospital, she came to [facility] to stay with her sister.  She was due for dialysis on Saturday but the dialysis center here told her that they did not have any chairs.  She is no longer having any vomiting or GI  symptoms but for the past 2 days having shortness of breath.  Reports history of a cold 2 weeks ago and cough has improved since then.  Denies chest pain.  Review of Systems:  Review of Systems  All other systems reviewed and are negative.   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)    Past Surgical History:  Procedure Laterality Date   AV FISTULA PLACEMENT Left 11/24/2015   Procedure: ARTERIOVENOUS (AV) FISTULA CREATION-LEFT UPPER ARM;  Surgeon: Rosetta Posner, MD;  Location: [facility] OR;  Service: Vascular;  Laterality: Left;   AV FISTULA PLACEMENT Left 01/15/2016   Procedure: Embolectomy of Left Arm Fistula with revision of Brachiocephalic fistula and vein patch angioplasty;  Surgeon: Rosetta Posner, MD;  Location: [place];  Service: Vascular;  Laterality: Left;   AV FISTULA PLACEMENT Right 01/17/2016   Procedure: Right Arm Brachial ARTERIOVENOUS FISTULA CREATION ;  Surgeon: Rosetta Posner, MD;  Location: [place];  Service: Vascular;  Laterality: Right;   CESAREAN SECTION     INSERTION OF DIALYSIS CATHETER Right 01/08/2016   Procedure: INSERTION OF DIALYSIS CATHETER L;  Surgeon: Rosetta Posner, MD;  Location: [place];  Service: Vascular;  Laterality: Right;   RENAL BIOPSY       reports that she quit smoking about 6 years ago. Her smoking use included cigarettes. She smoked an average of .25 packs per day. She  has never used smokeless tobacco. She reports that she does not drink alcohol and does not use drugs.  Allergies  Allergen Reactions   Lisinopril Cough    Family History  Problem Relation Age of Onset   Diabetes Father     Prior to Admission medications   Medication Sig Start Date End Date Taking? Authorizing Provider  acetaminophen (TYLENOL) 500 MG tablet Take 500 mg by mouth every 6 (six) hours as needed for moderate pain.    [provider]  febuxostat (ULORIC) 40 MG tablet Take 1 tablet (40 mg total) by mouth daily. 01/22/16   Elgergawy, Silver Huguenin, MD  hydrALAZINE (APRESOLINE) 25 MG tablet Take 3 tablets (75 mg total) by mouth every 8 (eight) hours. 01/22/16   Elgergawy, Silver Huguenin, MD  labetalol (NORMODYNE) 300 MG tablet Take 1 tablet (300 mg total) by mouth 2 (two) times daily. 01/22/16   Elgergawy, Silver Huguenin, MD  oxyCODONE (OXY IR/ROXICODONE) 5 MG immediate release tablet Take 1-2 tablets (5-10 mg total) by mouth every 6 (six) hours as needed for severe pain or breakthrough pain. 01/22/16   Elgergawy, Silver Huguenin, MD  predniSONE (DELTASONE) 2.5 MG tablet Take 1 tablet (2.5 mg total) by mouth daily with breakfast. 01/22/16   Elgergawy, Silver Huguenin, MD  sevelamer carbonate (RENVELA) 800 MG tablet Take 2 tablets (1,600 mg total) by mouth 3 (three) times daily with meals. 01/22/16   Elgergawy, Silver Huguenin, MD  vancomycin (VANCOCIN) 1-5 GM/200ML-% SOLN Inject 200 mLs (1,000 mg total) into the vein every Monday, Wednesday, and Friday with hemodialysis. [name] to continue during hemodialysis for 1 week for next Wednesday, Friday and Monday, then stop. 01/22/16   Elgergawy, Silver Huguenin, MD    Physical Exam: Vitals:   08/04/22 1827 08/04/22 2140 08/04/22 2151 08/04/22 2330  BP: 95/62  106/69 104/63  Pulse: 91 (!) 106 99 96  Resp: 19 18 17 17   Temp: 98.2 F (36.8 C)     SpO2: 95% 96% 99% 100%    Physical Exam Vitals reviewed.  Constitutional:      General: She is not in acute distress. HENT:     Head: Normocephalic and atraumatic.  Eyes:     Extraocular Movements: Extraocular movements intact.  Cardiovascular:     Rate and Rhythm: Normal rate and regular rhythm.     Pulses: Normal pulses.  Pulmonary:     Effort: Pulmonary effort is normal. No respiratory distress.     Breath sounds: Normal breath sounds. No wheezing or rales.  Abdominal:     General: Bowel sounds are normal. There is no distension.     Palpations: Abdomen is soft.      Tenderness: There is no abdominal tenderness.  Musculoskeletal:     Cervical back: Normal range of motion.     Right lower leg: Edema present.     Left lower leg: Edema present.  Skin:    General: Skin is warm and dry.  Neurological:     General: No focal deficit present.     Mental Status: She is alert and oriented to person, place, and time.     Labs on Admission: I have personally reviewed following labs and imaging studies  CBC: Recent Labs  Lab 08/04/22 1533 08/04/22 2301  WBC 11.5*  --   NEUTROABS 8.0*  --   HGB 14.4 16.0*  HCT 49.1* 47.0*  MCV 97.0  --   PLT 272  --    Basic Metabolic Panel: Recent Labs  Lab 08/04/22 1533 08/04/22 2205 08/04/22 2301 08/04/22 2315  NA 135  --  133* 136  K 6.3*  --  5.2* 5.1  CL 94*  --   --  93*  CO2 18*  --   --  21*  GLUCOSE 68*  --   --  112*  BUN 95*  --   --  101*  CREATININE 13.78*  --   --  14.18*  CALCIUM 5.3*  --   --  5.3*  MG  --  2.0  --   --   PHOS  --  5.3*  --   --    GFR: CrCl cannot be calculated (Unknown ideal weight.). Liver Function Tests: Recent Labs  Lab 08/04/22 2315  AST 8*  ALT 8  ALKPHOS 192*  BILITOT 0.3  PROT 7.3  ALBUMIN 3.5   No results for input(s): "LIPASE", "AMYLASE" in the last 168 hours. No results for input(s): "AMMONIA" in the last 168 hours. Coagulation Profile: No results for input(s): "INR", "PROTIME" in the last 168 hours. Cardiac Enzymes: No results for input(s): "CKTOTAL", "CKMB", "CKMBINDEX", "TROPONINI" in the last 168 hours. BNP (last 3 results) No results for input(s): "PROBNP" in the last 8760 hours. HbA1C: No results for input(s): "HGBA1C" in the last 72 hours. CBG: Recent Labs  Lab 08/04/22 2341  GLUCAP 124*   Lipid Profile: No results for input(s): "CHOL", "HDL", "LDLCALC", "TRIG", "CHOLHDL", "LDLDIRECT" in the last 72 hours. Thyroid Function Tests: No results for input(s): "TSH", "T4TOTAL", "FREET4", "T3FREE", "THYROIDAB" in the last 72  hours. Anemia Panel: No results for input(s): "VITAMINB12", "FOLATE", "FERRITIN", "TIBC", "IRON", "RETICCTPCT" in the last 72 hours. Urine analysis:    Component Value Date/Time   COLORURINE YELLOW 10/14/2015 1604   APPEARANCEUR CLOUDY (A) 10/14/2015 1604   LABSPEC 1.027 10/14/2015 1604   PHURINE 5.5 10/14/2015 1604   GLUCOSEU NEGATIVE 10/14/2015 1604   HGBUR NEGATIVE 10/14/2015 1604   BILIRUBINUR NEGATIVE 10/14/2015 1604   KETONESUR NEGATIVE 10/14/2015 1604   PROTEINUR >300 (A) 10/14/2015 1604   NITRITE NEGATIVE 10/14/2015 1604   LEUKOCYTESUR NEGATIVE 10/14/2015 1604    Radiological Exams on Admission: DG Chest 2 View  Result Date: 08/04/2022 CLINICAL DATA:  Shortness of breath, dialysis patient EXAM: CHEST - 2 VIEW COMPARISON:  Chest x-ray August 03, 2022 FINDINGS: The cardiomediastinal silhouette is unchanged in contour. Minimal basilar predominant reticular pulmonary opacities. No pleural effusion or pneumothorax. The visualized upper abdomen is unremarkable. No acute osseous abnormality. Partially visualized vascular stent in the right upper extremity. IMPRESSION: Trace pulmonary edema. Electronically Signed   By: Beryle Flock M.D.   On: 08/04/2022 15:53   DG Chest 1 View  Result Date: 08/03/2022 CLINICAL DATA:  Shortness of breath, weakness EXAM: CHEST  1 VIEW COMPARISON:  Chest x-ray January 19, 2016 FINDINGS: The cardiomediastinal silhouette is unchanged in contour. No focal pulmonary opacity. No pleural effusion or pneumothorax. The visualized upper abdomen is unremarkable. No acute osseous abnormality. Vascular stent projecting over the right axilla. IMPRESSION: No acute cardiopulmonary abnormality. Electronically Signed   By: Beryle Flock M.D.   On: 08/03/2022 16:40    EKG: Independently reviewed. Sinus rhythm, peaked T waves, QTc 506.  Assessment and Plan  Hyperkalemia with EKG changes Severe hypocalcemia in the setting of history of parathyroidectomy Mild  metabolic acidosis Volume overload/pulmonary edema ESRD on HD TTS/ missed HD Potassium 6.3 on initial labs and EKG showing peaked T waves. Bicarb 18. Calcium 5.3. Chest x-ray showing trace pulmonary edema.  Not hypoxic. Patient received albuterol, sodium bicarb, Lokelma, and calcium gluconate 2 gm in the ED. Repeat labs showing K 5.1, bicarb 21. However, no improvement of hypocalcemia.   -Cardiac monitoring  -Nephrology consulted for dialysis and ordered additional IV and oral calcium. -Repeat EKG -Monitor labs closely -Continuous pulse ox, supplemental O2 as needed  Borderline leukocytosis Likely reactive. No infectious signs or symptoms. -Repeat CBC in a.m.  HTN Currently normotensive.  -Continue home meds after pharmacy med rec is done.  DVT prophylaxis: SQ Heparin Code Status: Full Code (discussed with the patient) Family Communication: No family at bedside. Consults called: Nephrology Level of care: Telemetry bed Admission status: It is my clinical opinion that referral for OBSERVATION is reasonable and necessary in this patient based on the above information provided. The aforementioned taken together are felt to place the patient at high risk for further clinical deterioration. However, it is anticipated that the patient may be medically stable for discharge from the hospital within 24 to 48 hours.   Shela Leff MD [facility]  If 7PM-7AM, please contact night-coverage [email or web address]  08/05/2022, 1:13 AM

## 2022-08-05 NOTE — Progress Notes (Addendum)
Received patient in bed to unit.  Alert and oriented.  Informed consent signed and in chart.   Treatment initiated: 1040 Treatment completed: 1428  Patient tolerated well.  Transported back to the room  Alert, without acute distress.  Hand-off given to patient's nurse.   Access used:  Access issues: clotted off last few minutes   Total UF removed: 2L Post HD weight: 92KG    [facility]  08/05/22 1428  Vitals  Temp 98.5 F (36.9 C)  Temp Source Oral  BP 107/72  MAP (mmHg) 80  BP Location Left Arm  BP Method Automatic  Patient Position (if appropriate) Lying  Pulse Rate (!) 102  Oxygen Therapy  SpO2 96 %  O2 Device Room Air  During Treatment Monitoring  Intra-Hemodialysis Comments Tx completed  Post Treatment  Dialyzer Clearance Lightly streaked  Duration of HD Treatment -hour(s) 3 hour(s)  Hemodialysis Intake (mL) 100 mL  Liters Processed 74.6  Fluid Removed (mL) 2000 mL  Tolerated HD Treatment No (Comment) (BP dropped)

## 2022-08-05 NOTE — Progress Notes (Addendum)
[facility] Progress Note   Subjective: Seen in [place]. On HD -Attempting 3L UF. No dyspnea/chest pain. Hypocalcemia - denies numbness/tingling.   Objective Vitals:   08/05/22 0815 08/05/22 0830 08/05/22 1018 08/05/22 1040  BP:   117/89 123/82  Pulse: 93 93 97 (!) 101  Resp: (!) 54 14 16 16   Temp:   98.5 F (36.9 C)   TempSrc:   Oral   SpO2: 97% 96% 96% 99%       Additional Objective Labs: Basic Metabolic Panel: Recent Labs  Lab 08/04/22 1533 08/04/22 2205 08/04/22 2301 08/04/22 2315 08/05/22 0841  NA 135  --  133* 136 136  K 6.3*  --  5.2* 5.1 4.6  CL 94*  --   --  93* 94*  CO2 18*  --   --  21* 21*  GLUCOSE 68*  --   --  112* 86  BUN 95*  --   --  101* 109*  CREATININE 13.78*  --   --  14.18* 14.50*  CALCIUM 5.3*  --   --  5.3* 6.0*  PHOS  --  5.3*  --   --  5.7*   CBC: Recent Labs  Lab 08/04/22 1533 08/04/22 2301 08/05/22 0841  WBC 11.5*  --  10.8*  NEUTROABS 8.0*  --   --   HGB 14.4 16.0* 13.0  HCT 49.1* 47.0* 42.2  MCV 97.0  --  94.6  PLT 272  --  253   Blood Culture    Component Value Date/Time   SDES BLOOD 01/19/2016 1020   SPECREQUEST BOTTLES DRAWN AEROBIC AND ANAEROBIC 10CC 01/19/2016 1020   CULT NO GROWTH 5 DAYS 01/19/2016 1020   REPTSTATUS 01/24/2016 FINAL 01/19/2016 1020     Physical Exam General: Well appearing, nad  Heart: RRR No m,r,g  Lungs: Clear bilaterally  Abdomen: soft non-tender Extremities: 1+ LE edema, bilaterally  Dialysis Access: L AVF +bruit   Medications:   calcitRIOL  1 mcg Oral Daily   calcium carbonate  800 mg of elemental calcium Oral TID BM   Chlorhexidine Gluconate Cloth  6 each Topical Q0600   heparin  5,000 Units Subcutaneous Q8H   midodrine  10 mg Oral Once per day on Mon Wed Fri    Background: 46 y.o. 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 Presents to ED due to SOB x 2 days. Pt is from out of town, and is moving to [place] and will be living with her sister. She could not get dialysis  at the local HD unit in [place] so she came to the ED.  In ED K+ is 6.3, BUN 95, creat 13.7, CO2 18  Na 135.  WBC 11.5, Hb 14. CXR looks okay but read as "trace pulm edema". Ca is low at 5.3. Pt was sp parathyroidectomy this summer and has had issues w/ low Ca++'s since that surgery.They have been giving her IV Ca infusions during every HD lately w/ 4 gm Ca per HD session. She is not taking any CaCO3 at home,   [facility] [place] - TTS   Assessment/Plan: Hyperkalemia - sp temporizing measures in ED. Urgent HD this am.  using 1K bath to get K+ down.  ESRD - on HD TTS in [place], but states she is planning on staying here in [place]. Will consult our SW for CLIP to a local HD unit. Renal navigator is aware. HD off schedule today. Back on schedule tomorrow.  Hypocalcemia - this is  post PTX done this summer. Will start rocaltrol 1.0 ug daily, and start Tums 800 mg CaCO3 tid between meals. Also will give 4 gm Ca gluconate q 6 x 2 overnight. She is minimally symptomatic from the low Ca it appears. Continue to trend Ca+ levels  Anemia esrd - Hb above goal,  no esa needs.  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 Will see how Ca+ levels do   Lynnda Child PA-C [facility] 08/05/2022,11:28 AM

## 2022-08-05 NOTE — Progress Notes (Signed)
46 year old female recent transplant from [place] ESRD started 2018 TTS, parathyroidectomy 04/10/2022 Schatzki ring with dysphagia status postdilatation Chronic low back pain on opiates with pain clinic  Complication of hypocalcemia intermittently with needs for IV calcium Found to have hyperkalemia potassium 6.3 calcium 5.3 CXR trace edema WBC 11  S Awake comfortable appearing BF nad Denies shortness of breath fever chills nausea vomiting   O BP 112/78   Pulse 99   Temp 97.8 F (36.6 C) (Oral)   Resp 18   SpO2 99%  EOMI NCAT no focal deficit.  Bilateral no wheeze no rales rhonchi CTA B no added sound S1-S2 no murmur no rub no gallop ROM intact moving 4 limbs equally Neurologically intact no focal deficit   Missed HD in the setting of ESRD TTS Hyperkalemia-now resolved Hypocalcemia severe - Defer to nephrology, looks like going for HD later today - Hyperkalemia was treated Lokelma x130 mg last night, will order Veltassa as per home regimen 1 packet daily - We have reordered medication which she takes 10 3 times a week Monday Wednesday Friday  Recent parathyroidectomy 04/10/2022 with resultant severe hypocalcemia - Calcium still remains very low, received 4 g of calcium gluconate and sodium chloride and I will repeat this again now, we have resumed calcium carbonate 500 3 times daily - She will probably need a plan for significant supplemental dosing as per nephrology - With next lab draw would check phosphorus  Leukocytosis - Just monitor at this time

## 2022-08-05 NOTE — Progress Notes (Signed)
Requested to see pt for out-pt HD needs at d/c. Spoke to pt via phone. Pt receives out-pt HD at [facility] clinic in [place] prior to admission. Pt has been working with [facility] to get a clinic to receive HD while here visiting. Pt states that [facility] told pt that if she will need a clinic longer than 13 treatments then pt needs to submit another type of request (permanent placement instead of transient placement). Pt states she will be in the area for over 1 month and prefers a clinic in [place] area if possible. Advised pt that there are no [facility] clinics in [place] and that [facility] has clinics in [place] and [place] if pt wants to remain with [facility]. Pt advised that [place] area has [facility] clinics only. Pt prefers to proceed with a referral to [facility] so she can go to a clinic in the [place] area and not have to travel so far. Pt also states she may be interested in transportation services at d/c if needed. Will submit referral to [facility] for review once Hep B labs have resulted. Will assist as needed.   Melven Sartorius Renal Navigator [phone]

## 2022-08-06 DIAGNOSIS — E875 Hyperkalemia: Secondary | ICD-10-CM | POA: Diagnosis not present

## 2022-08-06 LAB — RENAL FUNCTION PANEL
Albumin: 3 g/dL — ABNORMAL LOW (ref 3.5–5.0)
Anion gap: 19 — ABNORMAL HIGH (ref 5–15)
BUN: 63 mg/dL — ABNORMAL HIGH (ref 6–20)
CO2: 25 mmol/L (ref 22–32)
Calcium: 5.5 mg/dL — CL (ref 8.9–10.3)
Chloride: 97 mmol/L — ABNORMAL LOW (ref 98–111)
Creatinine, Ser: 9.21 mg/dL — ABNORMAL HIGH (ref 0.44–1.00)
GFR, Estimated: 5 mL/min — ABNORMAL LOW (ref >60)
Glucose, Bld: 88 mg/dL (ref 70–99)
Phosphorus: 4.5 mg/dL (ref 2.5–4.6)
Potassium: 4.8 mmol/L (ref 3.5–5.1)
Sodium: 141 mmol/L (ref 135–145)

## 2022-08-06 LAB — CBC
HCT: 43.1 % (ref 36.0–46.0)
Hemoglobin: 12.8 g/dL (ref 12.0–15.0)
MCH: 28.3 pg (ref 26.0–34.0)
MCHC: 29.7 g/dL — ABNORMAL LOW (ref 30.0–36.0)
MCV: 95.4 fL (ref 80.0–100.0)
Platelets: 259 10*3/uL (ref 150–400)
RBC: 4.52 MIL/uL (ref 3.87–5.11)
RDW: 17.2 % — ABNORMAL HIGH (ref 11.5–15.5)
WBC: 9.9 10*3/uL (ref 4.0–10.5)
nRBC: 0 % (ref 0.0–0.2)

## 2022-08-06 LAB — HEPATITIS B SURFACE ANTIBODY, QUANTITATIVE: Hep B S AB Quant (Post): 18.8 m[IU]/mL (ref >9.9)

## 2022-08-06 LAB — HEPATITIS PANEL, ACUTE
HCV Ab: NONREACTIVE
Hep A IgM: NONREACTIVE
Hep B C IgM: NONREACTIVE
Hepatitis B Surface Ag: NONREACTIVE

## 2022-08-06 MED ORDER — LIDOCAINE-PRILOCAINE 2.5-2.5 % EX CREA: 1.0000 | TOPICAL_CREAM | CUTANEOUS | Status: DC | PRN

## 2022-08-06 MED ORDER — CALCIUM GLUCONATE-NACL 2-0.675 GM/100ML-% IV SOLN
2.0000 g | Freq: Once | INTRAVENOUS | Status: AC
  Administered 2022-08-06: 2000 mg via INTRAVENOUS
  Filled 2022-08-06: qty 100

## 2022-08-06 MED ORDER — LIDOCAINE HCL (PF) 1 % IJ SOLN: 5.0000 mL | INTRAMUSCULAR | Status: DC | PRN

## 2022-08-06 MED ORDER — PATIROMER SORBITEX CALCIUM 8.4 G PO PACK: 1.0000 | PACK | Freq: Every day | ORAL | Status: DC | PRN

## 2022-08-06 MED ORDER — CALCITRIOL 0.5 MCG PO CAPS
2.0000 ug | ORAL_CAPSULE | Freq: Every day | ORAL | Status: DC
  Administered 2022-08-06 – 2022-08-08 (×3): 2 ug via ORAL
  Filled 2022-08-06 (×3): qty 4

## 2022-08-06 MED ORDER — GUAIFENESIN-DM 100-10 MG/5ML PO SYRP
15.0000 mL | ORAL_SOLUTION | Freq: Four times a day (QID) | ORAL | Status: DC | PRN
  Administered 2022-08-06: 15 mL via ORAL
  Filled 2022-08-06: qty 15

## 2022-08-06 MED ORDER — SODIUM CHLORIDE 0.9 % IV SOLN
4.0000 g | Freq: Once | INTRAVENOUS | Status: AC
  Administered 2022-08-06: 4 g via INTRAVENOUS
  Filled 2022-08-06: qty 40

## 2022-08-06 MED ORDER — HEPARIN SODIUM (PORCINE) 1000 UNIT/ML IJ SOLN
3000.0000 [IU] | INTRAMUSCULAR | Status: DC | PRN
  Administered 2022-08-06: 3000 [IU] via INTRAVENOUS
  Filled 2022-08-06: qty 3

## 2022-08-06 MED ORDER — PENTAFLUOROPROP-TETRAFLUOROETH EX AERO: 1.0000 | INHALATION_SPRAY | CUTANEOUS | Status: DC | PRN

## 2022-08-06 NOTE — Plan of Care (Signed)
°  Problem: Fluid Volume: °Goal: Compliance with measures to maintain balanced fluid volume will improve °Outcome: Progressing °  °Problem: Nutritional: °Goal: Ability to make healthy dietary choices will improve °Outcome: Progressing °  °

## 2022-08-06 NOTE — Progress Notes (Signed)
Received patient in bed to unit.  Alert and oriented.  Informed consent signed and in chart.   Treatment initiated: 1158 Treatment completed: 1622  Patient tolerated well. Intermittently hypotensive. Transported back to the room  Alert, without acute distress.  Hand-off given to patient's nurse.   Access used: left AVF  Access issues: high pressure   Total UF removed: 3059ml Medication(s) given: heparin Post HD VS: 98.0, 90, 16, 101/61, 94 % RA  Post HD weight: 90.3 kg    [facility]

## 2022-08-06 NOTE — Progress Notes (Signed)
PROGRESS NOTE   Sandy [name]  HWE:[id number] DOB: [birth year] DOA: 08/04/2022 PCP: Patient, No Pcp Per  Brief Narrative:    46 year old female recent transplant from [place] ESRD started 2018 TTS, parathyroidectomy 04/10/2022 Schatzki ring with dysphagia status postdilatation Chronic low back pain on opiates with pain clinic   Complication of hypocalcemia intermittently with needs for IV calcium Found to have hyperkalemia potassium 6.3 calcium 5.3 CXR trace edema WBC 11  Admitted for severe hypocalcemia missed dialysis and need for local care coordination       Hospital-Problem based course   Missed HD in the setting of ESRD TTS Hyperkalemia-now resolved Hypocalcemia severe - Defer to nephrology, looks like going for HD later today - Hyperkalemia was treated Lokelma x1 30 mg l and has resolved continue Veltassa - We have reordered medication which she takes 10 3 times a week Monday Wednesday Friday   Recent parathyroidectomy 04/10/2022 with resultant severe hypocalcemia - Calcium 5.5, received 2 more grams IV today and postdialysis will receive 4 g, will need to continue calcium carbonate 800 3 times daily meals, continue calcitriol 2 mcg daily - She usually gets infusions of calcium with dialysis and this will have to be set up as an outpatient once her calcium is resolved   Leukocytosis - Just monitor at this time--- check CBC in a.m.  DVT prophylaxis: Heparin Code Status: Full Family Communication: None Disposition:  Status is: Inpatient Remains inpatient appropriate because:   Will need stability of calcium and clip to a local dialysis center prior to be able to discharge   Consultants:  nephro  Procedures:   Antimicrobials:    Subjective:  Awake coherent no distress sitting up No fever no chills Calcium remains critically low  Objective: Vitals:   08/05/22 1749 08/05/22 2158 08/06/22 0554 08/06/22 0905  BP: 104/76 106/80 (!) 92/57 98/72   Pulse: 98 (!) 106 93 99  Resp: 16 18 16 18   Temp: 98.4 F (36.9 C) 98.4 F (36.9 C) 97.8 F (36.6 C) 97.6 F (36.4 C)  TempSrc: Oral Oral Oral Oral  SpO2: 97% 97% 95% 95%  Weight:        Intake/Output Summary (Last 24 hours) at 08/06/2022 1026 Last data filed at 08/06/2022 0900 Gross per 24 hour  Intake 580 ml  Output 2000 ml  Net -1420 ml   Filed Weights   08/05/22 1446  Weight: 92 kg    Examination:  Coherent no distress no fever no chills Chest is clear no added sound rales rhonchi Abdomen soft no rebound no guarding Mild lower extremity edema ROM intact no focal deficit  Data Reviewed: personally reviewed   CBC    Component Value Date/Time   WBC 10.8 (H) 08/05/2022 0841   RBC 4.46 08/05/2022 0841   HGB 13.0 08/05/2022 0841   HCT 42.2 08/05/2022 0841   PLT 253 08/05/2022 0841   MCV 94.6 08/05/2022 0841   MCH 29.1 08/05/2022 0841   MCHC 30.8 08/05/2022 0841   RDW 17.3 (H) 08/05/2022 0841   LYMPHSABS 2.1 08/04/2022 1533   MONOABS 0.8 08/04/2022 1533   EOSABS 0.5 08/04/2022 1533   BASOSABS 0.1 08/04/2022 1533      Latest Ref Rng & Units 08/06/2022    3:16 AM 08/05/2022    8:41 AM 08/04/2022   11:15 PM  CMP  Glucose 70 - 99 mg/dL 88  86  112   BUN 6 - 20 mg/dL 63  109  101   Creatinine 0.44 - 1.00  mg/dL 9.21  14.50  14.18   Sodium 135 - 145 mmol/L 141  136  136   Potassium 3.5 - 5.1 mmol/L 4.8  4.6  5.1   Chloride 98 - 111 mmol/L 97  94  93   CO2 22 - 32 mmol/L 25  21  21    Calcium 8.9 - 10.3 mg/dL 5.5  6.0  5.3   Total Protein 6.5 - 8.1 g/dL   7.3   Total Bilirubin 0.3 - 1.2 mg/dL   0.3   Alkaline Phos 38 - 126 U/L   192   AST 15 - 41 U/L   8   ALT 0 - 44 U/L   8      Radiology Studies: DG Chest 2 View  Result Date: 08/04/2022 CLINICAL DATA:  Shortness of breath, dialysis patient EXAM: CHEST - 2 VIEW COMPARISON:  Chest x-ray August 03, 2022 FINDINGS: The cardiomediastinal silhouette is unchanged in contour. Minimal basilar predominant  reticular pulmonary opacities. No pleural effusion or pneumothorax. The visualized upper abdomen is unremarkable. No acute osseous abnormality. Partially visualized vascular stent in the right upper extremity. IMPRESSION: Trace pulmonary edema. Electronically Signed   By: Beryle Flock M.D.   On: 08/04/2022 15:53     Scheduled Meds:  calcitRIOL  2 mcg Oral Daily   calcium carbonate  800 mg of elemental calcium Oral TID BM   Chlorhexidine Gluconate Cloth  6 each Topical Q0600   heparin  5,000 Units Subcutaneous Q8H   midodrine  10 mg Oral Q T,Th,Sa-HD   Continuous Infusions:  calcium gluconate       LOS: 1 day   Time spent: Wise, MD [facility] To contact the attending provider between 7A-7P or the covering provider during after hours 7P-7A, please log into the web site [email or web address] and access using universal [place] password for that web site. If you do not have the password, please call the hospital operator.  08/06/2022, 10:26 AM

## 2022-08-06 NOTE — Progress Notes (Signed)
[facility] Progress Note   Subjective: Seen room. Ca 5.5 this am. Not symptomatic. No numbness/tingling, no cp, palpitations. Calcium gluconate ordered - got 2g this am. Dialysis today   Objective Vitals:   08/05/22 1749 08/05/22 2158 08/06/22 0554 08/06/22 0905  BP: 104/76 106/80 (!) 92/57 98/72  Pulse: 98 (!) 106 93 99  Resp: 16 18 16 18   Temp: 98.4 F (36.9 C) 98.4 F (36.9 C) 97.8 F (36.6 C) 97.6 F (36.4 C)  TempSrc: Oral Oral Oral Oral  SpO2: 97% 97% 95% 95%  Weight:         Additional Objective Labs: Basic Metabolic Panel: Recent Labs  Lab 08/04/22 2205 08/04/22 2301 08/04/22 2315 08/05/22 0841 08/06/22 0316  NA  --    < > 136 136 141  K  --    < > 5.1 4.6 4.8  CL  --   --  93* 94* 97*  CO2  --   --  21* 21* 25  GLUCOSE  --   --  112* 86 88  BUN  --   --  101* 109* 63*  CREATININE  --   --  14.18* 14.50* 9.21*  CALCIUM  --   --  5.3* 6.0* 5.5*  PHOS 5.3*  --   --  5.7* 4.5   < > = values in this interval not displayed.    CBC: Recent Labs  Lab 08/04/22 1533 08/04/22 2301 08/05/22 0841  WBC 11.5*  --  10.8*  NEUTROABS 8.0*  --   --   HGB 14.4 16.0* 13.0  HCT 49.1* 47.0* 42.2  MCV 97.0  --  94.6  PLT 272  --  253    Blood Culture    Component Value Date/Time   SDES BLOOD 01/19/2016 1020   SPECREQUEST BOTTLES DRAWN AEROBIC AND ANAEROBIC 10CC 01/19/2016 1020   CULT NO GROWTH 5 DAYS 01/19/2016 1020   REPTSTATUS 01/24/2016 FINAL 01/19/2016 1020     Physical Exam General: Well appearing, nad  Heart: RRR No m,r,g  Lungs: Clear bilaterally  Abdomen: soft non-tender Extremities: 1+ LE edema, bilaterally  Dialysis Access: L AVF +bruit   Medications:  calcium gluconate      calcitRIOL  2 mcg Oral Daily   calcium carbonate  800 mg of elemental calcium Oral TID BM   Chlorhexidine Gluconate Cloth  6 each Topical Q0600   heparin  5,000 Units Subcutaneous Q8H   midodrine  10 mg Oral Q T,Th,Sa-HD    Background: 46 y.o.  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 Presents to ED due to SOB x 2 days. Pt is from out of town, and is moving to [place] and will be living with her sister. She could not get dialysis at the local HD unit in [place] so she came to the ED.  In ED K+ is 6.3, BUN 95, creat 13.7, CO2 18  Na 135.  WBC 11.5, Hb 14. CXR looks okay but read as "trace pulm edema". Ca is low at 5.3. Pt was sp parathyroidectomy this summer and has had issues w/ low Ca++'s since that surgery.They have been giving her IV Ca infusions during every HD lately w/ 4 gm Ca per HD session. She is not taking any CaCO3 at home,   - [facility] [place] - TTS   Assessment/Plan: ESRD on HD TTS in [place], but states she is planning on staying here in [place]. Renal navigator working on CLIP to local HD unit.  HD on schedule today.  Hyperkalemia - Resolved with HD.  Hypocalcemia - this is post PTX done this summer. Now getting 25mcg calcitriol, Tums 800 mg CaCO3 tid between meals. S/p 4 gm Ca gluconate x2. Getting another 4g today.  She is minimally symptomatic from the low Ca it appears. Continue to trend Ca+ levels  Anemia esrd - Hb above goal,  no esa needs.  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 Will see how Ca+ levels do   Lynnda Child PA-C [facility] 08/06/2022,9:42 AM

## 2022-08-06 NOTE — Progress Notes (Signed)
Referral submitted to [facility] admissions this morning for review.   Melven Sartorius Renal Navigator [phone]

## 2022-08-07 LAB — RENAL FUNCTION PANEL
Albumin: 3.3 g/dL — ABNORMAL LOW (ref 3.5–5.0)
Anion gap: 16 — ABNORMAL HIGH (ref 5–15)
BUN: 49 mg/dL — ABNORMAL HIGH (ref 6–20)
CO2: 25 mmol/L (ref 22–32)
Calcium: 6.2 mg/dL — CL (ref 8.9–10.3)
Chloride: 97 mmol/L — ABNORMAL LOW (ref 98–111)
Creatinine, Ser: 7.82 mg/dL — ABNORMAL HIGH (ref 0.44–1.00)
GFR, Estimated: 6 mL/min — ABNORMAL LOW (ref >60)
Glucose, Bld: 81 mg/dL (ref 70–99)
Phosphorus: 3 mg/dL (ref 2.5–4.6)
Potassium: 4.5 mmol/L (ref 3.5–5.1)
Sodium: 138 mmol/L (ref 135–145)

## 2022-08-07 LAB — CBC
HCT: 45.4 % (ref 36.0–46.0)
Hemoglobin: 13.6 g/dL (ref 12.0–15.0)
MCH: 28.6 pg (ref 26.0–34.0)
MCHC: 30 g/dL (ref 30.0–36.0)
MCV: 95.4 fL (ref 80.0–100.0)
Platelets: 232 10*3/uL (ref 150–400)
RBC: 4.76 MIL/uL (ref 3.87–5.11)
RDW: 17.2 % — ABNORMAL HIGH (ref 11.5–15.5)
WBC: 7.7 10*3/uL (ref 4.0–10.5)
nRBC: 0 % (ref 0.0–0.2)

## 2022-08-07 LAB — CALCIUM: Calcium: 6.3 mg/dL — CL (ref 8.9–10.3)

## 2022-08-07 MED ORDER — CALCIUM CARBONATE ANTACID 500 MG PO CHEW
1200.0000 mg | CHEWABLE_TABLET | Freq: Three times a day (TID) | ORAL | Status: DC
  Administered 2022-08-07 (×2): 1200 mg via ORAL
  Filled 2022-08-07 (×2): qty 6

## 2022-08-07 MED ORDER — CALCIUM CARBONATE ANTACID 500 MG PO CHEW
1000.0000 mg | CHEWABLE_TABLET | Freq: Three times a day (TID) | ORAL | Status: DC
  Administered 2022-08-07: 1000 mg via ORAL
  Filled 2022-08-07: qty 5

## 2022-08-07 MED ORDER — SODIUM CHLORIDE 0.9 % IV SOLN
4.0000 g | Freq: Two times a day (BID) | INTRAVENOUS | Status: DC
  Administered 2022-08-07: 4 g via INTRAVENOUS
  Filled 2022-08-07 (×2): qty 40

## 2022-08-07 NOTE — Progress Notes (Addendum)
[facility] Progress Note   Subjective: Seen room. Ca 6.2  this am. Not symptomatic. No numbness/tingling, no cp, palpitations. Completed HD yesterday with net UF 3L   Objective Vitals:   08/06/22 1628 08/06/22 1708 08/06/22 2151 08/07/22 0459  BP: 101/61 96/66 95/72  92/68  Pulse: 90 96 (!) 101 97  Resp: 16 17 16 18   Temp: 98 F (36.7 C) 98.8 F (37.1 C) 98.6 F (37 C) 98.4 F (36.9 C)  TempSrc: Oral Oral Oral Oral  SpO2: 94% 94% 95% (!) 88%  Weight: 90.3 kg     Height:  5\' 6"  (1.676 m)       Additional Objective Labs: Basic Metabolic Panel: Recent Labs  Lab 08/05/22 0841 08/06/22 0316 08/07/22 0731  NA 136 141 138  K 4.6 4.8 4.5  CL 94* 97* 97*  CO2 21* 25 25  GLUCOSE 86 88 81  BUN 109* 63* 49*  CREATININE 14.50* 9.21* 7.82*  CALCIUM 6.0* 5.5* 6.2*  PHOS 5.7* 4.5 3.0    CBC: Recent Labs  Lab 08/04/22 1533 08/04/22 2301 08/05/22 0841 08/06/22 1154 08/07/22 0731  WBC 11.5*  --  10.8* 9.9 7.7  NEUTROABS 8.0*  --   --   --   --   HGB 14.4   < > 13.0 12.8 13.6  HCT 49.1*   < > 42.2 43.1 45.4  MCV 97.0  --  94.6 95.4 95.4  PLT 272  --  253 259 232   < > = values in this interval not displayed.    Blood Culture    Component Value Date/Time   SDES BLOOD 01/19/2016 1020   SPECREQUEST BOTTLES DRAWN AEROBIC AND ANAEROBIC 10CC 01/19/2016 1020   CULT NO GROWTH 5 DAYS 01/19/2016 1020   REPTSTATUS 01/24/2016 FINAL 01/19/2016 1020     Physical Exam General: Well appearing, nad  Heart: RRR No m,r,g  Lungs: Clear bilaterally  Abdomen: soft non-tender Extremities: 1+ LE edema, bilaterally  Dialysis Access: L AVF +bruit   Medications:    calcitRIOL  2 mcg Oral Daily   calcium carbonate  800 mg of elemental calcium Oral TID BM   Chlorhexidine Gluconate Cloth  6 each Topical Q0600   heparin  5,000 Units Subcutaneous Q8H   midodrine  10 mg Oral Q T,Th,Sa-HD    Background: 46 y.o. 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 Presents  to ED due to SOB x 2 days. Pt is from out of town, and is moving to [place] and will be living with her sister. She could not get dialysis at the local HD unit in [place] so she came to the ED.  In ED K+ is 6.3, BUN 95, creat 13.7, CO2 18  Na 135.  WBC 11.5, Hb 14. CXR looks okay but read as "trace pulm edema". Ca is low at 5.3. Pt was sp parathyroidectomy this summer and has had issues w/ low Ca++'s since that surgery.They have been giving her IV Ca infusions during every HD lately w/ 4 gm Ca per HD session. She is not taking any CaCO3 at home,   - [facility] [place] - TTS EDW [phone]   Assessment/Plan: ESRD on HD TTS in [place], but states she is planning on staying here in [place]. Renal navigator working on CLIP to local HD unit.  Next HD Thurs -High Ca bath on order  Hyperkalemia - Resolved with HD.  Hypocalcemia - this is post PTX done this summer. Now getting 68mcg calcitriol, Tums  1000 mg CaCO3 tid between meals. S/p 4 gm Ca gluconate x 3.  She is minimally symptomatic from the low Ca it appears. Continue to trend Ca+ levels.  [place] for discharge once calcium is ~7 with PO meds.  BP/Volume - Blood pressures have been soft. On midodrine pre HD. Tolerating 2-3L UF. Not yet to EDW. UF as able.  Anemia esrd - Hb above goal,  no esa needs.  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 Will see how Ca+ levels do   Lynnda Child PA-C [facility] 08/07/2022,9:21 AM

## 2022-08-07 NOTE — Progress Notes (Signed)
Date and time results received: 08/07/22 8:59 (use smartphrase ".now" to insert current time)  Test: calcium Critical Value: 6.2  Name of Provider Notified: Vernell Leep, MD  Orders Received? Or Actions Taken?:  continue current plan of care

## 2022-08-07 NOTE — Progress Notes (Signed)
Contacted [facility] admissions this morning to request an update on pt's referral. Awaiting response and approval for out-pt HD placement.   Melven Sartorius Renal Navigator [phone]

## 2022-08-07 NOTE — Progress Notes (Signed)
PROGRESS NOTE   Sandy [name]  LKG:[id number]    DOB: [birth year]    DOA: 08/04/2022  PCP: Patient, No Pcp Per   I have briefly reviewed patients previous medical records in [facility].  Chief Complaint  Patient presents with   Shortness of Breath    Brief Narrative:  46 year old female, moving in from [place] to [place] [facility] [place] to live with her sister, ESRD on TTS HD since 2018, parathyroidectomy 04/10/2022 and has had issues with hypocalcemia since surgery, requiring IV calcium infusions during HD in addition to oral supplementation, HTN, Schatzki ring with dysphagia status post dilatation, Chronic low back pain on opiates with pain clinic, presented to the ED due to SOB x2 days in the context of inability to get dialysis at the local HD unit and GI so and hence came to the ED.  In the ED, potassium 6.3, BUN 95, creatinine 13.7, bicarbonate 18.  Admitted for hyperkalemia due to missed HD in the setting of ESRD and TTS HD.  Nephrology following.   Assessment & Plan:  Principal Problem:   Hyperkalemia Active Problems:   HTN (hypertension)   Volume overload   Hypocalcemia   History of parathyroidectomy (HCC)   Metabolic acidosis   ESRD on dialysis ([place])   ESRD on HD TTS: Nephrology consulted and their input much appreciated.  Patient was on HD in [place] but she plans on staying here in [place].  Renal navigator is working on CLIP to local HD unit.  Had HD on 11/14 with plan for next HD with high calcium bath on 11/16.  Hyperkalemia: Resolved with HD.  Post thyroidectomy hypocalcemia: History as noted above.  Reportedly not compliant with p.o. meds at home.  Was getting IV calcium infusions at HD in [place].  Nephrology managing this and plan to give calcium gluconate 4 g x 1 now (Ca 6.3) with follow-up calcium levels in 6 to 8 hours and increasing p.o calcium carbonate.  Per patient/family, goal calcium is 7-8.  Hypotension: Stable and asymptomatic.  Gets midodrine pre-HD  on dialysis days.  Body mass index is 32.13 kg/m.   DVT prophylaxis: heparin injection 5,000 Units Start: 08/05/22 0815     Code Status: Full Code:  Family Communication: Spouse and sister at bedside Disposition:  Status is: Inpatient Remains inpatient appropriate because: Getting IV calcium supplements and close monitoring of serum calcium levels.  Awaiting CLIP to outpatient HD center.     Consultants:   Nephrology  Procedures:   HD  Antimicrobials:      Subjective:  Seen this morning.  Family and patient's female RN at bedside.  Patient denied complaints.  Denies dizziness, lightheadedness, tingling, numbness, twitching or involuntary movements.  Objective:   Vitals:   08/06/22 1708 08/06/22 2151 08/07/22 0459 08/07/22 1025  BP: 96/66 95/72 92/68  97/67  Pulse: 96 (!) 101 97 99  Resp: 17 16 18 16   Temp: 98.8 F (37.1 C) 98.6 F (37 C) 98.4 F (36.9 C) 97.6 F (36.4 C)  TempSrc: Oral Oral Oral Oral  SpO2: 94% 95% (!) 88% (!) 75%  Weight:      Height: 5\' 6"  (1.676 m)       General exam: Young female, moderately built and obese sitting up comfortably at the edge of bed without distress. Respiratory system: Clear to auscultation. Respiratory effort normal. Cardiovascular system: S1 & S2 heard, RRR. No JVD, murmurs, rubs, gallops or clicks. No pedal edema.  Telemetry personally reviewed: Sinus rhythm. Gastrointestinal system: Abdomen is nondistended,  soft and nontender. No organomegaly or masses felt. Normal bowel sounds heard. Central nervous system: Alert and oriented. No focal neurological deficits. Extremities: Symmetric 5 x 5 power. Skin: No rashes, lesions or ulcers Psychiatry: Judgement and insight appear normal. Mood & affect appropriate.     Data Reviewed:   I have personally reviewed following labs and imaging studies   CBC: Recent Labs  Lab 08/04/22 1533 08/04/22 2301 08/05/22 0841 08/06/22 1154 08/07/22 0731  WBC 11.5*  --  10.8* 9.9 7.7   NEUTROABS 8.0*  --   --   --   --   HGB 14.4   < > 13.0 12.8 13.6  HCT 49.1*   < > 42.2 43.1 45.4  MCV 97.0  --  94.6 95.4 95.4  PLT 272  --  253 259 232   < > = values in this interval not displayed.    Basic Metabolic Panel: Recent Labs  Lab 08/04/22 1533 08/04/22 2205 08/04/22 2301 08/04/22 2315 08/05/22 0841 08/06/22 0316 08/07/22 0731 08/07/22 1421  NA 135  --  133* 136 136 141 138  --   K 6.3*  --  5.2* 5.1 4.6 4.8 4.5  --   CL 94*  --   --  93* 94* 97* 97*  --   CO2 18*  --   --  21* 21* 25 25  --   GLUCOSE 68*  --   --  112* 86 88 81  --   BUN 95*  --   --  101* 109* 63* 49*  --   CREATININE 13.78*  --   --  14.18* 14.50* 9.21* 7.82*  --   CALCIUM 5.3*  --   --  5.3* 6.0* 5.5* 6.2* 6.3*  MG  --  2.0  --   --   --   --   --   --   PHOS  --  5.3*  --   --  5.7* 4.5 3.0  --     Liver Function Tests: Recent Labs  Lab 08/04/22 2315 08/05/22 0841 08/06/22 0316 08/07/22 0731  AST 8*  --   --   --   ALT 8  --   --   --   ALKPHOS 192*  --   --   --   BILITOT 0.3  --   --   --   PROT 7.3  --   --   --   ALBUMIN 3.5 3.3* 3.0* 3.3*    CBG: Recent Labs  Lab 08/04/22 2341  GLUCAP 124*    Microbiology Studies:  No results found for this or any previous visit (from the past 240 hour(s)).  Radiology Studies:  No results found.  Scheduled Meds:    calcitRIOL  2 mcg Oral Daily   calcium carbonate  1,200 mg of elemental calcium Oral TID BM   Chlorhexidine Gluconate Cloth  6 each Topical Q0600   heparin  5,000 Units Subcutaneous Q8H   midodrine  10 mg Oral Q T,Th,Sa-HD    Continuous Infusions:    calcium gluconate       LOS: 2 days     Vernell Leep, MD,  FACP, FHM, SFHM, [facility], [place]     To contact the attending provider between 7A-7P or the covering provider during after hours 7P-7A, please log into the web site [email or web address] and access using universal [place] password for that  web site. If you do  not have the password, please call the hospital operator.  08/07/2022, 4:18 PM

## 2022-08-07 NOTE — TOC Initial Note (Addendum)
Transition of Care [facility]) - Initial/Assessment Note    Patient Details  Name: Sandy [name] MRN: [id number] Date of Birth: [birth year]  [facility] CM/SW Contact:    Tom-Johnson, Renea Ee, RN Phone Number: 08/07/2022, 2:21 PM  Clinical Narrative:                  CM spoke with patient at bedside about needs for post hospital transition. Admitted for Hyperkalemia. Recently moved from [place] to [place] and staying with her sister. Patient is on dialysis and clipping is underway to a facility in [place].  Does not have a PCP, states she usually goes to see her Nephrologist. Requests to schedule hosp f/u with Internal Medicine at discharge, info on AVS. Patient requests transportation to and from dialysis.  CM called and spoke with Jimmie Molly at [facility] [phone]) and Jimmie Molly states patient's Case Worker will give her a call to complete an assessment and set up for transportation. Patient notified and Jimmie Molly verified patient's phone number with patient via phone.  No PT/OT needs or recommendations noted at this time.  CM will continue to follow as patient progresses with care towards discharge.   Expected Discharge Plan: Home/Self Care Barriers to Discharge: Continued Medical Work up   Patient Goals and CMS Choice Patient states their goals for this hospitalization and ongoing recovery are:: To return home CMS Medicare.gov Compare Post Acute Care list provided to:: Patient Choice offered to / list presented to : Patient  Expected Discharge Plan and Services Expected Discharge Plan: Home/Self Care   Discharge Planning Services: CM Consult Post Acute Care Choice: NA Living arrangements for the past 2 months: Apartment                 DME Arranged: N/A DME Agency: NA       HH Arranged: NA [place] Agency: NA        Prior Living Arrangements/Services Living arrangements for the past 2 months: Apartment Lives with:: Siblings (Sister) Patient  language and need for interpreter reviewed:: Yes Do you feel safe going back to the place where you live?: Yes      Need for Family Participation in Patient Care: Yes (Comment) Care giver support system in place?: Yes (comment)   Criminal Activity/Legal Involvement Pertinent to Current Situation/Hospitalization: No - Comment as needed  Activities of Daily Living Home Assistive Devices/Equipment: None ADL Screening (condition at time of admission) Patient's cognitive ability adequate to safely complete daily activities?: Yes Is the patient deaf or have difficulty hearing?: No Does the patient have difficulty seeing, even when wearing glasses/contacts?: No Does the patient have difficulty concentrating, remembering, or making decisions?: No Patient able to express need for assistance with ADLs?: Yes Does the patient have difficulty dressing or bathing?: No Independently performs ADLs?: Yes (appropriate for developmental age) Does the patient have difficulty walking or climbing stairs?: No Weakness of Legs: None Weakness of Arms/Hands: None  Permission Sought/Granted Permission sought to share information with : Case Manager, Family Supports, Chartered certified accountant granted to share information with : Yes, Verbal Permission Granted              Emotional Assessment Appearance:: Appears stated age Attitude/Demeanor/Rapport: Engaged, Gracious Affect (typically observed): Accepting, Appropriate, Calm, Hopeful, Pleasant Orientation: : Oriented to Self, Oriented to Place, Oriented to  Time, Oriented to Situation Alcohol / Substance Use: Not Applicable Psych Involvement: No (comment)  Admission diagnosis:  Hyperkalemia [E87.5] SOB (shortness of breath) [R06.02] ESRD (end stage renal disease) (  [place]) [N18.6] Patient Active Problem List   Diagnosis Date Noted   Hyperkalemia 08/05/2022   Hypocalcemia 08/05/2022   History of parathyroidectomy ([place]) 82/99/3716    Metabolic acidosis 96/78/9381   ESRD on dialysis ([place]) 08/05/2022   Accelerated hypertension 01/06/2016   Atypical chest pain 01/06/2016   Volume overload 01/06/2016   Hypervolemia 01/05/2016   CKD (chronic kidney disease)    Plantar fasciitis of left foot 10/23/2015   Edema 10/14/2015   Anasarca associated with disorder of kidney 10/14/2015   CKD (chronic kidney disease), stage IV ([place]) 10/14/2015   HTN (hypertension) 10/14/2015   PCP:  Patient, No Pcp Per Pharmacy:   [facility] [place], [address] [place] [address] Phone: [phone] Fax: [phone]  [place], [address] [place] [address] Phone: [phone] Fax: [phone]     Social Determinants of Health (SDOH) Interventions    Readmission Risk Interventions     No data to display

## 2022-08-07 NOTE — Progress Notes (Signed)
Date and time results received: 08/07/22 1534 (use smartphrase ".now" to insert current time)  Test: calcium  Critical Value: 6.3  Name of Provider Notified: Dr. Algis Liming, and Dr. Jonnie Finner  Orders Received? Or Actions Taken?: Orders Received - See Orders for details

## 2022-08-08 DIAGNOSIS — N186 End stage renal disease: Secondary | ICD-10-CM

## 2022-08-08 DIAGNOSIS — E875 Hyperkalemia: Secondary | ICD-10-CM | POA: Diagnosis not present

## 2022-08-08 DIAGNOSIS — Z992 Dependence on renal dialysis: Secondary | ICD-10-CM

## 2022-08-08 LAB — RENAL FUNCTION PANEL
Albumin: 3.2 g/dL — ABNORMAL LOW (ref 3.5–5.0)
Anion gap: 19 — ABNORMAL HIGH (ref 5–15)
BUN: 80 mg/dL — ABNORMAL HIGH (ref 6–20)
CO2: 22 mmol/L (ref 22–32)
Calcium: 6.9 mg/dL — ABNORMAL LOW (ref 8.9–10.3)
Chloride: 94 mmol/L — ABNORMAL LOW (ref 98–111)
Creatinine, Ser: 10.86 mg/dL — ABNORMAL HIGH (ref 0.44–1.00)
GFR, Estimated: 4 mL/min — ABNORMAL LOW (ref >60)
Glucose, Bld: 118 mg/dL — ABNORMAL HIGH (ref 70–99)
Phosphorus: 2.9 mg/dL (ref 2.5–4.6)
Potassium: 4.9 mmol/L (ref 3.5–5.1)
Sodium: 135 mmol/L (ref 135–145)

## 2022-08-08 LAB — CBC
HCT: 43 % (ref 36.0–46.0)
Hemoglobin: 13 g/dL (ref 12.0–15.0)
MCH: 28.4 pg (ref 26.0–34.0)
MCHC: 30.2 g/dL (ref 30.0–36.0)
MCV: 94.1 fL (ref 80.0–100.0)
Platelets: 236 10*3/uL (ref 150–400)
RBC: 4.57 MIL/uL (ref 3.87–5.11)
RDW: 17 % — ABNORMAL HIGH (ref 11.5–15.5)
WBC: 9 10*3/uL (ref 4.0–10.5)
nRBC: 0 % (ref 0.0–0.2)

## 2022-08-08 MED ORDER — CALCIUM CARBONATE ANTACID 500 MG PO CHEW
1600.0000 mg | CHEWABLE_TABLET | Freq: Three times a day (TID) | ORAL | Status: DC
  Administered 2022-08-08 (×2): 1600 mg via ORAL
  Filled 2022-08-08 (×2): qty 8

## 2022-08-08 MED ORDER — SODIUM CHLORIDE 0.9 % IV SOLN
4.0000 g | Freq: Once | INTRAVENOUS | Status: AC
  Administered 2022-08-08: 4 g via INTRAVENOUS
  Filled 2022-08-08: qty 40

## 2022-08-08 MED ORDER — LIDOCAINE-PRILOCAINE 2.5-2.5 % EX CREA: 1.0000 | TOPICAL_CREAM | CUTANEOUS | Status: DC | PRN

## 2022-08-08 MED ORDER — LIDOCAINE HCL (PF) 1 % IJ SOLN: 5.0000 mL | INTRAMUSCULAR | Status: DC | PRN

## 2022-08-08 MED ORDER — HEPARIN SODIUM (PORCINE) 1000 UNIT/ML DIALYSIS: 20.0000 [IU]/kg | INTRAMUSCULAR | Status: DC | PRN

## 2022-08-08 MED ORDER — CALCIUM CARBONATE ANTACID 500 MG PO CHEW: 1600.0000 mg | CHEWABLE_TABLET | Freq: Three times a day (TID) | ORAL | 1 refills | Status: AC

## 2022-08-08 MED ORDER — HEPARIN SODIUM (PORCINE) 1000 UNIT/ML IJ SOLN
INTRAMUSCULAR | Status: AC
  Filled 2022-08-08: qty 2

## 2022-08-08 MED ORDER — PENTAFLUOROPROP-TETRAFLUOROETH EX AERO: 1.0000 | INHALATION_SPRAY | CUTANEOUS | Status: DC | PRN

## 2022-08-08 MED ORDER — VELTASSA 8.4 G PO PACK: 1.0000 | PACK | Freq: Every day | ORAL | Status: AC | PRN

## 2022-08-08 NOTE — Progress Notes (Signed)
[facility] Progress Note   Subjective: Seen room. Ca 6.9 - to get another 4g calcium gluconate today. Increasing PO calcium carbonate 1600 mg TID - discussed with her. Trying to get Ca >7. Waiting for update on outpatient dialysis chair. Not symptomatic. No numbness/tingling.    Objective Vitals:   08/07/22 1813 08/07/22 2128 08/08/22 0448 08/08/22 0934  BP: 108/77 101/73 100/62 (!) 92/54  Pulse: 100 (!) 104 100 (!) 104  Resp:  18 18 18   Temp: 97.8 F (36.6 C) 98.4 F (36.9 C) 98.2 F (36.8 C) 98.2 F (36.8 C)  TempSrc: Oral Oral Oral Oral  SpO2: 90% 98% 97% 97%  Weight:      Height:         Additional Objective Labs: Basic Metabolic Panel: Recent Labs  Lab 08/06/22 0316 08/07/22 0731 08/07/22 1421 08/08/22 0611  NA 141 138  --  135  K 4.8 4.5  --  4.9  CL 97* 97*  --  94*  CO2 25 25  --  22  GLUCOSE 88 81  --  118*  BUN 63* 49*  --  80*  CREATININE 9.21* 7.82*  --  10.86*  CALCIUM 5.5* 6.2* 6.3* 6.9*  PHOS 4.5 3.0  --  2.9    CBC: Recent Labs  Lab 08/04/22 1533 08/04/22 2301 08/05/22 0841 08/06/22 1154 08/07/22 0731 08/08/22 0611  WBC 11.5*  --  10.8* 9.9 7.7 9.0  NEUTROABS 8.0*  --   --   --   --   --   HGB 14.4   < > 13.0 12.8 13.6 13.0  HCT 49.1*   < > 42.2 43.1 45.4 43.0  MCV 97.0  --  94.6 95.4 95.4 94.1  PLT 272  --  253 259 232 236   < > = values in this interval not displayed.    Blood Culture    Component Value Date/Time   SDES BLOOD 01/19/2016 1020   SPECREQUEST BOTTLES DRAWN AEROBIC AND ANAEROBIC 10CC 01/19/2016 1020   CULT NO GROWTH 5 DAYS 01/19/2016 1020   REPTSTATUS 01/24/2016 FINAL 01/19/2016 1020     Physical Exam General: Well appearing, nad  Heart: RRR No m,r,g  Lungs: Clear bilaterally  Abdomen: soft non-tender Extremities: 1+ LE edema, bilaterally  Dialysis Access: L AVF +bruit   Medications:  calcium gluconate 4 g (08/07/22 1734)     calcitRIOL  2 mcg Oral Daily   calcium carbonate  1,600 mg of  elemental calcium Oral TID BM   Chlorhexidine Gluconate Cloth  6 each Topical Q0600   heparin  5,000 Units Subcutaneous Q8H   midodrine  10 mg Oral Q T,Th,Sa-HD    Background: 46 y.o. 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 Presents to ED due to SOB x 2 days. Pt is from out of town, and is moving to [place] and will be living with her sister. She could not get dialysis at the local HD unit in [place] so she came to the ED.  In ED K+ is 6.3, BUN 95, creat 13.7, CO2 18  Na 135.  WBC 11.5, Hb 14. CXR looks okay but read as "trace pulm edema". Ca is low at 5.3. Pt was sp parathyroidectomy this summer and has had issues w/ low Ca++'s since that surgery.They have been giving her IV Ca infusions during every HD lately w/ 4 gm Ca per HD session. She is not taking any CaCO3 at home,   - [facility] -  TTS EDW 86kg   Assessment/Plan: ESRD on HD TTS in [place], but states she is planning on staying here in [place]. Renal navigator working on CLIP to local HD unit.  Next HD Thurs -High Ca bath on order  Hyperkalemia - Resolved with HD.  Hypocalcemia - this is post PTX done this summer. Now getting 105mcg calcitriol, Tums 1600 mg CaCO3 tid between meals. IV calcium gluconate prn. She is minimally symptomatic from the low Ca it appears. Continue to trend Ca+ levels.  [place] for discharge once calcium is >7 with PO meds.  BP/Volume - Blood pressures have been soft. On midodrine pre HD. Tolerating 2-3L UF. Not yet to EDW. UF as able.  Anemia esrd - Hb above goal,  no esa needs.  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 Will see how Ca+ levels do   Lynnda Child PA-C [facility] 08/08/2022,10:34 AM

## 2022-08-08 NOTE — Progress Notes (Signed)
Received patient in bed to unit.  Alert and oriented.  Informed consent signed and in chart.   Treatment initiated: 1521 Treatment completed: 2021  Patient tolerated well.  Transported back to the room  Alert, without acute distress.  Hand-off given to patient's nurse.   Access used: AVF Access issues: Constant venous pressure alarm.  Total UF removed: 2.4L Medication(s) given: Calcium Gluconate Post HD VS: 95/52,102,94%,16,97.9 Post HD weight: 95.0kg   [facility]

## 2022-08-08 NOTE — Progress Notes (Signed)
Pt's blood pressure is in the 70, and 16X systolic. Spoke with provider and made aware of the situation. Given order for no uf and maintain systolic greater than 95 and give saline bolus. Pressure after saline bolus at this time is 92/49. Will continue to monitor and tx as indicated.

## 2022-08-08 NOTE — Discharge Instructions (Signed)

## 2022-08-08 NOTE — Progress Notes (Addendum)
Contacted [facility] admissions regarding status of pt's referral. Advised that pt's home clinic needs to submit a form to [facility] admissions so pt's new placement can be finalized. Contacted [facility] [phone]) and spoke to [place]. Clinic advised that pt's 2728 form is needed by [facility] admissions. Pamala Hurry provided the phone number to [facility] admissions coordinator and admissions fax # so form can be faxed. Pt cannot be approved for [facility] until form is received by [facility] admissions staff. Spoke to pt via phone to make her aware of reason for delay.   Melven Sartorius Renal Navigator [phone]  Addendum at 5:03 pm: Pt has been accepted at [facility] on MWF 6:30 am chair time. Clinic is requesting that pt come to clinic tomorrow to complete paperwork between the hrs of 9:00-3:00. Pt can start at clinic on Sunday (due to Thanksgiving Holiday). If pt completes paperwork at clinic tomorrow then pt will need to arrive at 6:10 am on Sunday. If pt unable to complete paperwork at clinic tomorrow, then pt will need to arrive at 5:45 am on Sunday. Met with pt at bedside while receiving HD. Pt aware of arrangements and agreeable to plan. Schedule letter provided to pt and arrangements added to AVS as well. Update provided to treatment team via secure chat. Renal PA aware of pt's d/c and to send orders to clinic. Advised clinic pt will d/c today and should come tomorrow to complete paperwork. Pt's schedule next week due to the Thanksgiving Holiday will be Sunday,Tuesday, Friday.

## 2022-08-08 NOTE — Discharge Summary (Signed)
Physician Discharge Summary  Sandy [name] HYW:[id number] DOB: 01-18-1976  PCP: Patient, No Pcp Per  Admitted from: Home Discharged to: Home  Admit date: 08/04/2022 Discharge date: 08/08/2022  Recommendations for Outpatient Follow-up:    [facility]. Go on 08/09/2022.   Why: Schedule is Monday/Wednesday/Friday with 6:30 am chair time.  Please complete paperwork at clinic on Friday, Nov 17 between 9:00-3:00.  First treatment will be Sunday, Nov 19 due to Thanksgiving Holiday. Arrive at 6:10 am. If unable to complete paperwork on Friday, please arrive at 5:45 am on Sunday. Contact information: [address] [place] [address] [phone]                All labs outpatient to be drawn across HD.  As per communication with the nephrology team, patient will have repeat labs drawn at the clinic tomorrow.  These will include renal panel and as needed CBCs.  Home Health: None    Equipment/Devices: None    Discharge Condition: Improved and stable.   Code Status: Full Code Diet recommendation:  Discharge Diet Orders (From admission, onward)     Start     Ordered   08/08/22 0000  Diet - low sodium heart healthy        11 /16/23 1737             Discharge Diagnoses:  Principal Problem:   Hyperkalemia Active Problems:   HTN (hypertension)   Volume overload   Hypocalcemia   History of parathyroidectomy (HCC)   Metabolic acidosis   ESRD on dialysis [facility])   Brief Summary: 46 year old female, moving in from [place] to [place] to live with her sister, ESRD on TTS HD since 2018, parathyroidectomy 04/10/2022 and has had issues with hypocalcemia since surgery, requiring IV calcium infusions during HD in addition to oral supplementation, HTN, Schatzki ring with dysphagia status post dilatation, Chronic low back pain on opiates with pain clinic, presented to the ED due to SOB x2 days in the context of inability to get  dialysis at the local HD unit and GI so and hence came to the ED.  In the ED, potassium 6.3, BUN 95, creatinine 13.7, bicarbonate 18.  Admitted for hyperkalemia due to missed HD in the setting of ESRD and TTS HD.  Nephrology consulted.     Assessment & Plan:    ESRD on HD TTS: Nephrology consulted and their input much appreciated.  Patient was on HD in [place] but she plans on staying here in [place].  Nephrology assisted with hemodialysis.  She underwent hemodialysis on 11/14 and 11/16.  Renal navigator worked on [employer] to local HD unit, and patient was approved for same this evening.  Details as noted above in the follow-up section.  As discussed with nephrology team, cleared for discharge home after hemodialysis and getting the ordered dose of IV calcium gluconate.  Nephrology will have labs repeated at the clinic tomorrow.   Hyperkalemia: Resolved with HD.  Patient on Veltassa as needed for hyperkalemia at home.  Unsure how she knows when to take it.  Defer to outpatient nephrology follow-up.   Post thyroidectomy hypocalcemia: History as noted above.  Reportedly not compliant with p.o. meds at home.  Was getting IV calcium infusions at HD in [place].  Per patient/family, goal calcium is 7-8.  Nephrology has been following this, replacing with IV calcium gluconate and uptitrating oral calcium supplements with goal of trying to get calcium to >7 prior to discharge.  Patient asymptomatic.  Serum calcium 6.9 today with plans for high calcium bath, 4 g of IV calcium gluconate x1 today and increasing p.o. calcium carbonate to 1600 mg 3 times daily p.o.. Continue calcitriol 2 mcg daily.  As discussed with nephrology team this afternoon, okay to discharge patient home after she gets the dose of IV calcium gluconate 4 g, on above dose of elemental calcium but no calcitriol which she will be receiving across HD.   Hypotension: Gets midodrine pre-HD on dialysis days.  Noted hypotension across HD today,  treated with IV saline bolus, avoiding ultrafiltration and BP improved.   Body mass index is 34.44 kg/m./Obesity    Consultants:   Nephrology   Procedures:   HD  Discharge Instructions  Discharge Instructions     Call MD for:  difficulty breathing, headache or visual disturbances   Complete by: As directed    Call MD for:  extreme fatigue   Complete by: As directed    Call MD for:  persistant dizziness or light-headedness   Complete by: As directed    Call MD for:  persistant nausea and vomiting   Complete by: As directed    Call MD for:  redness, tenderness, or signs of infection (pain, swelling, redness, odor or green/yellow discharge around incision site)   Complete by: As directed    Call MD for:  severe uncontrolled pain   Complete by: As directed    Call MD for:  temperature >100.4   Complete by: As directed    Diet - low sodium heart healthy   Complete by: As directed    Increase activity slowly   Complete by: As directed         Medication List     TAKE these medications    calcium carbonate 500 MG chewable tablet Commonly known as: TUMS - dosed in mg elemental calcium Chew 8 tablets (1,600 mg of elemental calcium total) by mouth 3 (three) times daily between meals. What changed:  medication strength how much to take when to take this   cyclobenzaprine 10 MG tablet Commonly known as: FLEXERIL Take 10 mg by mouth at bedtime.   midodrine 10 MG tablet Commonly known as: PROAMATINE Take 10 mg by mouth 3 (three) times a week. Take 1 tablet by mouth before dialysis on Tue, Thurs, Sat   oxyCODONE-acetaminophen 10-325 MG tablet Commonly known as: PERCOCET Take 1 tablet by mouth 2 (two) times daily.   Veltassa 8.4 g packet Generic drug: patiromer Take 1 packet by mouth daily as needed (for hyperkalemia). What changed: reasons to take this       Allergies  Allergen Reactions   Lisinopril Cough      Procedures/Studies: DG Chest 2  View  Result Date: 08/04/2022 CLINICAL DATA:  Shortness of breath, dialysis patient EXAM: CHEST - 2 VIEW COMPARISON:  Chest x-ray August 03, 2022 FINDINGS: The cardiomediastinal silhouette is unchanged in contour. Minimal basilar predominant reticular pulmonary opacities. No pleural effusion or pneumothorax. The visualized upper abdomen is unremarkable. No acute osseous abnormality. Partially visualized vascular stent in the right upper extremity. IMPRESSION: Trace pulmonary edema. Electronically Signed   By: Beryle Flock M.D.   On: 08/04/2022 15:53   DG Chest 1 View  Result Date: 08/03/2022 CLINICAL DATA:  Shortness of breath, weakness EXAM: CHEST  1 VIEW COMPARISON:  Chest x-ray January 19, 2016 FINDINGS: The cardiomediastinal silhouette is unchanged in contour. No focal pulmonary opacity. No pleural effusion or pneumothorax. The visualized upper abdomen  is unremarkable. No acute osseous abnormality. Vascular stent projecting over the right axilla. IMPRESSION: No acute cardiopulmonary abnormality. Electronically Signed   By: Beryle Flock M.D.   On: 08/03/2022 16:40      Subjective: Seen this morning prior to HD.  Patient's female RN at bedside.  Patient concerned that right cubital fossa IV access site was infected (examined and not clinically infected).  Also patient asking what would happen if she left AMA because she is tired of prolonged hospital stay and states that she can go to the [facility] dialysis center in [place].  Advised her against leaving AMA and risks associated with that including decline and even death.  Informed her that it is possible that even if she went to the dialysis center, she may not be accepted for dialysis   Discharge Exam:  Vitals:   08/08/22 1630 08/08/22 1649 08/08/22 1700 08/08/22 1730  BP: 94/63 (!) 100/54 93/69 100/60  Pulse: 80 95 89 94  Resp:      Temp:      TempSrc:      SpO2: 95% 91% 98% 96%  Weight:      Height:        General exam:  Young female, moderately built and obese sitting up comfortably at the edge of bed without distress. Respiratory system: Clear to auscultation.  No increased work of breathing. Cardiovascular system: S1 & S2 heard, RRR. No JVD, murmurs, rubs, gallops or clicks. No pedal edema.  Telemetry personally reviewed: SR in the 90s with occasional ST in the 110s. Gastrointestinal system: Abdomen is nondistended, soft and nontender. No organomegaly or masses felt. Normal bowel sounds heard. Central nervous system: Alert and oriented. No focal neurological deficits. Extremities: Symmetric 5 x 5 power.  Right cubital fossa with IV line puncture site without any findings to suggest local soft tissue infection.  Monitor. Skin: No rashes, lesions or ulcers Psychiatry: Judgement and insight appear normal. Mood & affect appropriate.     The results of significant diagnostics from this hospitalization (including imaging, microbiology, ancillary and laboratory) are listed below for reference.     Microbiology: No results found for this or any previous visit (from the past 240 hour(s)).   Labs: CBC: Recent Labs  Lab 08/04/22 1533 08/04/22 2301 08/05/22 0841 08/06/22 1154 08/07/22 0731 08/08/22 0611  WBC 11.5*  --  10.8* 9.9 7.7 9.0  NEUTROABS 8.0*  --   --   --   --   --   HGB 14.4 16.0* 13.0 12.8 13.6 13.0  HCT 49.1* 47.0* 42.2 43.1 45.4 43.0  MCV 97.0  --  94.6 95.4 95.4 94.1  PLT 272  --  253 259 232 825    Basic Metabolic Panel: Recent Labs  Lab 08/04/22 2205 08/04/22 2301 08/04/22 2315 08/05/22 0841 08/06/22 0316 08/07/22 0731 08/07/22 1421 08/08/22 0611  NA  --    < > 136 136 141 138  --  135  K  --    < > 5.1 4.6 4.8 4.5  --  4.9  CL  --   --  93* 94* 97* 97*  --  94*  CO2  --   --  21* 21* 25 25  --  22  GLUCOSE  --   --  112* 86 88 81  --  118*  BUN  --   --  101* 109* 63* 49*  --  80*  CREATININE  --   --  14.18* 14.50* 9.21* 7.82*  --  10.86*  CALCIUM  --   --  5.3* 6.0*  5.5* 6.2* 6.3* 6.9*  MG 2.0  --   --   --   --   --   --   --   PHOS 5.3*  --   --  5.7* 4.5 3.0  --  2.9   < > = values in this interval not displayed.    Liver Function Tests: Recent Labs  Lab 08/04/22 2315 08/05/22 0841 08/06/22 0316 08/07/22 0731 08/08/22 0611  AST 8*  --   --   --   --   ALT 8  --   --   --   --   ALKPHOS 192*  --   --   --   --   BILITOT 0.3  --   --   --   --   PROT 7.3  --   --   --   --   ALBUMIN 3.5 3.3* 3.0* 3.3* 3.2*    CBG: Recent Labs  Lab 08/04/22 2341  GLUCAP 124*     Time coordinating discharge: 35 minutes  SIGNED:  Vernell Leep, MD,  FACP, FHM, [facility], [facility], [employer]     To contact the attending provider between 7A-7P or the covering provider during after hours 7P-7A, please log into the web site [email or web address] and access using universal [place] password for that web site. If you do not have the password, please call the hospital operator.

## 2022-08-08 NOTE — Progress Notes (Addendum)
PROGRESS NOTE   Sandy [name]  RAQ:[id number]    DOB: [birth year]    DOA: 08/04/2022  PCP: Patient, No Pcp Per   I have briefly reviewed patients previous medical records in [facility].  Chief Complaint  Patient presents with   Shortness of Breath    Brief Narrative:  46 year old female, moving in from [place] to [place] to live with her sister, ESRD on TTS HD since 2018, parathyroidectomy 04/10/2022 and has had issues with hypocalcemia since surgery, requiring IV calcium infusions during HD in addition to oral supplementation, HTN, Schatzki ring with dysphagia status post dilatation, Chronic low back pain on opiates with pain clinic, presented to the ED due to SOB x2 days in the context of inability to get dialysis at the local HD unit and GI so and hence came to the ED.  In the ED, potassium 6.3, BUN 95, creatinine 13.7, bicarbonate 18.  Admitted for hyperkalemia due to missed HD in the setting of ESRD and TTS HD.  Nephrology following.   Assessment & Plan:  Principal Problem:   Hyperkalemia Active Problems:   HTN (hypertension)   Volume overload   Hypocalcemia   History of parathyroidectomy (HCC)   Metabolic acidosis   ESRD on dialysis ([facility])   ESRD on HD TTS: Nephrology consulted and their input much appreciated.  Patient was on HD in [place] but she plans on staying here in [place].  Renal navigator is working on CLIP to local HD unit, still does not have approval for local outpatient HD (discussed with renal navigator).  Had HD on 11/14 with plan for next HD with high calcium bath on 11/16.  Hyperkalemia: Resolved with HD.  Post thyroidectomy hypocalcemia: History as noted above.  Reportedly not compliant with p.o. meds at home.  Was getting IV calcium infusions at HD in [place].  Per patient/family, goal calcium is 7-8.  Nephrology has been following this, replacing with IV calcium gluconate and uptitrating oral calcium supplements with goal of trying to get  calcium to >7 prior to discharge.  Patient asymptomatic.  Serum calcium 6.9 today with plans for high calcium bath, 4 g of IV calcium gluconate x1 today and increasing p.o. calcium carbonate to 1600 mg 3 times daily p.o.. Continue calcitriol 2 mcg daily.  Hypotension: Gets midodrine pre-HD on dialysis days.  Noted hypotension across HD today, treated with IV saline bolus, avoiding ultrafiltration and BP improved.  Body mass index is 34.44 kg/m.   DVT prophylaxis: heparin injection 5,000 Units Start: 08/05/22 0815     Code Status: Full Code:  Family Communication: None at bedside. Disposition:  Status is: Inpatient Remains inpatient appropriate because: Getting IV calcium supplements and close monitoring of serum calcium levels.  Awaiting CLIP to outpatient HD center.     Consultants:   Nephrology  Procedures:   HD  Antimicrobials:      Subjective:  Seen this morning prior to HD.  Patient's female RN at bedside.  Patient concerned that right cubital fossa IV access site was infected (examined and not clinically infected).  Also patient asking what would happen if she left AMA because she is tired of prolonged hospital stay and states that she can go to the [facility] dialysis center in [place].  Advised her against leaving AMA and risks associated with that including decline and even death.  Informed her that it is possible that even if she went to the dialysis center, she may not be accepted for dialysis.  Objective:  Vitals:   08/08/22 1500 08/08/22 1517 08/08/22 1537 08/08/22 1600  BP:  (!) 95/48 (!) 77/52 (!) 92/49  Pulse:  96 (!) 160 89  Resp: (!) 25     Temp:  98 F (36.7 C)    TempSrc:  Oral    SpO2:  97% (!) 59% 93%  Weight:  96.8 kg    Height:        General exam: Young female, moderately built and obese sitting up comfortably at the edge of bed without distress. Respiratory system: Clear to auscultation.  No increased work of breathing. Cardiovascular system:  S1 & S2 heard, RRR. No JVD, murmurs, rubs, gallops or clicks. No pedal edema.  Telemetry personally reviewed: SR in the 90s with occasional ST in the 110s. Gastrointestinal system: Abdomen is nondistended, soft and nontender. No organomegaly or masses felt. Normal bowel sounds heard. Central nervous system: Alert and oriented. No focal neurological deficits. Extremities: Symmetric 5 x 5 power.  Right cubital fossa with IV line puncture site without any findings to suggest local soft tissue infection.  Monitor. Skin: No rashes, lesions or ulcers Psychiatry: Judgement and insight appear normal. Mood & affect appropriate.     Data Reviewed:   I have personally reviewed following labs and imaging studies   CBC: Recent Labs  Lab 08/04/22 1533 08/04/22 2301 08/06/22 1154 08/07/22 0731 08/08/22 0611  WBC 11.5*   < > 9.9 7.7 9.0  NEUTROABS 8.0*  --   --   --   --   HGB 14.4   < > 12.8 13.6 13.0  HCT 49.1*   < > 43.1 45.4 43.0  MCV 97.0   < > 95.4 95.4 94.1  PLT 272   < > 259 232 236   < > = values in this interval not displayed.    Basic Metabolic Panel: Recent Labs  Lab 08/04/22 2205 08/04/22 2301 08/04/22 2315 08/05/22 0841 08/06/22 0316 08/07/22 0731 08/07/22 1421 08/08/22 0611  NA  --    < > 136 136 141 138  --  135  K  --    < > 5.1 4.6 4.8 4.5  --  4.9  CL  --   --  93* 94* 97* 97*  --  94*  CO2  --   --  21* 21* 25 25  --  22  GLUCOSE  --   --  112* 86 88 81  --  118*  BUN  --   --  101* 109* 63* 49*  --  80*  CREATININE  --   --  14.18* 14.50* 9.21* 7.82*  --  10.86*  CALCIUM  --   --  5.3* 6.0* 5.5* 6.2* 6.3* 6.9*  MG 2.0  --   --   --   --   --   --   --   PHOS 5.3*  --   --  5.7* 4.5 3.0  --  2.9   < > = values in this interval not displayed.    Liver Function Tests: Recent Labs  Lab 08/04/22 2315 08/05/22 0841 08/06/22 0316 08/07/22 0731 08/08/22 0611  AST 8*  --   --   --   --   ALT 8  --   --   --   --   ALKPHOS 192*  --   --   --   --   BILITOT  0.3  --   --   --   --   PROT  7.3  --   --   --   --   ALBUMIN 3.5 3.3* 3.0* 3.3* 3.2*    CBG: Recent Labs  Lab 08/04/22 2341  GLUCAP 124*    Microbiology Studies:  No results found for this or any previous visit (from the past 240 hour(s)).  Radiology Studies:  No results found.  Scheduled Meds:    calcitRIOL  2 mcg Oral Daily   calcium carbonate  1,600 mg of elemental calcium Oral TID BM   Chlorhexidine Gluconate Cloth  6 each Topical Q0600   heparin  5,000 Units Subcutaneous Q8H   heparin sodium (porcine)       midodrine  10 mg Oral Q T,Th,Sa-HD    Continuous Infusions:    calcium gluconate       LOS: 3 days     Vernell Leep, MD,  FACP, FHM, SFHM, [facility], [place]     To contact the attending provider between 7A-7P or the covering provider during after hours 7P-7A, please log into the web site [email or web address] and access using universal [place] password for that web site. If you do not have the password, please call the hospital operator.  08/08/2022, 4:22 PM

## 2022-08-08 NOTE — Care Management Important Message (Signed)
Important Message  Patient Details  Name: Sandy [name] MRN: [id number] Date of Birth: 12/25/1975   Medicare Important Message Given:  Yes     Yousuf Ager 08/08/2022, 2:30 PM

## 2022-08-09 ENCOUNTER — Telehealth: Payer: Self-pay | Admitting: Nephrology

## 2022-08-09 NOTE — Telephone Encounter (Signed)
Transition of care contact from inpatient facility  Date of Discharge: 08/08/22 Date of Contact: 08/09/22 Method of contact: Phone -attempt  Attempted to contact patient to discuss transition of care from inpatient admission. Patient did not answer the phone. Will follow up at outpatient dialysis.

## 2022-08-21 ENCOUNTER — Ambulatory Visit: Payer: Medicaid Other | Admitting: Student

## 2022-08-29 ENCOUNTER — Ambulatory Visit: Payer: Medicare Other

## 2022-08-29 ENCOUNTER — Ambulatory Visit (INDEPENDENT_AMBULATORY_CARE_PROVIDER_SITE_OTHER): Payer: Medicare Other | Admitting: Student

## 2022-08-29 ENCOUNTER — Encounter: Payer: Self-pay | Admitting: Student

## 2022-08-29 VITALS — BP 104/76 | HR 92 | Ht 66.0 in | Wt 197.9 lb

## 2022-08-29 VITALS — BP 104/67 | HR 92 | Ht 66.0 in | Wt 197.9 lb

## 2022-08-29 DIAGNOSIS — Z13220 Encounter for screening for lipoid disorders: Secondary | ICD-10-CM | POA: Diagnosis not present

## 2022-08-29 DIAGNOSIS — I959 Hypotension, unspecified: Secondary | ICD-10-CM

## 2022-08-29 DIAGNOSIS — Z23 Encounter for immunization: Secondary | ICD-10-CM

## 2022-08-29 DIAGNOSIS — G894 Chronic pain syndrome: Secondary | ICD-10-CM | POA: Diagnosis not present

## 2022-08-29 DIAGNOSIS — Z0000 Encounter for general adult medical examination without abnormal findings: Secondary | ICD-10-CM

## 2022-08-29 DIAGNOSIS — Z599 Problem related to housing and economic circumstances, unspecified: Secondary | ICD-10-CM | POA: Diagnosis not present

## 2022-08-29 DIAGNOSIS — I953 Hypotension of hemodialysis: Secondary | ICD-10-CM

## 2022-08-29 DIAGNOSIS — F1190 Opioid use, unspecified, uncomplicated: Secondary | ICD-10-CM | POA: Diagnosis present

## 2022-08-29 DIAGNOSIS — Z992 Dependence on renal dialysis: Secondary | ICD-10-CM

## 2022-08-29 DIAGNOSIS — E875 Hyperkalemia: Secondary | ICD-10-CM | POA: Diagnosis not present

## 2022-08-29 DIAGNOSIS — N186 End stage renal disease: Secondary | ICD-10-CM

## 2022-08-29 DIAGNOSIS — Z131 Encounter for screening for diabetes mellitus: Secondary | ICD-10-CM

## 2022-08-29 DIAGNOSIS — Z135 Encounter for screening for eye and ear disorders: Secondary | ICD-10-CM | POA: Diagnosis not present

## 2022-08-29 HISTORY — DX: Hypotension, unspecified: I95.9

## 2022-08-29 MED ORDER — CYCLOBENZAPRINE HCL 10 MG PO TABS: 10.0000 mg | ORAL_TABLET | Freq: Two times a day (BID) | ORAL | 11 refills | Status: DC

## 2022-08-29 MED ORDER — OXYCODONE-ACETAMINOPHEN 10-325 MG PO TABS: 1.0000 | ORAL_TABLET | Freq: Three times a day (TID) | ORAL | 0 refills | Status: DC | PRN

## 2022-08-29 NOTE — Assessment & Plan Note (Signed)
Potassium to 6.3 a recent hospitalization after not able to get dialysis. Patient received 1 dose of calcium gluconate prior to discharge.  Her potassium normalized to 4.9.

## 2022-08-29 NOTE — Assessment & Plan Note (Signed)
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 Patient is currently requiring IV calcium infusions during HD and p.o. supplementation.  After hospitalization, patient is to continue p.o. calcium supplementation per Nephrology and continue IV supplementation at hemodialysis. No paresthesias, spasms, stiffness today.  Usually patient feels eye twitching and muscle cramps Last Ca at HD 6.9.  Patient received hecterol and ca bath yesterday -49mcg calcitriol -Tums 1600 mg CaCO3 tid between meals -Calcium replacement prn at hD.

## 2022-08-29 NOTE — Progress Notes (Signed)
Subjective:  CC: HFU  HPI:  [name] is a 46 y.o. female with a past medical history stated below and presents today to establish care after hospital discharge for shortness of breath in the setting of inability to access his dialysis since her move from [place], [place]. Please see problem based assessment and plan for additional details.  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 360 tablet 1   midodrine (PROAMATINE) 10 MG tablet Take 10 mg by mouth 3 (three) times a week. Take 1 tablet by mouth before dialysis on Tue, Thurs, Sat     VELTASSA 8.4 g packet Take 1 packet by mouth daily as needed (for hyperkalemia).     No current facility-administered medications on file prior to visit.    Family History  Problem Relation Age of Onset   Diabetes Father     Social History   Socioeconomic History   Marital status: Single    Spouse name: Not on file   Number of children: Not on file   Years of education: Not on file   Highest education level: Not on file  Occupational History   Not on file  Tobacco Use   Smoking status: Former    Packs/day: 0.25    Types: Cigarettes    Quit date: 12/23/2015    Years since quitting: 6.6   Smokeless tobacco: Never  Substance and Sexual Activity   Alcohol use: No    Alcohol/week: 0.0 standard drinks of alcohol   Drug use: No   Sexual activity: Not on file  Other Topics Concern   Not on file  Social History Narrative   Not on file   Social Determinants of Health   Financial Resource Strain: Not on file  Food  Insecurity: No Food Insecurity (08/06/2022)   Hunger Vital Sign    Worried About Running Out of Food in the Last Year: Never true    Ran Out of Food in the Last Year: Never true  Transportation Needs: No Transportation Needs (08/06/2022)   PRAPARE - Hydrologist (Medical): No    Lack of Transportation (Non-Medical): No  Physical Activity: Not on file  Stress: Not on file  Social Connections: Not on file  Intimate Partner Violence: Not At Risk (08/06/2022)   Humiliation, Afraid, Rape, and Kick questionnaire    Fear of Current or Ex-Partner: No    Emotionally Abused: No    Physically Abused: No    Sexually Abused: No    Review of Systems: ROS negative except for what is noted on the assessment and plan.  Objective:   Vitals:   08/29/22 0854  BP: 104/67  Pulse: 92  SpO2: 100%  Weight: 197 lb 14.4 oz (89.8 kg)  Height: 5\' 6"  (1.676 m)    Physical Exam: Constitutional: well-appearing woman sitting in chair, in no acute distress HENT: normocephalic atraumatic, mucous membranes moist Eyes: conjunctiva non-erythematous Neck: supple Cardiovascular: regular rate and rhythm, no m/r/g. No JVD LUE AVF with good thrill and  bruit present Pulmonary/Chest: normal work of breathing on room air, lungs clear to auscultation bilaterally. No crackles Abdominal: soft, non-tender, non-distended MSK: normal bulk and tone. No lower extremity edema Neurological: alert & oriented x 3, 5/5 strength in bilateral upper and lower extremities, normal gait Skin: warm and dry Psych: Pleasant mood and affect     Assessment & Plan:   ESRD on dialysis ([place]) ESRD of unknown etiology. Patient mentions she's never been diagnosed with HTN or DM. She underwent kidney biopsy in [place]. She does not recall the results but that her kidney function declined rapidly. HD schedule TTS is 2018.  However, patient removed from [place], [place] to [place] and was unable to get dialysis at the  local HD unit.  Patient presented to the ED on 11/12.  K of 6.3, BUN of 95, bicarb of 18.  Nephrology saw patient and patient underwent hemodialysis on 11/14 and 11/16.  Patient was discharged after the second hemodialysis, was approved for dialysis at a local unit heating [place], and was discharged after IV calcium gluconate was administered. No evidence of fluid overload today. VS within normal limits -MWF HD schedule in [place] since discharge, continue -amb referral to nephrology  Hypocalcemia Patient underwent parathyroidectomy for hyperparathyroidism secondary to renal disease, with 3.5 out of 4 glands removed. Patient is currently requiring IV calcium infusions during HD and p.o. supplementation.  After hospitalization, patient is to continue p.o. calcium supplementation per Nephrology and continue IV supplementation at hemodialysis. No paresthesias, spasms, stiffness today.  Usually patient feels eye twitching and muscle cramps Last Ca at HD 6.9.  Patient received hecterol and ca bath yesterday -92mcg calcitriol -Tums 1600 mg CaCO3 tid between meals -Calcium replacement prn at hD.    Hypotension Patient's blood pressure has been on the low end of normal, requiring midodrine prior to HD, which is why pateint has not been able to ger to EDW. Otherwise patient's BP has been wnl and she remains asymptomatic  -Continue midodrine prior to HD  Chronic pain syndrome Slip and fall in 2018. Seen in the pain clinic, managed with opiates and muscle relaxants. Taking cyclobenzaprine and oxycodone 2, 10 mg up to 3 tablets daily everyday  PRN. Q 6 hrs, especially on days she has dialysis. Patient is getting 75 pills monthly. Called [facility] on [address], [place] to verify refill history as patient recently moved to [facility] and PDMP did not have recent history. Patient has historically used that pharmacy without problems or red flags.  Patient's pain has been controlled and stable on this regimen since  2018. Reviewed pain contract and signed. -Continue Cyclobenzaprine 10 mg BID PRN -Continue Percocet 10-325 mg PRN, total 75 doses monthly  Health care maintenance Hbg 13 Flu shot today Last pap in 2022, not cotested. Will need PAP and HPV cotesting in 2025 Lipid panel today No hx of diabetes or hyperglycemia Amb referral for eye exam today Other: Feels safe but there is an uncomfortable tension with the move from [place]. Patient moved to [facility] as she feels the care would be better here. 1 daughter in college in [place], in her third year No food insecurity Feels safe but there is an uncomfortable tension with the move from [place]. Patient moved to [facility] as she feels the care would be better here.  Hyperkalemia Potassium to 6.3 a recent hospitalization after not able to get dialysis. Patient received 1 dose of calcium gluconate prior to discharge.  Her potassium normalized to 4.9.    Patient seen  with Dr. Philipp Ovens

## 2022-08-29 NOTE — Assessment & Plan Note (Signed)
Slip and fall in 2018. Seen in the pain clinic, managed with opiates and muscle relaxants. Taking cyclobenzaprine and oxycodone 2, 10 mg up to 3 tablets daily everyday  PRN. Q 6 hrs, especially on days she has dialysis. Patient is getting 75 pills monthly. Called [facility] on [address], [place] to verify refill history as patient recently moved to [facility] and PDMP did not have recent history. Patient has historically used that pharmacy without problems or red flags.  Patient's pain has been controlled and stable on this regimen since 2018. Reviewed pain contract and signed. -Continue Cyclobenzaprine 10 mg BID PRN -Continue Percocet 10-325 mg PRN, total 75 doses monthly

## 2022-08-29 NOTE — Progress Notes (Signed)
Subjective:   Sandy [name] is a 46 y.o. female who presents for an Initial Medicare Annual Wellness Visit. I connected with  Tawnya Crook on 08/29/22 by a  Face-To-Face  enabled telemedicine application and verified that I am speaking with the correct person using two identifiers.  Patient Location: Other:  Office/Clinic  Provider Location: Office/Clinic  I discussed the limitations of evaluation and management by telemedicine. The patient expressed understanding and agreed to proceed.  Review of Systems    Defer to PCP       Objective:    Today's Vitals   08/29/22 1107 08/29/22 1108  BP: 104/76   Pulse: 92   SpO2: 100%   Weight: 197 lb 14.4 oz (89.8 kg)   Height: 5\' 6"  (1.676 m)   PainSc:  8    Body mass index is 31.94 kg/m.     08/29/2022   11:09 AM 08/29/2022    9:00 AM 08/04/2022    3:21 PM 08/03/2022    4:12 PM 01/06/2016    1:58 AM 01/05/2016    4:41 PM 11/22/2015    1:17 PM  Advanced Directives  Does Patient Have a Medical Advance Directive? No No No No No No No  Would patient like information on creating a medical advance directive? No - Patient declined No - Patient declined No - Patient declined No - Patient declined No - patient declined information No - patient declined information     Current Medications (verified) Outpatient Encounter Medications as of 08/29/2022  Medication Sig   calcium carbonate (TUMS - DOSED IN MG ELEMENTAL CALCIUM) 500 MG chewable tablet Chew 8 tablets (1,600 mg of elemental calcium total) by mouth 3 (three) times daily between meals.   cyclobenzaprine (FLEXERIL) 10 MG tablet Take 1 tablet (10 mg total) by mouth in the morning and at bedtime. Do not drive while on this medication   midodrine (PROAMATINE) 10 MG tablet Take 10 mg by mouth 3 (three) times a week. Take 1 tablet by mouth before dialysis on Tue, Thurs, Sat   oxyCODONE-acetaminophen (PERCOCET) 10-325 MG tablet Take 1 tablet by mouth 3 (three) times daily as needed for pain.    VELTASSA 8.4 g packet Take 1 packet by mouth daily as needed (for hyperkalemia).   No facility-administered encounter medications on file as of 08/29/2022.    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)   Past Surgical History:  Procedure Laterality Date   AV FISTULA PLACEMENT Left 11/24/2015   Procedure: ARTERIOVENOUS (AV) FISTULA CREATION-LEFT UPPER ARM;  Surgeon: Rosetta Posner, MD;  Location: [facility] OR;  Service: Vascular;  Laterality: Left;   AV FISTULA PLACEMENT Left 01/15/2016   Procedure: Embolectomy of Left Arm Fistula with revision of Brachiocephalic fistula and vein patch angioplasty;  Surgeon: Rosetta Posner, MD;  Location: [facility];  Service: Vascular;  Laterality: Left;   AV FISTULA PLACEMENT Right 01/17/2016   Procedure: Right Arm Brachial ARTERIOVENOUS FISTULA CREATION ;  Surgeon: Rosetta Posner, MD;  Location: [facility];  Service: Vascular;  Laterality: Right;   CESAREAN SECTION     INSERTION OF DIALYSIS CATHETER Right 01/08/2016   Procedure: INSERTION OF DIALYSIS CATHETER L;  Surgeon: Rosetta Posner, MD;  Location: [facility];  Service: Vascular;  Laterality: Right;  RENAL BIOPSY     Family History  Problem Relation Age of Onset   Diabetes Father    Social History   Socioeconomic History   Marital status: Single    Spouse name: Not on file   Number of children: Not on file   Years of education: Not on file   Highest education level: Not on file  Occupational History   Not on file  Tobacco Use   Smoking status: Former    Packs/day: 0.25    Types: Cigarettes    Quit date: 12/23/2015    Years since quitting: 6.6   Smokeless tobacco: Never  Substance and Sexual Activity   Alcohol use: No    Alcohol/week: 0.0 standard drinks of alcohol   Drug use: No   Sexual activity: Not on file  Other  Topics Concern   Not on file  Social History Narrative   Not on file   Social Determinants of Health   Financial Resource Strain: High Risk (08/29/2022)   Overall Financial Resource Strain (CARDIA)    Difficulty of Paying Living Expenses: Very hard  Food Insecurity: Food Insecurity Present (08/29/2022)   Hunger Vital Sign    Worried About [place] in the Last Year: Often true    Ran Out of Food in the Last Year: Often true  Transportation Needs: No Transportation Needs (08/29/2022)   PRAPARE - Hydrologist (Medical): No    Lack of Transportation (Non-Medical): No  Physical Activity: Insufficiently Active (08/29/2022)   Exercise Vital Sign    Days of Exercise per Week: 1 day    Minutes of Exercise per Session: 20 min  Stress: Stress Concern Present (08/29/2022)   [place]    Feeling of Stress : Very much  Social Connections: Moderately Integrated (08/29/2022)   Social Connection and Isolation Panel [NHANES]    Frequency of Communication with Friends and Family: Twice a week    Frequency of Social Gatherings with Friends and Family: Twice a week    Attends Religious Services: 1 to 4 times per year    Active Member of [employer] or Organizations: No    Attends Music therapist: Never    Marital Status: Married    Tobacco Counseling Counseling given: Not Answered   Clinical Intake:  Pre-visit preparation completed: Yes  Pain : 0-10 Pain Score: 8  Pain Type: Chronic pain Pain Location: Neck (neck and back) Pain Onset: More than a month ago Pain Frequency: Constant     Nutritional Risks: None Diabetes: No  How often do you need to have someone help you when you read instructions, pamphlets, or other written materials from your doctor or pharmacy?: 1 - Never What is the last grade level you completed in school?: 12th grade  Diabetic?No  Interpreter Needed?:  No  Information entered by :: Corey Skains Ada Holness,cma 08/29/22 11:09am   Activities of Daily Living    08/29/2022   11:10 AM 08/29/2022    9:00 AM  In your present state of health, do you have any difficulty performing the following activities:  Hearing? 0 0  Vision? 1 1  Difficulty concentrating or making decisions? 0 0  Walking or climbing stairs? 0 0  Dressing or bathing? 0 0  Doing errands, shopping? 0 0    Patient Care Team: Romana Juniper, MD as PCP - General Corliss Parish, MD as Consulting Physician (Nephrology)  Indicate any recent Medical Services  you may have received from other than [facility] providers in the past year (date may be approximate).     Assessment:   This is a routine wellness examination for [place].  Hearing/Vision screen No results found.  Dietary issues and exercise activities discussed:     Goals Addressed   None   Depression Screen    08/29/2022   11:09 AM 08/29/2022   10:12 AM  PHQ 2/9 Scores  PHQ - 2 Score 6 6  PHQ- 9 Score 13 13    Fall Risk    08/29/2022   11:09 AM 08/29/2022    8:59 AM  Fall Risk   Falls in the past year? 1 1  Number falls in past yr: 0 0  Injury with Fall? 1 1  Risk for fall due to : Other (Comment) No Fall Risks  Risk for fall due to: Comment fell in 2018, tripped over her shoe fell in 2018 and tripped over her shoe  Follow up Falls evaluation completed;Falls prevention discussed Falls prevention discussed;Falls evaluation completed    FALL RISK PREVENTION PERTAINING TO THE HOME:  Any stairs in or around the home? Yes  If so, are there any without handrails? No  Home free of loose throw rugs in walkways, pet beds, electrical cords, etc? No  Adequate lighting in your home to reduce risk of falls? Yes   ASSISTIVE DEVICES UTILIZED TO PREVENT FALLS:  Life alert? No  Use of a cane, walker or w/c? No  Grab bars in the bathroom? No  Shower chair or bench in shower? No  Elevated toilet seat or a  handicapped toilet? No   TIMED UP AND GO:  Was the test performed? Yes .  Length of time to ambulate 10 feet: 1 min.   Gait slow and steady without use of assistive device  Cognitive Function:        08/29/2022   11:10 AM  6CIT Screen  What Year? 0 points  What month? 0 points  What time? 0 points  Count back from 20 0 points  Months in reverse 0 points  Repeat phrase 0 points  Total Score 0 points    Immunizations Immunization History  Administered Date(s) Administered   Influenza,inj,Quad PF,6+ Mos 08/29/2022    TDAP status: Due, Education has been provided regarding the importance of this vaccine. Advised may receive this vaccine at local pharmacy or Health Dept. Aware to provide a copy of the vaccination record if obtained from local pharmacy or Health Dept. Verbalized acceptance and understanding.  Flu Vaccine status: Up to date  Pneumococcal vaccine status: Due, Education has been provided regarding the importance of this vaccine. Advised may receive this vaccine at local pharmacy or Health Dept. Aware to provide a copy of the vaccination record if obtained from local pharmacy or Health Dept. Verbalized acceptance and understanding.  Covid-19 vaccine status: Information provided on how to obtain vaccines.   Qualifies for Shingles Vaccine? No   Zostavax completed No   Shingrix Completed?: No.    Education has been provided regarding the importance of this vaccine. Patient has been advised to call insurance company to determine out of pocket expense if they have not yet received this vaccine. Advised may also receive vaccine at local pharmacy or Health Dept. Verbalized acceptance and understanding.  Screening Tests Health Maintenance  Topic Date Due   COVID-19 Vaccine (1) Never done   DTaP/Tdap/Td (1 - Tdap) Never done   PAP SMEAR-Modifier  Never done  Medicare Annual Wellness (AWV)  08/30/2023   INFLUENZA VACCINE  Completed   Hepatitis C Screening  Completed    HIV Screening  Completed   HPV VACCINES  Aged Out    Health Maintenance  Health Maintenance Due  Topic Date Due   COVID-19 Vaccine (1) Never done   DTaP/Tdap/Td (1 - Tdap) Never done   PAP SMEAR-Modifier  Never done    Colorectal Cancer Screening:Patient Due  Mammogram Status:Patient Due    Lung Cancer Screening: (Low Dose CT Chest recommended if Age 52-80 years, 30 pack-year currently smoking OR have quit w/in 15years.) does not qualify.   Lung Cancer Screening Referral: N/A  Additional Screening:  Hepatitis C Screening: does not qualify; Completed 08/05/2022  Vision Screening: Recommended annual ophthalmology exams for early detection of glaucoma and other disorders of the eye. Is the patient up to date with their annual eye exam?  No  Who is the provider or what is the name of the office in which the patient attends annual eye exams? N/A If pt is not established with a provider, would they like to be referred to a provider to establish care? No .   Dental Screening: Recommended annual dental exams for proper oral hygiene  Community Resource Referral / Chronic Care Management: CRR required this visit?  Yes   CCM required this visit?  No      Plan:     I have personally reviewed and noted the following in the patient's chart:   Medical and social history Use of alcohol, tobacco or illicit drugs  Current medications and supplements including opioid prescriptions. Patient is currently taking opioid prescriptions. Information provided to patient regarding non-opioid alternatives. Patient advised to discuss non-opioid treatment plan with their provider. Functional ability and status Nutritional status Physical activity Advanced directives List of other physicians Hospitalizations, surgeries, and ER visits in previous 12 months Vitals Screenings to include cognitive, depression, and falls Referrals and appointments  In addition, I have reviewed and discussed  with patient certain preventive protocols, quality metrics, and best practice recommendations. A written personalized care plan for preventive services as well as general preventive health recommendations were provided to patient.     Kerin Perna, [facility]   08/29/2022   Nurse Notes: Face-To-Face Visit / Ms. Mindel , Thank you for taking time to come for your Medicare Wellness Visit. I appreciate your ongoing commitment to your health goals. Please review the following plan we discussed and let me know if I can assist you in the future.   These are the goals we discussed:  Goals   None     This is a list of the screening recommended for you and due dates:  Health Maintenance  Topic Date Due   COVID-19 Vaccine (1) Never done   DTaP/Tdap/Td vaccine (1 - Tdap) Never done   Pap Smear  Never done   Medicare Annual Wellness Visit  08/30/2023   Flu Shot  Completed   Hepatitis C Screening: USPSTF Recommendation to screen - Ages 18-79 yo.  Completed   HIV Screening  Completed   HPV Vaccine  Aged Out

## 2022-08-29 NOTE — Assessment & Plan Note (Signed)
ESRD of unknown etiology. Patient mentions she's never been diagnosed with HTN or DM. She underwent kidney biopsy in [place]. She does not recall the results but that her kidney function declined rapidly. HD schedule TTS is 2018.  However, patient removed from [place], [place] to [place] and was unable to get dialysis at the local HD unit.  Patient presented to the ED on 11/12.  K of 6.3, BUN of 95, bicarb of 18.  Nephrology saw patient and patient underwent hemodialysis on 11/14 and 11/16.  Patient was discharged after the second hemodialysis, was approved for dialysis at a local unit heating [place], and was discharged after IV calcium gluconate was administered. No evidence of fluid overload today. VS within normal limits -MWF HD schedule in [place] since discharge, continue -amb referral to nephrology

## 2022-08-29 NOTE — Addendum Note (Signed)
Addended by: Simeon Craft, Verdis Frederickson on: 08/29/2022 01:30 PM   Modules accepted: Orders

## 2022-08-29 NOTE — Assessment & Plan Note (Addendum)
Hbg 13 Flu shot today Last pap in 2022, not cotested. Will need PAP and HPV cotesting in 2025 Lipid panel today No hx of diabetes or hyperglycemia Amb referral for eye exam today Other: Feels safe but there is an uncomfortable tension with the move from [place]. Patient moved to [place] as she feels the care would be better here. 1 daughter in college in [place], in her third year No food insecurity Feels safe but there is an uncomfortable tension with the move from [place]. Patient moved to [facility] as she feels the care would be better here.

## 2022-08-29 NOTE — Patient Instructions (Signed)
Thank you, Ms.Hennessy Rubel for allowing [place] to provide your care today. Today we discussed kidney disease, hypocalcemia, chronic pain, and healthcare maintenance.    I have ordered the following labs for you:  Lab Orders         Lipid Profile       I will call if any are abnormal. All of your labs can be accessed through "My Chart".  I have place a referrals to nephrology and ophthalmology  My Chart Access: [email or web address]  Please follow-up in in the next few weeks.  Please make sure to arrive 15 minutes prior to your next appointment. If you arrive late, you may be asked to reschedule.    We look forward to seeing you next time. Please call our clinic at [phone] if you have any questions or concerns. The best time to call is Monday-Friday from 9am-4pm, but there is someone available 24/7. If after hours or the weekend, call the main hospital number and ask for the Internal Medicine Resident On-Call. If you need medication refills, please notify your pharmacy one week in advance and they will send [place] a request.   Thank you for letting us take part in your care. Wishing you the best!  Romana Juniper, MD 08/29/2022, 9:50 AM [facility] Internal Medicine Resident, PGY-1

## 2022-08-29 NOTE — Assessment & Plan Note (Signed)
Patient's blood pressure has been on the low end of normal, requiring midodrine prior to HD, which is why pateint has not been able to ger to EDW. Otherwise patient's BP has been wnl and she remains asymptomatic  -Continue midodrine prior to HD

## 2022-08-30 ENCOUNTER — Telehealth: Payer: Self-pay | Admitting: *Deleted

## 2022-08-30 LAB — LIPID PANEL
Chol/HDL Ratio: 4.3 ratio (ref 0.0–4.4)
Cholesterol, Total: 152 mg/dL (ref 100–199)
HDL: 35 mg/dL — ABNORMAL LOW (ref >39)
LDL Chol Calc (NIH): 90 mg/dL (ref 0–99)
Triglycerides: 151 mg/dL — ABNORMAL HIGH (ref 0–149)
VLDL Cholesterol Cal: 27 mg/dL (ref 5–40)

## 2022-08-30 NOTE — Progress Notes (Unsigned)
  Care Coordination  Outreach Note  08/30/2022 Name: Sandy [name] MRN: [id number] DOB: 01-25-1976   Care Coordination Outreach Attempts: An unsuccessful telephone outreach was attempted today to offer the patient information about available care coordination services as a benefit of their health plan.   Follow Up Plan:  Additional outreach attempts will be made to offer the patient care coordination information and services.   Encounter Outcome:  No Answer  [place]  Direct Dial: [phone]

## 2022-08-31 NOTE — Progress Notes (Signed)
Patient called, patient aware. No changes at this time

## 2022-09-02 NOTE — Progress Notes (Signed)
  Care Coordination  Outreach Note  09/02/2022 Name: Sandy [name] MRN: [id number] DOB: 11-12-1975   Care Coordination Outreach Attempts: A second unsuccessful outreach was attempted today to offer the patient with information about available care coordination services as a benefit of their health plan.     Follow Up Plan:  Additional outreach attempts will be made to offer the patient care coordination information and services.   Encounter Outcome:  No Answer  [place]  Direct Dial: [phone]

## 2022-09-02 NOTE — Progress Notes (Signed)
Internal Medicine Clinic Attending  I saw and evaluated the patient.  I personally confirmed the key portions of the history and exam documented by Dr. Gomez-Caraballo and I reviewed pertinent patient test results.  The assessment, diagnosis, and plan were formulated together and I agree with the documentation in the resident's note.  

## 2022-09-03 ENCOUNTER — Encounter: Payer: Self-pay | Admitting: Student

## 2022-09-03 NOTE — Progress Notes (Signed)
  Care Coordination   Note   09/03/2022 Name: Sandy [name] MRN: [id number] DOB: [birth year]  [name] is a 46 y.o. year old female who sees Romana Juniper, MD for primary care. I reached out to Tawnya Crook by phone today to offer care coordination services.  [name] was given information about Care Coordination services today including:   The Care Coordination services include support from the care team which includes your Nurse Coordinator, Clinical Social Worker, or Pharmacist.  The Care Coordination team is here to help remove barriers to the health concerns and goals most important to you. Care Coordination services are voluntary, and the patient may decline or stop services at any time by request to their care team member.   Care Coordination Consent Status: Patient agreed to services and verbal consent obtained.   Follow up plan:  Telephone appointment with care coordination team member scheduled for:  09/06/22  Encounter Outcome:  Pt. Scheduled  [place]  Direct Dial: [phone]

## 2022-09-06 ENCOUNTER — Ambulatory Visit: Payer: Self-pay

## 2022-09-06 NOTE — Patient Outreach (Signed)
  Care Coordination   Initial Visit Note   09/06/2022 Name: Sandy [name] MRN: [id number] DOB: [birth year]  Sandy [name] is a 46 y.o. year old female who sees Romana Juniper, MD for primary care. I spoke with  Tawnya Crook by phone today.  What matters to the patients health and wellness today?  Resource Education    Goals Addressed             This Visit's Progress    COMPLETED: Care Coordination Activities       Care Coordination Interventions: SDoH screening performed - identified challenges with food insecurity and housing Discussed the patient moved to [place] to live with her sister last month. Patients disability is being garnished due to over payment last year. Patient working to appeal but in the meantime is only receiving $234 per  month. Patients spouse also lives with her in her sisters home and has recently started a job at [employer]. Patient reports she feels like she has over stayed her welcome. Sister is reportedly turning off the thermostat at times which causes the home to be cold. Patient has not asked why this is occurring as she feels it may cause an argument Confirmed patient is interested in applying for Food and Nutrition Services via [employer] - website provided Determined the patient and her spouse have thought about getting an apartment but they are unable to afford one at this time Education provided to the patient on limited housing options in the area - advised the patient to contact [employer] to determine if any resources are available Provided the patient with information on [place] to continue looking for places to rent that she and her spouse can afford Patient to remain in touch with social security regarding wage garnish and appeal         SDOH assessments and interventions completed:  Yes  SDOH Interventions Today    Flowsheet Row Most Recent Value  SDOH Interventions   Food Insecurity Interventions Assist with [employer]  Application  Housing Interventions Other (Comment)  [Referred to [place]  Transportation Interventions Intervention Not Indicated  Utilities Interventions --  [lives with sister,  sister turns off thermostat often while patient is in the home]  Financial Strain Interventions --  [FNS application, referred to [place] Coordination Interventions:  Yes, provided   Follow up plan: No further intervention required.   Encounter Outcome:  Pt. Visit Completed   Daneen Schick, BSW, CDP Social Worker, Certified Dementia Practitioner [facility]  Care Coordination [phone]

## 2022-09-06 NOTE — Patient Instructions (Signed)
Visit Information  Thank you for taking time to visit with me today. Please don't hesitate to contact me if I can be of assistance to you.   Following are the goals we discussed today:   Goals Addressed             This Visit's Progress    COMPLETED: Care Coordination Activities       Care Coordination Interventions: SDoH screening performed - identified challenges with food insecurity and housing Discussed the patient moved to [place] to live with her sister last month. Patients disability is being garnished due to over payment last year. Patient working to appeal but in the meantime is only receiving $234 per  month. Patients spouse also lives with her in her sisters home and has recently started a job at [employer]. Patient reports she feels like she has over stayed her welcome. Sister is reportedly turning off the thermostat at times which causes the home to be cold. Patient has not asked why this is occurring as she feels it may cause an argument Confirmed patient is interested in applying for Food and Nutrition Services via [employer] - website provided Determined the patient and her spouse have thought about getting an apartment but they are unable to afford one at this time Education provided to the patient on limited housing options in the area - advised the patient to contact [employer] to determine if any resources are available Provided the patient with information on [place] to continue looking for places to rent that she and her spouse can afford Patient to remain in touch with social security regarding wage garnish and appeal         If you are experiencing a [place] or [place] or need someone to talk to, please go to [facility] [address], [place] [phone]) call [phone]  Patient verbalizes understanding of instructions and care plan provided today and agrees to view in [place]. Active  MyChart status and patient understanding of how to access instructions and care plan via MyChart confirmed with patient.     No further follow up required: Please contact your primary care provider as needed  Daneen Schick, Arita Miss, CDP Social Worker, Certified Dementia Practitioner [place] Coordination [phone]

## 2022-09-25 ENCOUNTER — Other Ambulatory Visit (HOSPITAL_COMMUNITY): Payer: Self-pay

## 2022-09-25 ENCOUNTER — Other Ambulatory Visit: Payer: Self-pay | Admitting: Internal Medicine

## 2022-09-25 DIAGNOSIS — F1190 Opioid use, unspecified, uncomplicated: Secondary | ICD-10-CM

## 2022-09-26 ENCOUNTER — Telehealth: Payer: Self-pay

## 2022-09-26 NOTE — Telephone Encounter (Signed)
This request has been sent in 2 different encounters.

## 2022-09-26 NOTE — Telephone Encounter (Signed)
Requesting refill on   oxyCODONE-acetaminophen (PERCOCET) 10-325 MG tablet

## 2022-09-27 ENCOUNTER — Other Ambulatory Visit: Payer: Self-pay

## 2022-09-27 ENCOUNTER — Other Ambulatory Visit (HOSPITAL_COMMUNITY): Payer: Self-pay

## 2022-09-27 MED ORDER — OXYCODONE-ACETAMINOPHEN 10-325 MG PO TABS
1.0000 | ORAL_TABLET | Freq: Three times a day (TID) | ORAL | 0 refills | Status: DC | PRN
  Filled 2022-09-27: qty 75, 30d supply, fill #0

## 2022-09-28 ENCOUNTER — Encounter: Payer: Self-pay | Admitting: Student

## 2022-09-28 ENCOUNTER — Other Ambulatory Visit (HOSPITAL_COMMUNITY): Payer: Self-pay

## 2022-09-30 ENCOUNTER — Emergency Department (HOSPITAL_COMMUNITY)
Admission: EM | Admit: 2022-09-30 | Discharge: 2022-10-01 | Discharge status: Against Medical Advice | Payer: Medicare Other | Attending: Physician Assistant | Admitting: Physician Assistant

## 2022-09-30 ENCOUNTER — Other Ambulatory Visit (HOSPITAL_COMMUNITY): Payer: Self-pay

## 2022-09-30 DIAGNOSIS — Z5321 Procedure and treatment not carried out due to patient leaving prior to being seen by health care provider: Secondary | ICD-10-CM | POA: Insufficient documentation

## 2022-09-30 DIAGNOSIS — R112 Nausea with vomiting, unspecified: Secondary | ICD-10-CM | POA: Insufficient documentation

## 2022-09-30 DIAGNOSIS — Z992 Dependence on renal dialysis: Secondary | ICD-10-CM | POA: Diagnosis not present

## 2022-09-30 LAB — CBC WITH DIFFERENTIAL/PLATELET
Abs Immature Granulocytes: 0.04 10*3/uL (ref 0.00–0.07)
Basophils Absolute: 0.1 10*3/uL (ref 0.0–0.1)
Basophils Relative: 1 %
Eosinophils Absolute: 0 10*3/uL (ref 0.0–0.5)
Eosinophils Relative: 0 %
HCT: 53.4 % — ABNORMAL HIGH (ref 36.0–46.0)
Hemoglobin: 15.3 g/dL — ABNORMAL HIGH (ref 12.0–15.0)
Immature Granulocytes: 0 %
Lymphocytes Relative: 12 %
Lymphs Abs: 1.1 10*3/uL (ref 0.7–4.0)
MCH: 26.7 pg (ref 26.0–34.0)
MCHC: 28.7 g/dL — ABNORMAL LOW (ref 30.0–36.0)
MCV: 93 fL (ref 80.0–100.0)
Monocytes Absolute: 0.6 10*3/uL (ref 0.1–1.0)
Monocytes Relative: 6 %
Neutro Abs: 7.8 10*3/uL — ABNORMAL HIGH (ref 1.7–7.7)
Neutrophils Relative %: 81 %
Platelets: 380 10*3/uL (ref 150–400)
RBC: 5.74 MIL/uL — ABNORMAL HIGH (ref 3.87–5.11)
RDW: 17.2 % — ABNORMAL HIGH (ref 11.5–15.5)
WBC: 9.7 10*3/uL (ref 4.0–10.5)
nRBC: 0 % (ref 0.0–0.2)

## 2022-09-30 LAB — COMPREHENSIVE METABOLIC PANEL
ALT: 11 U/L (ref 0–44)
AST: 17 U/L (ref 15–41)
Albumin: 3.9 g/dL (ref 3.5–5.0)
Alkaline Phosphatase: 463 U/L — ABNORMAL HIGH (ref 38–126)
Anion gap: 15 (ref 5–15)
BUN: 29 mg/dL — ABNORMAL HIGH (ref 6–20)
CO2: 27 mmol/L (ref 22–32)
Calcium: 7.6 mg/dL — ABNORMAL LOW (ref 8.9–10.3)
Chloride: 97 mmol/L — ABNORMAL LOW (ref 98–111)
Creatinine, Ser: 8.25 mg/dL — ABNORMAL HIGH (ref 0.44–1.00)
GFR, Estimated: 6 mL/min — ABNORMAL LOW (ref >60)
Glucose, Bld: 92 mg/dL (ref 70–99)
Potassium: 5.2 mmol/L — ABNORMAL HIGH (ref 3.5–5.1)
Sodium: 139 mmol/L (ref 135–145)
Total Bilirubin: 0.9 mg/dL (ref 0.3–1.2)
Total Protein: 8.1 g/dL (ref 6.5–8.1)

## 2022-09-30 LAB — LIPASE, BLOOD: Lipase: 46 U/L (ref 11–51)

## 2022-09-30 MED ORDER — ONDANSETRON 4 MG PO TBDP
4.0000 mg | ORAL_TABLET | Freq: Once | ORAL | Status: AC
  Administered 2022-09-30: 4 mg via ORAL
  Filled 2022-09-30: qty 1

## 2022-09-30 NOTE — ED Provider Triage Note (Signed)
Emergency Medicine Provider Triage Evaluation Note  Sandy [name] , a 47 y.o. female  was evaluated in triage.  Pt complains of vomiting after dialysis  Pt had zofran but it has worn off.   Review of Systems  Positive: Abdominal pain and nausea  Negative: Fever   Physical Exam  BP (!) 124/94 (BP Location: Right Arm)   Pulse 100   Temp 97.9 F (36.6 C) (Oral)   Resp 16   SpO2 100%  Gen:   Awake, no distress   Resp:  Normal effort  MSK:   Moves extremities without difficulty  Other:    Medical Decision Making  Medically screening exam initiated at 4:49 PM.  Appropriate orders placed.  Sandy [name] was informed that the remainder of the evaluation will be completed by another provider, this initial triage assessment does not replace that evaluation, and the importance of remaining in the ED until their evaluation is complete.     Sandy [name], [place] 09/30/22 1650

## 2022-09-30 NOTE — ED Triage Notes (Signed)
Patient here with complaint of nausea and vomiting that started earlier this morning. Patient completed full dialysis treatment today.

## 2022-09-30 NOTE — ED Notes (Signed)
Pt called for repeat vitals, no answer 

## 2022-09-30 NOTE — ED Notes (Signed)
Pt was called for vitals, no answer.

## 2022-10-25 ENCOUNTER — Other Ambulatory Visit: Payer: Self-pay

## 2022-10-28 ENCOUNTER — Other Ambulatory Visit: Payer: Self-pay

## 2022-10-28 ENCOUNTER — Other Ambulatory Visit: Payer: Self-pay | Admitting: Internal Medicine

## 2022-10-28 DIAGNOSIS — F1190 Opioid use, unspecified, uncomplicated: Secondary | ICD-10-CM

## 2022-10-28 DIAGNOSIS — G894 Chronic pain syndrome: Secondary | ICD-10-CM

## 2022-10-28 MED ORDER — CYCLOBENZAPRINE HCL 10 MG PO TABS: 10.0000 mg | ORAL_TABLET | Freq: Two times a day (BID) | ORAL | 11 refills | Status: AC

## 2022-10-28 MED ORDER — OXYCODONE-ACETAMINOPHEN 10-325 MG PO TABS: 1.0000 | ORAL_TABLET | Freq: Three times a day (TID) | ORAL | 0 refills | Status: DC | PRN

## 2022-11-26 ENCOUNTER — Encounter: Payer: Self-pay | Admitting: Student

## 2022-11-27 ENCOUNTER — Other Ambulatory Visit: Payer: Self-pay | Admitting: Student

## 2022-11-27 DIAGNOSIS — F1190 Opioid use, unspecified, uncomplicated: Secondary | ICD-10-CM

## 2022-11-27 MED ORDER — OXYCODONE-ACETAMINOPHEN 10-325 MG PO TABS: 1.0000 | ORAL_TABLET | Freq: Three times a day (TID) | ORAL | 0 refills | Status: DC | PRN

## 2022-11-28 ENCOUNTER — Encounter: Payer: Self-pay | Admitting: Student

## 2022-11-28 ENCOUNTER — Ambulatory Visit (INDEPENDENT_AMBULATORY_CARE_PROVIDER_SITE_OTHER): Payer: Medicare Other | Admitting: Student

## 2022-11-28 VITALS — BP 116/86 | HR 110 | Ht 66.0 in | Wt 203.3 lb

## 2022-11-28 DIAGNOSIS — G894 Chronic pain syndrome: Secondary | ICD-10-CM

## 2022-11-28 DIAGNOSIS — I953 Hypotension of hemodialysis: Secondary | ICD-10-CM

## 2022-11-28 DIAGNOSIS — N186 End stage renal disease: Secondary | ICD-10-CM

## 2022-11-28 DIAGNOSIS — Z0000 Encounter for general adult medical examination without abnormal findings: Secondary | ICD-10-CM

## 2022-11-28 DIAGNOSIS — Z992 Dependence on renal dialysis: Secondary | ICD-10-CM

## 2022-11-28 DIAGNOSIS — R748 Abnormal levels of other serum enzymes: Secondary | ICD-10-CM | POA: Insufficient documentation

## 2022-11-28 NOTE — Assessment & Plan Note (Addendum)
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  She has had biopsy done without any conclusive answers.  One of the theories was that she was taking phentermine and ibuprofen daily when she was pregnant.   She is getting HD on Monday Wednesday and Friday.  She last went yesterday.  Her dry weight is around 87 - 89 kg.  Her weight today is 92 kg but she has no obvious signs of volume overload.  She is getting blood work frequently at the dialysis center and last lab drawn was yesterday.  She states that her lab result are okay.  -Continues to follow with nephrology and HD center as scheduled

## 2022-11-28 NOTE — Progress Notes (Signed)
Internal Medicine Clinic Attending  Case discussed with Dr. Khan  At the time of the visit.  We reviewed the resident's history and exam and pertinent patient test results.  I agree with the assessment, diagnosis, and plan of care documented in the resident's note.  

## 2022-11-28 NOTE — Assessment & Plan Note (Addendum)
Alkaline phosphatase level has been elevated since 2017.  His most recent CMP in January showed alk phos of 463.  This could be from her bone disease secondary to hypocalcemia vs cholestasis.   Right upper quadrant ultrasound 2017 showed common bile duct dilatation measuring up to 11 mm in the porta hepatis without any obvious stones.  Patient has no GI symptoms.  -Recheck LFT and GGT. -If confirmed hepatocellular/cholestatic source, will repeat right upper quadrant ultrasound

## 2022-11-28 NOTE — Assessment & Plan Note (Signed)
-  Patient is not ready for Pap smear today.  Will do that at next visit

## 2022-11-28 NOTE — Assessment & Plan Note (Addendum)
Patient has fistula of bilateral upper arms.  She currently using the left.  She said that the right fistula has scarred.  She typically have her blood pressure checked on her right wrist with a pediatric cuff.  Her blood pressure today is 116/86.  Patient is asymptomatic.

## 2022-11-28 NOTE — Assessment & Plan Note (Signed)
Patient is getting IV calcium gluconate and vitamin D at her HD center.  She is also taking Tums 3 times a day for calcium.  She said that her last calcium level was 7.6.

## 2022-11-28 NOTE — Patient Instructions (Addendum)
Ms. Modlin,  It was nice seeing you in the clinic today. Here is a summary of what we talked about:  1.  Please continue to follow-up with your dialysis center and takes your midodrine.  2.  I placed a referral to a pain clinic for you  3.  I will recheck your liver number just to confirm if this is from your calcium level or a liver issue.  Please return in 3 months.  You will need a Pap smear at that visit  Dr. Alfonse Spruce

## 2022-11-28 NOTE — Assessment & Plan Note (Addendum)
Pain contract signed last visit

## 2022-11-28 NOTE — Progress Notes (Signed)
CC: Follow-up on blood pressure and chronic pain  HPI:  [name] is a 47 y.o. living with ESRD on HD, hypocalcemia, chronic pain syndrome who presents to the clinic to follow-up on her blood pressure and pain clinic referral.  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    Appearance: She is not ill-appearing.  HENT:     Head: Normocephalic.  Eyes:     General:        Right eye: No discharge.        Left eye: No discharge.     Conjunctiva/sclera: Conjunctivae normal.  Cardiovascular:     Rate and Rhythm: Normal rate and regular rhythm.     Comments: Trace lower extremity edema Pulmonary:     Effort: Pulmonary effort is normal. No respiratory distress.     Breath sounds: Normal breath sounds. No wheezing.  Musculoskeletal:     Cervical back: Normal range of motion.  Skin:    General: Skin is warm.  Neurological:     Mental Status: She is alert. Mental status is at baseline.  Psychiatric:        Mood and Affect: Mood normal.      Assessment & Plan:   See Encounters Tab for problem based charting.  Hypotension Patient has fistula of bilateral upper arms.  She currently using the left.  She said that the right fistula has scarred.  She typically have her blood pressure checked on her right wrist with a pediatric cuff.  Her blood pressure today is 116/86.  Patient is asymptomatic.  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  She has had biopsy done without  any conclusive answers.  One of the theories was that she was taking phentermine and ibuprofen daily when she was pregnant.   She is getting HD on Monday Wednesday and Friday.  She last went yesterday.  Her dry weight is around 87 - 89 kg.  Her weight today is 92 kg but she has no obvious signs of volume overload.  She is getting blood work frequently at the dialysis center and last lab drawn was yesterday.  She states that her lab result are okay.  -Continues to follow with nephrology and HD center as scheduled  Hypocalcemia Patient is getting IV calcium gluconate and vitamin D at her HD center.  She is also taking Tums 3 times a day for calcium.  She said that her last calcium level was 7.6.  Chronic pain syndrome Pain contract signed last visit  Elevated alkaline phosphatase level Alkaline phosphatase level has been elevated since 2017.  His most recent CMP in January showed alk phos of 463.  This could be from her bone disease secondary to hypocalcemia vs cholestasis.   Right upper quadrant ultrasound 2017 showed common bile duct dilatation measuring up to 11 mm in the  porta hepatis without any obvious stones.  Patient has no GI symptoms.  -Recheck LFT and GGT. -If confirmed hepatocellular/cholestatic source, will repeat right upper quadrant ultrasound  Health care maintenance -Patient is not ready for Pap smear today.  Will do that at next visit   Patient discussed with Dr. Philipp Ovens

## 2022-11-29 LAB — HEPATIC FUNCTION PANEL
ALT: 12 IU/L (ref 0–32)
AST: 10 IU/L (ref 0–40)
Albumin: 4.2 g/dL (ref 3.9–4.9)
Alkaline Phosphatase: 228 IU/L — ABNORMAL HIGH (ref 44–121)
Bilirubin Total: 0.3 mg/dL (ref 0.0–1.2)
Bilirubin, Direct: 0.12 mg/dL (ref 0.00–0.40)
Total Protein: 7.5 g/dL (ref 6.0–8.5)

## 2022-11-29 LAB — GAMMA GT: GGT: 18 IU/L (ref 0–60)

## 2022-12-08 ENCOUNTER — Encounter: Payer: Self-pay | Admitting: Student

## 2022-12-24 ENCOUNTER — Other Ambulatory Visit: Payer: Self-pay | Admitting: Student

## 2022-12-24 DIAGNOSIS — F1190 Opioid use, unspecified, uncomplicated: Secondary | ICD-10-CM

## 2022-12-25 ENCOUNTER — Encounter: Payer: Medicaid Other | Admitting: Student

## 2022-12-25 MED ORDER — OXYCODONE-ACETAMINOPHEN 10-325 MG PO TABS: 1.0000 | ORAL_TABLET | Freq: Three times a day (TID) | ORAL | 0 refills | Status: DC | PRN

## 2022-12-25 NOTE — Telephone Encounter (Signed)
Last rx written - 11/27/22. Last OV- 11/28/22. Next OV - 4/11 with Dr Carin Primrose. No TOX

## 2023-01-02 ENCOUNTER — Encounter: Payer: Self-pay | Admitting: Student

## 2023-01-02 ENCOUNTER — Ambulatory Visit: Payer: Medicare Other | Admitting: Student

## 2023-01-02 VITALS — BP 94/67 | HR 93 | Temp 98.2°F | Ht 66.5 in | Wt 195.1 lb

## 2023-01-02 DIAGNOSIS — R739 Hyperglycemia, unspecified: Secondary | ICD-10-CM

## 2023-01-02 DIAGNOSIS — E669 Obesity, unspecified: Secondary | ICD-10-CM

## 2023-01-02 DIAGNOSIS — E663 Overweight: Secondary | ICD-10-CM

## 2023-01-02 DIAGNOSIS — Z6830 Body mass index (BMI) 30.0-30.9, adult: Secondary | ICD-10-CM

## 2023-01-02 LAB — POCT GLYCOSYLATED HEMOGLOBIN (HGB A1C): Hemoglobin A1C: 4.8 % (ref 4.0–5.6)

## 2023-01-02 LAB — GLUCOSE, CAPILLARY: Glucose-Capillary: 113 mg/dL — ABNORMAL HIGH (ref 70–99)

## 2023-01-02 NOTE — Assessment & Plan Note (Signed)
BMI 31.02 kg/m.  Without any weight related complications like diabetes, hyperlipidemia, or high blood pressure.  I am encouraged that this person is striving for healthy weight.  Medical therapy is limited.  Phentermine is not appropriate given tachycardia and ESRD.  Naltrexone-bupropion is not compatible with her oxycodone, and second line at any rate.  GLP-1 agonist would be effective, no doubt, but not likely affordable for this person.  Will talk to our pharmacy technician to see about options for this medication.  That said, she has actually done quite well since 2017 to lose 50 pounds.  Encouraged continued exercise.  Talked about time restricted feeding.  Will set up referral to weight management clinic.

## 2023-01-02 NOTE — Progress Notes (Signed)
    Subjective:  Sandy [name] is a 47 y.o. who presents to clinic for weight loss management.  She has concern about her weight today.  She has struggled with her weight in the past.  When she was pregnant with her daughter she gained 80 pounds.  After her daughter's birth she used phentermine to good effect, losing 100 pounds.  Several years passed.  She started dialysis for ESRD which was a life-changing event and was associated with a lot of depression.  She reports that she gained a lot of weight during this time.  Her weight has peaked around 250 pounds in 2017.  Now she weighs around 200 pounds.  Recently she started exercising by walking around the neighborhood.  She walks quarter to 1/2 mile per day.  These walks take around 20 minutes.  She walks every day.  She tries to eat healthy foods.  Breakfast may consist of boiled eggs and lunch a sandwich and salad, for instance.  Dinner is usually "anything she can get her hands on" at home.  Patient Active Problem List   Diagnosis Date Noted   Obesity (BMI 30-39.9) 01/02/2023   Elevated alkaline phosphatase level 11/28/2022   Hypotension 08/29/2022   Chronic pain syndrome 08/29/2022   Health care maintenance 08/29/2022   Hyperkalemia 08/05/2022   Hypocalcemia 08/05/2022   History of parathyroidectomy 08/05/2022   ESRD on dialysis 08/05/2022   Plantar fasciitis of left foot 10/23/2015   Objective:   Vitals:   01/02/23 0928 01/02/23 1030  BP: 97/75 94/67  Pulse: (!) 115 93  Temp: 98.2 F (36.8 C)   TempSrc: Oral   SpO2: 98%   Weight: 195 lb 1.6 oz (88.5 kg)   Height: 5' 6.5" (1.689 m)     Physical Exam Tired appearing Neck is supple without palpable lymphadenopathy, oral mucous membranes are moist Heart rate is tachycardic, rhythm is regular, radial pulses are thready Breathing is regular and unlabored on room air, no wheezing or crackles Skin is warm and dry Alert and oriented Pleasant with concordant  affect  Assessment & Plan:  The primary encounter diagnosis was Hyperglycemia. Diagnoses of Overweight and Obesity (BMI 30-39.9) were also pertinent to this visit.  Obesity (BMI 30-39.9) BMI 31.02 kg/m.  Without any weight related complications like diabetes, hyperlipidemia, or high blood pressure.  I am encouraged that this person is striving for healthy weight.  Medical therapy is limited.  Phentermine is not appropriate given tachycardia and ESRD.  Naltrexone-bupropion is not compatible with her oxycodone, and second line at any rate.  GLP-1 agonist would be effective, no doubt, but not likely affordable for this person.  Will talk to our pharmacy technician to see about options for this medication.  That said, she has actually done quite well since 2017 to lose 50 pounds.  Encouraged continued exercise.  Talked about time restricted feeding.  Will set up referral to weight management clinic.    Return in 3 months, for weight management, and PAP smear.  Patient discussed with Dr. Michel Bickers MD 01/02/2023, 9:04 PM  Pager: [phone]

## 2023-01-02 NOTE — Patient Instructions (Addendum)
Today we discussed weight management.  Return in 3 months, for weight management, and PAP smear.   I will call you with the results of the following laboratory test(s):  Lab Orders         POC Hbg A1C       Expect a call from the following department(s):  Referral Orders         Amb Ref to Medical Weight Management      Please call our clinic at [phone] Monday through Friday from 9 am to 4 pm if you have questions or concerns about your health. If after hours or on the weekend, call the main hospital number and ask for the Internal Medicine Resident On-Call. If you need medication refills, please notify your pharmacy one week in advance and they will send [place] a request.   Best, Marrianne Mood, MD [facility]

## 2023-01-03 ENCOUNTER — Other Ambulatory Visit (HOSPITAL_COMMUNITY): Payer: Self-pay

## 2023-01-03 NOTE — Progress Notes (Signed)
Called to inform of normal A1c. No answer. Will leave mychart message.

## 2023-01-03 NOTE — Addendum Note (Signed)
Addended by: Derrek Monaco on: 01/03/2023 10:24 AM   Modules accepted: Level of Service

## 2023-01-03 NOTE — Progress Notes (Signed)
Internal Medicine Clinic Attending  Case discussed with Dr. Benito Mccreedy  At the time of the visit.  We reviewed the resident's history and exam and pertinent patient test results.  I agree with the assessment, diagnosis, and plan of care documented in the resident's note.     Patient's A1C affected by ESRD on dialysis, but her glucoses are not necessarily indicative of a diagnosis of T2DM. I do think she is at increased risk for cardiovascular disease, and that GLP1 would be helpful in reducing her risk of cardiovascular disease, as well as weight loss.

## 2023-01-22 ENCOUNTER — Ambulatory Visit (HOSPITAL_COMMUNITY)
Admission: RE | Admit: 2023-01-22 | Discharge: 2023-01-22 | Disposition: A | Discharge status: Home / Self Care | Payer: Medicare Other | Source: Ambulatory Visit | Attending: Vascular Surgery | Admitting: Vascular Surgery

## 2023-01-22 ENCOUNTER — Other Ambulatory Visit: Payer: Self-pay | Admitting: *Deleted

## 2023-01-22 DIAGNOSIS — N186 End stage renal disease: Secondary | ICD-10-CM | POA: Diagnosis not present

## 2023-01-22 DIAGNOSIS — Z992 Dependence on renal dialysis: Secondary | ICD-10-CM

## 2023-01-25 ENCOUNTER — Encounter: Payer: Self-pay | Admitting: Student

## 2023-01-28 ENCOUNTER — Encounter: Payer: Self-pay | Admitting: Student

## 2023-01-28 ENCOUNTER — Other Ambulatory Visit: Payer: Self-pay

## 2023-01-28 DIAGNOSIS — F1190 Opioid use, unspecified, uncomplicated: Secondary | ICD-10-CM

## 2023-01-29 ENCOUNTER — Encounter: Payer: Self-pay | Admitting: Vascular Surgery

## 2023-01-29 ENCOUNTER — Ambulatory Visit (INDEPENDENT_AMBULATORY_CARE_PROVIDER_SITE_OTHER): Payer: Medicare Other | Admitting: Vascular Surgery

## 2023-01-29 ENCOUNTER — Encounter: Payer: Self-pay | Admitting: Student

## 2023-01-29 VITALS — BP 117/80 | HR 80 | Temp 98.0°F | Resp 20 | Ht 66.5 in | Wt 200.0 lb

## 2023-01-29 DIAGNOSIS — N186 End stage renal disease: Secondary | ICD-10-CM | POA: Diagnosis not present

## 2023-01-29 DIAGNOSIS — F1190 Opioid use, unspecified, uncomplicated: Secondary | ICD-10-CM

## 2023-01-29 MED ORDER — OXYCODONE-ACETAMINOPHEN 10-325 MG PO TABS: 1.0000 | ORAL_TABLET | Freq: Three times a day (TID) | ORAL | 0 refills | Status: DC | PRN

## 2023-01-29 NOTE — H&P (View-Only) (Signed)
 Patient ID: Sandy [name], female   DOB: 03/15/1976, 46 y.o.   MRN: [id number]  Reason for Consult: New Patient (Initial Visit)   Referred by Goldsborough, Kellie, MD  Subjective:     HPI:  Sandy [name] is a 46 y.o. female with history of end-stage renal disease currently on dialysis via catheter.  She has a basilic vein fistula on the left which was placed in [place] approximately 5 years ago which she was using until recently there was concern for infection and catheter was placed.  She states that it has been pulsatile she did have some swelling of her left arm but this has resolved and she denies any fevers or chills and states that she had negative blood cultures as an outpatient.  She denies any left hand pain or numbness or tingling.  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)   Family History  Problem Relation Age of Onset   Diabetes Father    Past Surgical History:  Procedure Laterality Date   AV FISTULA PLACEMENT Left 11/24/2015   Procedure: ARTERIOVENOUS (AV) FISTULA CREATION-LEFT UPPER ARM;  Surgeon: Todd F Early, MD;  Location: [facility] OR;  Service: Vascular;  Laterality: Left;   AV FISTULA PLACEMENT Left 01/15/2016   Procedure: Embolectomy of Left Arm Fistula with revision of Brachiocephalic fistula and vein patch angioplasty;  Surgeon: Todd F Early, MD;  Location: [facility] OR;  Service: Vascular;  Laterality: Left;   AV FISTULA PLACEMENT Right 01/17/2016   Procedure: Right Arm Brachial ARTERIOVENOUS FISTULA CREATION ;  Surgeon: Todd F Early, MD;  Location: [facility] OR;  Service: Vascular;  Laterality: Right;   CESAREAN SECTION     INSERTION OF DIALYSIS CATHETER Right 01/08/2016   Procedure: INSERTION OF DIALYSIS CATHETER L;  Surgeon: Todd F Early, MD;  Location: [facility] OR;  Service: Vascular;  Laterality: Right;   RENAL BIOPSY      Short Social  History:  Social History   Tobacco Use   Smoking status: Some Days    Packs/day: .25    Types: Cigarettes    Last attempt to quit: 12/23/2015    Years since quitting: 7.1   Smokeless tobacco: Never   Tobacco comments:    2-3 cigs  Substance Use Topics   Alcohol use: No    Alcohol/week: 0.0 standard drinks of alcohol    Allergies  Allergen Reactions   Lisinopril Cough    Current Outpatient Medications  Medication Sig Dispense Refill   cyclobenzaprine (FLEXERIL) 10 MG tablet Take 1 tablet (10 mg total) by mouth in the morning and at bedtime. Do not drive while on this medication 60 tablet 11   midodrine (PROAMATINE) 10 MG tablet Take 10 mg by mouth 3 (three) times a week. Take 1 tablet by mouth before dialysis on Tue, Thurs, Sat     VELTASSA 8.4 g packet Take 1 packet by mouth daily as needed (for hyperkalemia).     oxyCODONE-acetaminophen (PERCOCET) 10-325 MG tablet Take 1 tablet by mouth 3 (three) times daily as needed for pain. Take 1 tablet by mouth 3 (three) times daily as needed for pain. 30 day supply 75 tablet 0   No current facility-administered medications for this visit.    Review of Systems  Constitutional:  Constitutional negative. HENT: HENT negative.  Eyes: Eyes negative.  Respiratory:   Respiratory negative.  Cardiovascular: Cardiovascular negative.  GI: Gastrointestinal negative.  Musculoskeletal: Musculoskeletal negative.  Skin: Skin negative.  Neurological: Neurological negative. Hematologic: Hematologic/lymphatic negative.  Psychiatric: Psychiatric negative.        Objective:  Objective   Vitals:   01/29/23 1342  BP: 117/80  Pulse: 80  Resp: 20  Temp: 98 F (36.7 C)  SpO2: 98%  Weight: 200 lb (90.7 kg)  Height: 5' 6.5" (1.689 m)   Body mass index is 31.8 kg/m.  Physical Exam HENT:     Head: Normocephalic.  Eyes:     Pupils: Pupils are equal, round, and reactive to light.  Cardiovascular:     Pulses:          Radial pulses are 0 on  the left side.  Pulmonary:     Effort: Pulmonary effort is normal.  Abdominal:     General: Abdomen is flat.  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     Mental Status: She is alert.  Psychiatric:        Mood and Affect: Mood normal.     Data: Findings:  +--------------------+----------+-----------------+--------+  AVF                PSV (cm/s)Flow Vol (mL/min)Comments  +--------------------+----------+-----------------+--------+  Native artery inflow   127          1378                 +--------------------+----------+-----------------+--------+  AVF Anastomosis        274                               +--------------------+----------+-----------------+--------+     +------------+----------+------------+----------+--------------------------  ----+  OUTFLOW VEINPSV (cm/s)  Diameter  Depth (cm)           Describe                                        (cm)                                               +------------+----------+------------+----------+--------------------------  ----+  Shoulder      169        0.98       1.74           Retained valve           +------------+----------+------------+----------+--------------------------  ----+  Prox UA        464        0.57       0.98       change in Diameter  and                                                             stenotic              +------------+----------+------------+----------+--------------------------  ----+    Mid UA          19        2.03       0.13        partially-occlusive         +------------+----------+------------+----------+--------------------------  ----+  Dist UA         88        1.61       0.24         change in Diameter          +------------+----------+------------+----------+--------------------------  ----+  AC Fossa       223        0.51       0.45       calcific wall  adherent                                                             thrombus              +------------+----------+------------+----------+--------------------------  ----+        Summary:  Arteriovenous fistula-Velocities less than 100cm/s noted.  Arteriovenous fistula-Stenosis noted.   Partially occlusive thrombus in the Left AVF at the Mid-Distal upper arm  level.      Assessment/Plan:    46-year-old female currently on dialysis via left IJ tunneled catheter which was placed when there was concern for infection in her fistula.  Catheter working well fistula does not appear overtly infected she denies any fevers.  There does appear to be stenosis with possible partially occlusive thrombus in the upper part of her fistula which is consistent with the pulsatility on physical exam.  Plan will be for fistulogram on a nondialysis day in the near future with possible intervention.  I discussed that we may not be able to fully fix the fistula and may need to talk about surgical options she demonstrates good understanding we will get her scheduled for fistulogram in the near future.    Anushka Hartinger Christopher Meir Elwood MD [facility]   

## 2023-01-29 NOTE — Progress Notes (Signed)
Patient ID: Sandy [name], female   DOB: 05/21/1976, 47 y.o.   MRN: [id number]  Reason for Consult: New Patient (Initial Visit)   Referred by Annie Sable, MD  Subjective:     HPI:  Sandy [name] is a 47 y.o. female with history of end-stage renal disease currently on dialysis via catheter.  She has a basilic vein fistula on the left which was placed in [place] approximately 5 years ago which she was using until recently there was concern for infection and catheter was placed.  She states that it has been pulsatile she did have some swelling of her left arm but this has resolved and she denies any fevers or chills and states that she had negative blood cultures as an outpatient.  She denies any left hand pain or numbness or tingling.  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)   Family History  Problem Relation Age of Onset   Diabetes Father    Past Surgical History:  Procedure Laterality Date   AV FISTULA PLACEMENT Left 11/24/2015   Procedure: ARTERIOVENOUS (AV) FISTULA CREATION-LEFT UPPER ARM;  Surgeon: Larina Earthly, MD;  Location: [facility] OR;  Service: Vascular;  Laterality: Left;   AV FISTULA PLACEMENT Left 01/15/2016   Procedure: Embolectomy of Left Arm Fistula with revision of Brachiocephalic fistula and vein patch angioplasty;  Surgeon: Larina Earthly, MD;  Location: [facility] OR;  Service: Vascular;  Laterality: Left;   AV FISTULA PLACEMENT Right 01/17/2016   Procedure: Right Arm Brachial ARTERIOVENOUS FISTULA CREATION ;  Surgeon: Larina Earthly, MD;  Location: [facility] OR;  Service: Vascular;  Laterality: Right;   CESAREAN SECTION     INSERTION OF DIALYSIS CATHETER Right 01/08/2016   Procedure: INSERTION OF DIALYSIS CATHETER L;  Surgeon: Larina Earthly, MD;  Location: [facility] OR;  Service: Vascular;  Laterality: Right;   RENAL BIOPSY      Short Social  History:  Social History   Tobacco Use   Smoking status: Some Days    Packs/day: .25    Types: Cigarettes    Last attempt to quit: 12/23/2015    Years since quitting: 7.1   Smokeless tobacco: Never   Tobacco comments:    2-3 cigs  Substance Use Topics   Alcohol use: No    Alcohol/week: 0.0 standard drinks of alcohol    Allergies  Allergen Reactions   Lisinopril Cough    Current Outpatient Medications  Medication Sig Dispense Refill   cyclobenzaprine (FLEXERIL) 10 MG tablet Take 1 tablet (10 mg total) by mouth in the morning and at bedtime. Do not drive while on this medication 60 tablet 11   midodrine (PROAMATINE) 10 MG tablet Take 10 mg by mouth 3 (three) times a week. Take 1 tablet by mouth before dialysis on Tue, Thurs, Sat     VELTASSA 8.4 g packet Take 1 packet by mouth daily as needed (for hyperkalemia).     oxyCODONE-acetaminophen (PERCOCET) 10-325 MG tablet Take 1 tablet by mouth 3 (three) times daily as needed for pain. Take 1 tablet by mouth 3 (three) times daily as needed for pain. 30 day supply 75 tablet 0   No current facility-administered medications for this visit.    Review of Systems  Constitutional:  Constitutional negative. HENT: HENT negative.  Eyes: Eyes negative.  Respiratory:  Respiratory negative.  Cardiovascular: Cardiovascular negative.  GI: Gastrointestinal negative.  Musculoskeletal: Musculoskeletal negative.  Skin: Skin negative.  Neurological: Neurological negative. Hematologic: Hematologic/lymphatic negative.  Psychiatric: Psychiatric negative.        Objective:  Objective   Vitals:   01/29/23 1342  BP: 117/80  Pulse: 80  Resp: 20  Temp: 98 F (36.7 C)  SpO2: 98%  Weight: 200 lb (90.7 kg)  Height: 5' 6.5" (1.689 m)   Body mass index is 31.8 kg/m.  Physical Exam HENT:     Head: Normocephalic.  Eyes:     Pupils: Pupils are equal, round, and reactive to light.  Cardiovascular:     Pulses:          Radial pulses are 0 on  the left side.  Pulmonary:     Effort: Pulmonary effort is normal.  Abdominal:     General: Abdomen is flat.  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     Mental Status: She is alert.  Psychiatric:        Mood and Affect: Mood normal.     Data: Findings:  +--------------------+----------+-----------------+--------+  AVF                PSV (cm/s)Flow Vol (mL/min)Comments  +--------------------+----------+-----------------+--------+  Native artery inflow   127          1378                 +--------------------+----------+-----------------+--------+  AVF Anastomosis        274                               +--------------------+----------+-----------------+--------+     +------------+----------+------------+----------+--------------------------  ----+  OUTFLOW VEINPSV (cm/s)  Diameter  Depth (cm)           Describe                                        (cm)                                               +------------+----------+------------+----------+--------------------------  ----+  Shoulder      169        0.98       1.74           Retained valve           +------------+----------+------------+----------+--------------------------  ----+  Prox UA        464        0.57       0.98       change in Diameter  and                                                             stenotic              +------------+----------+------------+----------+--------------------------  ----+  Mid UA          19        2.03       0.13        partially-occlusive         +------------+----------+------------+----------+--------------------------  ----+  Dist UA         88        1.61       0.24         change in Diameter          +------------+----------+------------+----------+--------------------------  ----+  AC Fossa       223        0.51       0.45       calcific wall  adherent                                                             thrombus              +------------+----------+------------+----------+--------------------------  ----+        Summary:  Arteriovenous fistula-Velocities less than 100cm/s noted.  Arteriovenous fistula-Stenosis noted.   Partially occlusive thrombus in the Left AVF at the Mid-Distal upper arm  level.      Assessment/Plan:    47 year old female currently on dialysis via left IJ tunneled catheter which was placed when there was concern for infection in her fistula.  Catheter working well fistula does not appear overtly infected she denies any fevers.  There does appear to be stenosis with possible partially occlusive thrombus in the upper part of her fistula which is consistent with the pulsatility on physical exam.  Plan will be for fistulogram on a nondialysis day in the near future with possible intervention.  I discussed that we may not be able to fully fix the fistula and may need to talk about surgical options she demonstrates good understanding we will get her scheduled for fistulogram in the near future.    Maeola Harman MD [facility]

## 2023-01-29 NOTE — Telephone Encounter (Signed)
Last rx written 12/25/22. Last OV 01/02/23. Next OV has not beeb scheduled.

## 2023-01-31 ENCOUNTER — Other Ambulatory Visit: Payer: Self-pay

## 2023-01-31 DIAGNOSIS — N186 End stage renal disease: Secondary | ICD-10-CM

## 2023-01-31 MED ORDER — SODIUM CHLORIDE 0.9 % IV SOLN
250.0000 mL | INTRAVENOUS | Status: DC | PRN
Start: 2023-01-31 — End: 2024-05-20

## 2023-01-31 MED ORDER — SODIUM CHLORIDE 0.9% FLUSH
3.0000 mL | Freq: Two times a day (BID) | INTRAVENOUS | Status: DC
Start: 2023-01-31 — End: 2024-05-20

## 2023-02-10 ENCOUNTER — Ambulatory Visit (HOSPITAL_COMMUNITY)
Admission: RE | Admit: 2023-02-10 | Discharge: 2023-02-10 | Disposition: A | Discharge status: Home / Self Care | Payer: Medicare Other | Attending: Vascular Surgery | Admitting: Vascular Surgery

## 2023-02-10 ENCOUNTER — Other Ambulatory Visit: Payer: Self-pay

## 2023-02-10 ENCOUNTER — Encounter (HOSPITAL_COMMUNITY)
Admission: RE | Disposition: A | Discharge status: Home / Self Care | Payer: Self-pay | Source: Home / Self Care | Attending: Vascular Surgery

## 2023-02-10 DIAGNOSIS — T82858A Stenosis of vascular prosthetic devices, implants and grafts, initial encounter: Secondary | ICD-10-CM | POA: Insufficient documentation

## 2023-02-10 DIAGNOSIS — Z992 Dependence on renal dialysis: Secondary | ICD-10-CM | POA: Insufficient documentation

## 2023-02-10 DIAGNOSIS — T82898A Other specified complication of vascular prosthetic devices, implants and grafts, initial encounter: Secondary | ICD-10-CM | POA: Diagnosis not present

## 2023-02-10 DIAGNOSIS — Z87891 Personal history of nicotine dependence: Secondary | ICD-10-CM | POA: Diagnosis not present

## 2023-02-10 DIAGNOSIS — Y832 Surgical operation with anastomosis, bypass or graft as the cause of abnormal reaction of the patient, or of later complication, without mention of misadventure at the time of the procedure: Secondary | ICD-10-CM | POA: Diagnosis not present

## 2023-02-10 DIAGNOSIS — I120 Hypertensive chronic kidney disease with stage 5 chronic kidney disease or end stage renal disease: Secondary | ICD-10-CM | POA: Diagnosis not present

## 2023-02-10 DIAGNOSIS — N186 End stage renal disease: Secondary | ICD-10-CM | POA: Diagnosis not present

## 2023-02-10 HISTORY — PX: A/V FISTULAGRAM: CATH118298

## 2023-02-10 HISTORY — PX: PERIPHERAL VASCULAR BALLOON ANGIOPLASTY: CATH118281

## 2023-02-10 LAB — POCT I-STAT, CHEM 8
BUN: 52 mg/dL — ABNORMAL HIGH (ref 6–20)
Calcium, Ion: 0.79 mmol/L — CL (ref 1.15–1.40)
Chloride: 101 mmol/L (ref 98–111)
Creatinine, Ser: 10.5 mg/dL — ABNORMAL HIGH (ref 0.44–1.00)
Glucose, Bld: 82 mg/dL (ref 70–99)
HCT: 42 % (ref 36.0–46.0)
Hemoglobin: 14.3 g/dL (ref 12.0–15.0)
Potassium: 5.4 mmol/L — ABNORMAL HIGH (ref 3.5–5.1)
Sodium: 131 mmol/L — ABNORMAL LOW (ref 135–145)
TCO2: 23 mmol/L (ref 22–32)

## 2023-02-10 LAB — HCG, SERUM, QUALITATIVE: Preg, Serum: NEGATIVE

## 2023-02-10 SURGERY — A/V FISTULAGRAM
Anesthesia: LOCAL | Laterality: Left

## 2023-02-10 MED ORDER — LIDOCAINE HCL (PF) 1 % IJ SOLN
INTRAMUSCULAR | Status: DC | PRN
  Administered 2023-02-10: 5 mL

## 2023-02-10 MED ORDER — HEPARIN (PORCINE) IN NACL 1000-0.9 UT/500ML-% IV SOLN
INTRAVENOUS | Status: DC | PRN
  Administered 2023-02-10: 500 mL

## 2023-02-10 MED ORDER — FENTANYL CITRATE (PF) 100 MCG/2ML IJ SOLN
INTRAMUSCULAR | Status: DC | PRN
  Administered 2023-02-10: 25 ug via INTRAVENOUS
  Administered 2023-02-10: 50 ug via INTRAVENOUS

## 2023-02-10 MED ORDER — LIDOCAINE HCL (PF) 1 % IJ SOLN
INTRAMUSCULAR | Status: AC
  Filled 2023-02-10: qty 30

## 2023-02-10 MED ORDER — IODIXANOL 320 MG/ML IV SOLN
INTRAVENOUS | Status: DC | PRN
  Administered 2023-02-10: 50 mL

## 2023-02-10 MED ORDER — MIDAZOLAM HCL 2 MG/2ML IJ SOLN
INTRAMUSCULAR | Status: DC | PRN
  Administered 2023-02-10: 1 mg via INTRAVENOUS

## 2023-02-10 MED ORDER — SODIUM CHLORIDE 0.9% FLUSH: 3.0000 mL | INTRAVENOUS | Status: DC | PRN

## 2023-02-10 MED ORDER — MIDAZOLAM HCL 2 MG/2ML IJ SOLN
INTRAMUSCULAR | Status: AC
  Filled 2023-02-10: qty 2

## 2023-02-10 MED ORDER — FENTANYL CITRATE (PF) 100 MCG/2ML IJ SOLN
INTRAMUSCULAR | Status: AC
  Filled 2023-02-10: qty 2

## 2023-02-10 SURGICAL SUPPLY — 19 items
BAG SNAP BAND KOVER 36X36 (MISCELLANEOUS) ×2 IMPLANT
BALLN LUTONIX AV 12X40X75 (BALLOONS) ×2
BALLN MUSTANG 12X60X75 (BALLOONS) ×2
BALLN MUSTANG 8.0X40 75 (BALLOONS) ×2
BALLOON LUTONIX AV 12X40X75 (BALLOONS) IMPLANT
BALLOON MUSTANG 12X60X75 (BALLOONS) IMPLANT
BALLOON MUSTANG 8.0X40 75 (BALLOONS) IMPLANT
CATH ANGIO 5F BER2 65CM (CATHETERS) IMPLANT
COVER DOME SNAP 22 D (MISCELLANEOUS) ×2 IMPLANT
KIT ENCORE 26 ADVANTAGE (KITS) IMPLANT
KIT MICROPUNCTURE NIT STIFF (SHEATH) IMPLANT
PROTECTION STATION PRESSURIZED (MISCELLANEOUS) ×2
SHEATH PINNACLE R/O II 7F 4CM (SHEATH) IMPLANT
SHEATH PROBE COVER 6X72 (BAG) ×2 IMPLANT
STATION PROTECTION PRESSURIZED (MISCELLANEOUS) ×2 IMPLANT
STOPCOCK MORSE 400PSI 3WAY (MISCELLANEOUS) ×2 IMPLANT
TRAY PV CATH (CUSTOM PROCEDURE TRAY) ×2 IMPLANT
TUBING CIL FLEX 10 FLL-RA (TUBING) ×2 IMPLANT
WIRE STARTER BENTSON 035X150 (WIRE) IMPLANT

## 2023-02-10 NOTE — Interval H&P Note (Signed)
History and Physical Interval Note:  02/10/2023 1:54 PM  Sandy [name]  has presented today for surgery, with the diagnosis of End stage renal disease.  The various methods of treatment have been discussed with the patient and family. After consideration of risks, benefits and other options for treatment, the patient has consented to  Procedure(s): A/V Fistulagram (Left) as a surgical intervention.  The patient's history has been reviewed, patient examined, no change in status, stable for surgery.  I have reviewed the patient's chart and labs.  Questions were answered to the patient's satisfaction.     [name]

## 2023-02-10 NOTE — Op Note (Signed)
    Patient name: Sandy [name] MRN: [id number] DOB: [birth year] Sex: female  02/10/2023 Pre-operative Diagnosis: End-stage renal disease to left arm AV fistula Post-operative diagnosis:  Same Surgeon:  Luanna Salk. Randie Heinz, MD Procedure Performed: 1.  Ultrasound-guided cannulation left arm AV fistula 2.  Left upper extremity shuntogram 3.  Drug-coated balloon angioplasty left basilic vein fistula with 12 x 40 mm Lutonix 4.  Moderate sedation with fentanyl and Versed for 5 minutes  Indications: 47 year old female with history of end-stage renal disease has a previous history of left arm basilic vein fistula which was created in [place].  This has been painful and pulsatile and has not been used she is now dialyzing via catheter and is indicated for fistulogram with possible intervention.  Findings: Fistula was very large was actually calcified proximally near its anastomosis with the brachial artery.  Towards the upper arm where the basilic vein appeared to enter the deep system there was 90% stenosis reduced to 0% stenosis after drug-coated balloon angioplasty.  There was a strong thrill in the fistula at completion which previously was pulsatile.   Procedure:  The patient was identified in the holding area and taken to room 8.  The patient was then placed supine on the table and prepped and draped in the usual sterile fashion.  A time out was called.  Ultrasound was used to evaluate the left AV fistula.  Patient was evaluated with ultrasound and an area near the the most distal pseudoaneurysm was anesthetized with 1% lidocaine and cannulated micropuncture needle followed by wire and a micropuncture sheath.  Left upper extremity shuntogram was performed including central venogram.  We then placed a Bentson wire followed by a 7 French sheath.  A Kumpe catheter was used to direct Bentson wire past the stenosed area in the fistula.  We then dilated with 8 mm followed by 12 mm balloons and then  drug-coated balloon at nominal pressure and this did rupture soon after inflation.  Completion demonstrated no residual stenosis.  We also performed retrograde angiography which demonstrated patency of the fistula proximally as well as the arteriovenous anastomosis.  Prior to dilating up final blood we did mention moderate sedation with fentanyl and Versed given the pain that the patient was experiencing.  Her vital signs were monitored throughout the case by bedside nursing.  After completion angiography we then removed the cath and wire and then the sheath cannulation site was suture-ligated with 4 Monocryl.  The patient tolerated procedure without immediate complication.  Contrast: 50cc    Sandy [name] C. Randie Heinz, MD [facility] Office: [phone] Pager: [phone]

## 2023-02-11 ENCOUNTER — Encounter (HOSPITAL_COMMUNITY): Payer: Self-pay | Admitting: Vascular Surgery

## 2023-02-27 ENCOUNTER — Other Ambulatory Visit: Payer: Self-pay | Admitting: Student

## 2023-02-27 DIAGNOSIS — F1190 Opioid use, unspecified, uncomplicated: Secondary | ICD-10-CM

## 2023-02-27 MED ORDER — OXYCODONE-ACETAMINOPHEN 10-325 MG PO TABS
1.0000 | ORAL_TABLET | Freq: Three times a day (TID) | ORAL | 0 refills | Status: DC | PRN
Start: 2023-02-27 — End: 2023-03-26

## 2023-03-25 ENCOUNTER — Other Ambulatory Visit: Payer: Self-pay | Admitting: Student

## 2023-03-25 DIAGNOSIS — F1190 Opioid use, unspecified, uncomplicated: Secondary | ICD-10-CM

## 2023-03-26 ENCOUNTER — Other Ambulatory Visit: Payer: Self-pay | Admitting: Student

## 2023-03-26 DIAGNOSIS — F1190 Opioid use, unspecified, uncomplicated: Secondary | ICD-10-CM

## 2023-03-26 MED ORDER — OXYCODONE-ACETAMINOPHEN 10-325 MG PO TABS
1.0000 | ORAL_TABLET | Freq: Three times a day (TID) | ORAL | 0 refills | Status: DC | PRN
Start: 2023-03-26 — End: 2023-04-28

## 2023-04-28 ENCOUNTER — Other Ambulatory Visit: Payer: Self-pay | Admitting: Student

## 2023-04-28 DIAGNOSIS — F1190 Opioid use, unspecified, uncomplicated: Secondary | ICD-10-CM

## 2023-04-28 MED ORDER — OXYCODONE-ACETAMINOPHEN 10-325 MG PO TABS: 1.0000 | ORAL_TABLET | Freq: Three times a day (TID) | ORAL | 0 refills | Status: DC | PRN

## 2023-04-28 NOTE — Telephone Encounter (Signed)
Last rx written - 03/26/23. Last OV - 01/02/23. Next OV - no appt has been scheduled. No TOX.

## 2023-05-01 NOTE — Telephone Encounter (Signed)
Patient sent message via my chart to contact our office to schedule future appointment.

## 2023-05-27 ENCOUNTER — Other Ambulatory Visit: Payer: Self-pay | Admitting: Student

## 2023-05-27 DIAGNOSIS — F1190 Opioid use, unspecified, uncomplicated: Secondary | ICD-10-CM

## 2023-05-29 ENCOUNTER — Other Ambulatory Visit: Payer: Self-pay | Admitting: Student

## 2023-05-29 DIAGNOSIS — F1190 Opioid use, unspecified, uncomplicated: Secondary | ICD-10-CM

## 2023-05-29 MED ORDER — OXYCODONE-ACETAMINOPHEN 10-325 MG PO TABS
1.0000 | ORAL_TABLET | Freq: Three times a day (TID) | ORAL | 0 refills | Status: DC | PRN
Start: 2023-05-29 — End: 2023-06-26

## 2023-05-29 NOTE — Progress Notes (Signed)
PDMP with appropriate refills for oxycodone-acetaminophen 10-325 mg tablets, up to TID PRN . Patient will be seen at [facility] on 9/10. Last refill on 8/6 for 75 tablets. Will refill today; spoke with patient and she is aware.

## 2023-06-02 NOTE — Progress Notes (Unsigned)
   CC: follow up on weight loss  HPI:  [name] is a 47 y.o. with medical history of HTN, ESRD on HD, Class I Obesity presenting to [facility] for follow up on weight loss.   Please see problem-based list for further details, assessments, and plans.  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 Take 1 tablet by mouth before dialysis on Tue, Thurs, Sat     Current Outpatient Medications (Analgesics):    oxyCODONE-acetaminophen (PERCOCET) 10-325 MG tablet, Take 1 tablet by mouth 3 (three) times daily as needed for pain. Take 1 tablet by mouth 3 (three) times daily as needed for pain. 30 day supply     Current Outpatient Medications (Other):    cyclobenzaprine (FLEXERIL) 10 MG tablet, Take 1 tablet (10 mg total) by mouth in the morning and at bedtime. Do not drive while on this medication   VELTASSA 8.4 g packet, Take 1 packet by mouth daily as needed (for hyperkalemia).  Current Facility-Administered Medications (Other):    0.9 %  sodium chloride infusion   sodium chloride flush (NS) 0.9 % injection 3 mL  Review of Systems:  Review of system negative unless stated in the problem list or HPI.    Physical Exam:  There were no vitals filed for this visit. Physical Exam General: NAD HENT: NCAT Lungs: CTAB, no wheeze, rhonchi or rales.  Cardiovascular: Normal heart sounds, no r/m/g, 2+ pulses in all extremities. No LE edema Abdomen: No TTP, normal bowel sounds MSK: No asymmetry or muscle atrophy.  Skin: no lesions noted on exposed skin Neuro: Alert and oriented x4. CN grossly intact Psych: Normal mood and normal affect   Assessment & Plan:   No problem-specific Assessment & Plan notes  found for this encounter.   See Encounters Tab for problem based charting.  Patient Discussed with Dr. {NAMES:[id number]::"Guilloud","Hoffman","Mullen","Narendra","Vincent","Machen","Lau","Hatcher","Williams"} Gwenevere Abbot, MD Eligha Bridegroom. [facility] Internal Medicine Residency, PGY-3

## 2023-06-03 ENCOUNTER — Ambulatory Visit (INDEPENDENT_AMBULATORY_CARE_PROVIDER_SITE_OTHER): Payer: Medicare Other | Admitting: Internal Medicine

## 2023-06-03 ENCOUNTER — Encounter: Payer: Self-pay | Admitting: Internal Medicine

## 2023-06-03 VITALS — BP 100/72 | HR 100 | Temp 97.9°F | Ht 67.0 in | Wt 211.0 lb

## 2023-06-03 DIAGNOSIS — Z0000 Encounter for general adult medical examination without abnormal findings: Secondary | ICD-10-CM

## 2023-06-03 DIAGNOSIS — Z23 Encounter for immunization: Secondary | ICD-10-CM | POA: Diagnosis present

## 2023-06-03 DIAGNOSIS — L853 Xerosis cutis: Secondary | ICD-10-CM

## 2023-06-03 DIAGNOSIS — N186 End stage renal disease: Secondary | ICD-10-CM

## 2023-06-03 DIAGNOSIS — Z992 Dependence on renal dialysis: Secondary | ICD-10-CM

## 2023-06-03 DIAGNOSIS — Z1211 Encounter for screening for malignant neoplasm of colon: Secondary | ICD-10-CM

## 2023-06-03 NOTE — Patient Instructions (Addendum)
[name], it was a pleasure seeing you today! You endorsed feeling well today. Below are some of the things we talked about this visit. We look forward to seeing you in the follow up appointment!  Today we discussed: Continue using moisturizer 3 times daily. You can use aquaphor ointment.  Will follow up on your eye referral. I have referred you to stomach doctor for colonoscopy.  We will give you a flu shot today.    I have ordered the following labs today:  Lab Orders  No laboratory test(s) ordered today      Referrals ordered today:    Referral Orders         Ambulatory referral to Gastroenterology      I have ordered the following medication/changed the following medications:   Stop the following medications: There are no discontinued medications.   Start the following medications: No orders of the defined types were placed in this encounter.    Follow-up: 3 month follow up with PCP  Please make sure to arrive 15 minutes prior to your next appointment. If you arrive late, you may be asked to reschedule.   We look forward to seeing you next time. Please call our clinic at [phone] if you have any questions or concerns. The best time to call is Monday-Friday from 9am-4pm, but there is someone available 24/7. If after hours or the weekend, call the main hospital number and ask for the Internal Medicine Resident On-Call. If you need medication refills, please notify your pharmacy one week in advance and they will send [place] a request.  Thank you for letting us take part in your care. Wishing you the best!  Thank you, Gwenevere Abbot, MD

## 2023-06-05 ENCOUNTER — Encounter: Payer: Self-pay | Admitting: Internal Medicine

## 2023-06-05 DIAGNOSIS — L853 Xerosis cutis: Secondary | ICD-10-CM | POA: Insufficient documentation

## 2023-06-05 NOTE — Assessment & Plan Note (Addendum)
Flu shot given this visit. Will refer pt for colonoscopy. States she will get her pap smear with from a female provider. Pt was previously referred to opthalmology but has not heard anything. The referral was approved so will give her eye doctor's phone number so she can schedule an appointment.

## 2023-06-05 NOTE — Assessment & Plan Note (Signed)
Pt is complaint with her HD sessions and states she completes them. She takes midodrine on HD days.

## 2023-06-05 NOTE — Assessment & Plan Note (Signed)
Pt states she has dry skin that is limited to her face. She has tried different moisturizers but none of them seem to help. This has been ongoing issue for months and pt is requesting a dermatology referral. She declines any pruritus or but states her skin flakes from the dryness. This is only limited to her face. On further questioning, pt states she uses other creams including retinol cream on her face. Discussed one of the effects of retinol cream is to increase skin turnover leading to dry skin. Advised to decrease the use of the retinol cream or increase the frequency of moisturizers. Pt is only using moisturizer 1 times daily but advised this can be used more frequently. Pt denies any other symptoms on ROS such as dry eyes or mouth to suggest a different etiology of her symptoms.

## 2023-06-06 NOTE — Progress Notes (Signed)
Internal Medicine Clinic Attending  Case discussed with the resident at the time of the visit.  We reviewed the resident's history and exam and pertinent patient test results.  I agree with the assessment, diagnosis, and plan of care documented in the resident's note.

## 2023-06-09 ENCOUNTER — Encounter: Payer: Self-pay | Admitting: Internal Medicine

## 2023-06-19 ENCOUNTER — Other Ambulatory Visit: Payer: Self-pay | Admitting: *Deleted

## 2023-06-19 DIAGNOSIS — N186 End stage renal disease: Secondary | ICD-10-CM

## 2023-06-26 ENCOUNTER — Other Ambulatory Visit: Payer: Self-pay | Admitting: Student

## 2023-06-26 ENCOUNTER — Ambulatory Visit (HOSPITAL_COMMUNITY): Payer: Medicare Other

## 2023-06-26 DIAGNOSIS — F1190 Opioid use, unspecified, uncomplicated: Secondary | ICD-10-CM

## 2023-06-26 NOTE — Telephone Encounter (Signed)
Last rx written - 05/29/23. Last OV - 06/03/23. Next OV - has not been scheduled. No TOX.

## 2023-06-27 ENCOUNTER — Encounter: Payer: Self-pay | Admitting: Student

## 2023-06-27 MED ORDER — OXYCODONE-ACETAMINOPHEN 10-325 MG PO TABS
1.0000 | ORAL_TABLET | Freq: Three times a day (TID) | ORAL | 0 refills | Status: DC | PRN
Start: 2023-06-29 — End: 2023-07-29

## 2023-06-30 ENCOUNTER — Ambulatory Visit (HOSPITAL_COMMUNITY)
Admission: RE | Admit: 2023-06-30 | Discharge: 2023-06-30 | Disposition: A | Discharge status: Home / Self Care | Payer: Medicare Other | Source: Ambulatory Visit | Attending: Physician Assistant | Admitting: Physician Assistant

## 2023-06-30 DIAGNOSIS — N186 End stage renal disease: Secondary | ICD-10-CM | POA: Diagnosis present

## 2023-07-01 ENCOUNTER — Ambulatory Visit (INDEPENDENT_AMBULATORY_CARE_PROVIDER_SITE_OTHER): Payer: Medicare Other | Admitting: Physician Assistant

## 2023-07-01 VITALS — BP 110/73 | HR 56 | Temp 97.9°F | Resp 18 | Ht 67.0 in | Wt 208.8 lb

## 2023-07-01 DIAGNOSIS — N186 End stage renal disease: Secondary | ICD-10-CM

## 2023-07-01 NOTE — Progress Notes (Signed)
Office Note   History of Present Illness   Sandy [name] is a 47 y.o. (26-Jan-1976) female who presents for follow up for dialysis access.  She has a history of left brachiocephalic fistula creation by Dr. Arbie Cookey in March 2017, however this was ligated 1 month after creation due to brachial artery occlusion.  She then underwent right brachiocephalic fistula creation by Dr. Arbie Cookey.  Unfortunately this was partially thrombosed, so she underwent conversion to brachial axillary AV graft at an outside facility in Sept 2017.  Eventually in 2019 she underwent left basilic vein fistula creation in [place].  Dr. Randie Heinz performed a LUE fistulogram in May 2024 with DCBA of the fistula for pulsatility and difficulty with cannulation.  She returns today for repeat evaluation.  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  This aneurysm has been present for several months and is stable in size.  There is no overlying ulceration.  She states dialysis does not have a difficult time sticking her fistula.  She has no prolonged bleeding, infiltration events, or difficulty with finishing dialysis sessions.  Dialysis is able to stick the fistula at the aneurysm site and distal to it.  Occasionally some clots will get pulled, but this overall does not affect her dialysis.  Current Outpatient Medications  Medication Sig Dispense Refill   cyclobenzaprine (FLEXERIL) 10 MG tablet Take 1 tablet (10 mg total) by mouth in the morning and at bedtime. Do not drive while on this medication 60 tablet 11   midodrine (PROAMATINE) 10 MG tablet Take 10 mg by mouth 3 (three) times a week. Take 1 tablet by mouth before dialysis on Tue, Thurs, Sat     oxyCODONE-acetaminophen (PERCOCET) 10-325 MG tablet Take 1 tablet by mouth 3 (three) times daily as needed for pain. Take 1 tablet by mouth 3 (three) times daily as needed for pain. 30 day supply 75 tablet 0   VELTASSA  8.4 g packet Take 1 packet by mouth daily as needed (for hyperkalemia).     Current Facility-Administered Medications  Medication Dose Route Frequency Provider Last Rate Last Admin   0.9 %  sodium chloride infusion  250 mL Intravenous PRN Maeola Harman, MD       sodium chloride flush (NS) 0.9 % injection 3 mL  3 mL Intravenous Q12H Maeola Harman, MD        REVIEW OF SYSTEMS (negative unless checked):   Cardiac:  []  Chest pain or chest pressure? []  Shortness of breath upon activity? []  Shortness of breath when lying flat? []  Irregular heart rhythm?  Vascular:  []  Pain in calf, thigh, or hip brought on by walking? []  Pain in feet at night that wakes you up from your sleep? []  Blood clot in your veins? []  Leg swelling?  Pulmonary:  []  Oxygen at home? []  Productive cough? []  Wheezing?  Neurologic:  []  Sudden weakness in arms or legs? []  Sudden numbness in arms or legs? []  Sudden onset of difficult speaking or slurred speech? []  Temporary loss of vision in one eye? []  Problems with dizziness?  Gastrointestinal:  []  Blood in stool? []  Vomited blood?  Genitourinary:  []  Burning when urinating? []  Blood in urine?  Psychiatric:  []  Major depression  Hematologic:  []  Bleeding problems? []  Problems with blood clotting?  Dermatologic:  []  Rashes or ulcers?  Constitutional:  []  Fever or chills?  Ear/Nose/Throat:  []  Change in hearing? []   Nose bleeds? []  Sore throat?  Musculoskeletal:  []  Back pain? []  Joint pain? []  Muscle pain?   Physical Examination   Vitals:   07/01/23 0836  BP: 110/73  Pulse: (!) 56  Resp: 18  Temp: 97.9 F (36.6 C)  TempSrc: Temporal  SpO2: (!) 88%  Weight: 208 lb 12.8 oz (94.7 kg)  Height: 5\' 7"  (1.702 m)   Body mass index is 32.7 kg/m.  General:  WDWN in NAD; vital signs documented above Gait: Not observed HENT: WNL, normocephalic Pulmonary: normal non-labored breathing , without rales, rhonchi,   wheezing Cardiac: Regular Abdomen: soft, NT, no masses Skin: without rashes Vascular Exam/Pulses: Palpable left radial pulse Extremities: Left brachiobasilic fistula with good thrill on palpation and great bruit and auscultation.  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  Skin can be easily mobilized over the aneurysm. Musculoskeletal: no muscle wasting or atrophy  Neurologic: A&O X 3;  No focal weakness or paresthesias are detected Psychiatric:  The pt has Normal affect.   Non-invasive Vascular Imaging   Left Arm Access Duplex  (06/30/2023):  +--------------------+----------+-----------------+--------+  AVF                PSV (cm/s)Flow Vol (mL/min)Comments  +--------------------+----------+-----------------+--------+  Native artery inflow    89                               +--------------------+----------+-----------------+--------+  AVF Anastomosis        573                               +--------------------+----------+-----------------+--------+     +------------+----------+-------------+----------+-------------------+  OUTFLOW VEINPSV (cm/s)Diameter (cm)Depth (cm)     Describe        +------------+----------+-------------+----------+-------------------+  Prox UA        102        1.10        0.51                        +------------+----------+-------------+----------+-------------------+  Mid UA          51        1.64        0.18                        +------------+----------+-------------+----------+-------------------+  Dist UA         61        2.83        0.13   partially-occlusive  +------------+----------+-------------+----------+-------------------+  AC Fossa       131        0.95        0.50                        +------------+----------+-------------+----------+-------------------+    Medical Decision Making   Sandy [name] is a 47 y.o. female who presents for evaluation of  dialysis access  The patient has a history of several previous dialysis accesses.  She currently dialyzes via left brachiobasilic fistula, which is created in [place] about 5 years ago.  Most recently in May she underwent DCBA of the fistula for high-grade stenosis She presents to our office for reevaluation of an aneurysmal area of her fistula with partially occlusive mural thrombus.  Duplex confirms there is partially occlusive  thrombus in the distal portion of the fistula, within the aneurysm.  On exam her left upper arm fistula has a good good thrill on palpation and good bruit on auscultation.  There is an aneurysmal area within the mid upper arm, with no overlying ulcerations.  Dialysis is able to stick her fistula both at the aneurysmal site and distal to it.  She has had no infiltration events, difficulty with cannulation, prolonged bleeding, or inability to finish dialysis sessions. At this time given that her aneurysm has no overlying ulcerations and she has no issues with dialysis, we would not pursue intervention on this fistula.  I explained to the patient should she have issues in the future with dialysis, we can reevaluate her. She can follow-up with our office as needed   Loel Dubonnet PA-C [facility] Office: [phone]  Clinic MD: Steve Rattler

## 2023-07-02 ENCOUNTER — Ambulatory Visit (AMBULATORY_SURGERY_CENTER): Payer: Medicare Other

## 2023-07-02 VITALS — Ht 66.0 in | Wt 200.0 lb

## 2023-07-02 DIAGNOSIS — Z1211 Encounter for screening for malignant neoplasm of colon: Secondary | ICD-10-CM

## 2023-07-02 NOTE — Progress Notes (Signed)

## 2023-07-16 ENCOUNTER — Telehealth: Payer: Self-pay | Admitting: *Deleted

## 2023-07-16 NOTE — Telephone Encounter (Signed)
Thanks for catching this Sandy [name].  [name], please notify the patient of the change in plans.  She will need to be scheduled at the hospital this will need to be coordinated with her dialysis schedule.  Let me know if there are issues.

## 2023-07-16 NOTE — Telephone Encounter (Signed)
Dr. Leone Payor,  This pt is scheduled with you on 10/29. She has end stage renal disease and is on hemodialysis; her procedure will need to be done at the hospital.  Best regards,  Cyndee Brightly CRNA, MS

## 2023-07-17 ENCOUNTER — Other Ambulatory Visit: Payer: Self-pay

## 2023-07-17 DIAGNOSIS — Z1211 Encounter for screening for malignant neoplasm of colon: Secondary | ICD-10-CM

## 2023-07-17 NOTE — Telephone Encounter (Signed)
Scheduled colon at [facility] with patient for 08/07/23 at 11:15 am. New amb ref & hospital orders placed. Updated instructions sent to patient. Pt advised to call back with any further questions. Pt verbalized all understanding.

## 2023-07-17 NOTE — Telephone Encounter (Signed)
Cancelled procedure in the [facility] & made patient aware. Dialysis days are MWF. She's aware we will contact her again with new date for hospital procedure.

## 2023-07-22 ENCOUNTER — Encounter: Payer: Medicare Other | Admitting: Internal Medicine

## 2023-07-23 ENCOUNTER — Other Ambulatory Visit (HOSPITAL_COMMUNITY): Payer: Self-pay | Admitting: Nephrology

## 2023-07-23 DIAGNOSIS — R140 Abdominal distension (gaseous): Secondary | ICD-10-CM

## 2023-07-27 ENCOUNTER — Other Ambulatory Visit: Payer: Self-pay | Admitting: Student

## 2023-07-27 DIAGNOSIS — F1190 Opioid use, unspecified, uncomplicated: Secondary | ICD-10-CM

## 2023-07-29 ENCOUNTER — Other Ambulatory Visit: Payer: Self-pay | Admitting: Student

## 2023-07-29 ENCOUNTER — Other Ambulatory Visit: Payer: Self-pay | Admitting: *Deleted

## 2023-07-29 ENCOUNTER — Encounter: Payer: Self-pay | Admitting: Student

## 2023-07-29 DIAGNOSIS — F1190 Opioid use, unspecified, uncomplicated: Secondary | ICD-10-CM

## 2023-07-29 MED ORDER — OXYCODONE-ACETAMINOPHEN 10-325 MG PO TABS
1.0000 | ORAL_TABLET | Freq: Three times a day (TID) | ORAL | 0 refills | Status: DC | PRN
Start: 2023-07-30 — End: 2023-08-27

## 2023-07-29 NOTE — Telephone Encounter (Signed)
Last rx written-06/29/23. Last OV- 06/03/23. Next OV- has not been scheduled. No TOX

## 2023-07-31 ENCOUNTER — Encounter (HOSPITAL_COMMUNITY): Payer: Self-pay | Admitting: Internal Medicine

## 2023-08-03 ENCOUNTER — Encounter: Payer: Self-pay | Admitting: Student

## 2023-08-05 ENCOUNTER — Telehealth: Payer: Self-pay | Admitting: Internal Medicine

## 2023-08-05 NOTE — Telephone Encounter (Signed)
Inbound call from patient wishing to reschedule 11/14 colonoscopy. Patient is requesting a call back. Please advise, thank you.

## 2023-08-06 ENCOUNTER — Encounter (HOSPITAL_COMMUNITY): Payer: Self-pay | Admitting: Certified Registered"

## 2023-08-07 ENCOUNTER — Ambulatory Visit (HOSPITAL_COMMUNITY): Admission: RE | Admit: 2023-08-07 | Payer: Medicare Other | Source: Home / Self Care | Admitting: Internal Medicine

## 2023-08-07 SURGERY — COLONOSCOPY WITH PROPOFOL
Anesthesia: Monitor Anesthesia Care

## 2023-08-07 NOTE — Telephone Encounter (Signed)
Left message for patient to call back  

## 2023-08-08 NOTE — Telephone Encounter (Signed)
Left message for patient to call back  

## 2023-08-11 NOTE — Telephone Encounter (Signed)
Left message for pt to call back  °

## 2023-08-12 NOTE — Telephone Encounter (Signed)
Letter sent to patient.

## 2023-08-27 ENCOUNTER — Other Ambulatory Visit: Payer: Self-pay | Admitting: Student

## 2023-08-27 DIAGNOSIS — F1190 Opioid use, unspecified, uncomplicated: Secondary | ICD-10-CM

## 2023-08-27 MED ORDER — OXYCODONE-ACETAMINOPHEN 10-325 MG PO TABS: 1.0000 | ORAL_TABLET | Freq: Three times a day (TID) | ORAL | 0 refills | Status: DC | PRN

## 2023-08-27 NOTE — Telephone Encounter (Signed)
Last rx written - 07/30/23. Last OV - 9/10 with Dr Welton Flakes. Next OV - no appt scheduled.

## 2023-08-28 ENCOUNTER — Other Ambulatory Visit: Payer: Self-pay | Admitting: Student

## 2023-08-28 DIAGNOSIS — F1190 Opioid use, unspecified, uncomplicated: Secondary | ICD-10-CM

## 2023-08-28 NOTE — Telephone Encounter (Signed)
Oxycodone was refilled yesterday; due to not having authorization, I'm unable to refuse this refill request. Sending to the doctor to refuse if appropriate.

## 2023-09-09 ENCOUNTER — Other Ambulatory Visit: Payer: Self-pay | Admitting: Student

## 2023-09-19 ENCOUNTER — Ambulatory Visit (HOSPITAL_COMMUNITY)
Admission: AD | Admit: 2023-09-19 | Discharge: 2023-09-19 | Disposition: A | Discharge status: Home / Self Care | Payer: Medicare Other | Source: Ambulatory Visit | Attending: Nephrology | Admitting: Nephrology

## 2023-09-19 ENCOUNTER — Other Ambulatory Visit: Payer: Self-pay

## 2023-09-19 ENCOUNTER — Encounter (HOSPITAL_COMMUNITY)
Admission: AD | Disposition: A | Discharge status: Home / Self Care | Payer: Self-pay | Source: Ambulatory Visit | Attending: Nephrology

## 2023-09-19 DIAGNOSIS — Z992 Dependence on renal dialysis: Secondary | ICD-10-CM | POA: Insufficient documentation

## 2023-09-19 DIAGNOSIS — G8929 Other chronic pain: Secondary | ICD-10-CM | POA: Diagnosis not present

## 2023-09-19 DIAGNOSIS — I120 Hypertensive chronic kidney disease with stage 5 chronic kidney disease or end stage renal disease: Secondary | ICD-10-CM | POA: Diagnosis not present

## 2023-09-19 DIAGNOSIS — D649 Anemia, unspecified: Secondary | ICD-10-CM | POA: Insufficient documentation

## 2023-09-19 DIAGNOSIS — E875 Hyperkalemia: Secondary | ICD-10-CM | POA: Insufficient documentation

## 2023-09-19 DIAGNOSIS — F17200 Nicotine dependence, unspecified, uncomplicated: Secondary | ICD-10-CM | POA: Insufficient documentation

## 2023-09-19 DIAGNOSIS — Z79891 Long term (current) use of opiate analgesic: Secondary | ICD-10-CM | POA: Insufficient documentation

## 2023-09-19 DIAGNOSIS — N186 End stage renal disease: Secondary | ICD-10-CM | POA: Insufficient documentation

## 2023-09-19 DIAGNOSIS — M79629 Pain in unspecified upper arm: Secondary | ICD-10-CM | POA: Diagnosis not present

## 2023-09-19 DIAGNOSIS — N250 Renal osteodystrophy: Secondary | ICD-10-CM | POA: Insufficient documentation

## 2023-09-19 DIAGNOSIS — T82868A Thrombosis of vascular prosthetic devices, implants and grafts, initial encounter: Secondary | ICD-10-CM | POA: Insufficient documentation

## 2023-09-19 HISTORY — PX: DIALYSIS/PERMA CATHETER INSERTION: CATH118288

## 2023-09-19 LAB — BASIC METABOLIC PANEL
Anion gap: 24 — ABNORMAL HIGH (ref 5–15)
BUN: 77 mg/dL — ABNORMAL HIGH (ref 6–20)
CO2: 16 mmol/L — ABNORMAL LOW (ref 22–32)
Calcium: 8.7 mg/dL — ABNORMAL LOW (ref 8.9–10.3)
Chloride: 99 mmol/L (ref 98–111)
Creatinine, Ser: 14.85 mg/dL — ABNORMAL HIGH (ref 0.44–1.00)
GFR, Estimated: 3 mL/min — ABNORMAL LOW (ref >60)
Glucose, Bld: 55 mg/dL — ABNORMAL LOW (ref 70–99)
Potassium: >7.5 mmol/L (ref 3.5–5.1)
Sodium: 139 mmol/L (ref 135–145)

## 2023-09-19 LAB — POCT I-STAT, CHEM 8
BUN: 90 mg/dL — ABNORMAL HIGH (ref 6–20)
Calcium, Ion: 1.08 mmol/L — ABNORMAL LOW (ref 1.15–1.40)
Chloride: 101 mmol/L (ref 98–111)
Creatinine, Ser: 14.9 mg/dL — ABNORMAL HIGH (ref 0.44–1.00)
Glucose, Bld: 65 mg/dL — ABNORMAL LOW (ref 70–99)
HCT: 39 % (ref 36.0–46.0)
Hemoglobin: 13.3 g/dL (ref 12.0–15.0)
Potassium: 7.6 mmol/L (ref 3.5–5.1)
Sodium: 137 mmol/L (ref 135–145)
TCO2: 25 mmol/L (ref 22–32)

## 2023-09-19 LAB — TROPONIN I (HIGH SENSITIVITY): Troponin I (High Sensitivity): 7 ng/L (ref <18)

## 2023-09-19 SURGERY — DIALYSIS/PERMA CATHETER INSERTION
Anesthesia: LOCAL

## 2023-09-19 MED ORDER — IODIXANOL 320 MG/ML IV SOLN
INTRAVENOUS | Status: DC | PRN
  Administered 2023-09-19: 3 mL

## 2023-09-19 MED ORDER — HEPARIN SODIUM (PORCINE) 1000 UNIT/ML IJ SOLN
INTRAMUSCULAR | Status: DC | PRN
  Administered 2023-09-19 (×2): 2100 [IU] via INTRAVENOUS

## 2023-09-19 MED ORDER — HEPARIN (PORCINE) IN NACL 1000-0.9 UT/500ML-% IV SOLN
INTRAVENOUS | Status: DC | PRN
  Administered 2023-09-19: 500 mL

## 2023-09-19 MED ORDER — LIDOCAINE HCL (PF) 1 % IJ SOLN
INTRAMUSCULAR | Status: DC | PRN
  Administered 2023-09-19: 10 mL via INTRADERMAL

## 2023-09-19 MED ORDER — FENTANYL CITRATE (PF) 100 MCG/2ML IJ SOLN
INTRAMUSCULAR | Status: AC
  Filled 2023-09-19: qty 2

## 2023-09-19 MED ORDER — CALCIUM GLUCONATE-NACL 1-0.675 GM/50ML-% IV SOLN
INTRAVENOUS | Status: AC | PRN
  Administered 2023-09-19: 1 g via INTRAVENOUS

## 2023-09-19 MED ORDER — HEPARIN SODIUM (PORCINE) 1000 UNIT/ML IJ SOLN
INTRAMUSCULAR | Status: AC
Start: 2023-09-19 — End: ?
  Filled 2023-09-19: qty 10

## 2023-09-19 MED ORDER — MIDAZOLAM HCL 2 MG/2ML IJ SOLN
INTRAMUSCULAR | Status: DC | PRN
  Administered 2023-09-19 (×2): 1 mg via INTRAVENOUS

## 2023-09-19 MED ORDER — MIDAZOLAM HCL 2 MG/2ML IJ SOLN
INTRAMUSCULAR | Status: AC
  Filled 2023-09-19: qty 2

## 2023-09-19 MED ORDER — CALCIUM GLUCONATE-NACL 1-0.675 GM/50ML-% IV SOLN
1.0000 g | Freq: Once | INTRAVENOUS | Status: DC
  Filled 2023-09-19: qty 50

## 2023-09-19 MED ORDER — FENTANYL CITRATE (PF) 100 MCG/2ML IJ SOLN
INTRAMUSCULAR | Status: DC | PRN
  Administered 2023-09-19: 50 ug via INTRAVENOUS
  Administered 2023-09-19 (×2): 25 ug via INTRAVENOUS

## 2023-09-19 SURGICAL SUPPLY — 8 items
CATH ANGIO 5F BER2 65CM (CATHETERS) ×1 IMPLANT
CATH PALINDROME 14.5FR 28 (CATHETERS) IMPLANT
CATH PALINDROME 14.5FR 50X33 (CATHETERS) ×1 IMPLANT
GUIDEWIRE ANGLED .035X150CM (WIRE) ×1 IMPLANT
KIT MICROPUNCTURE NIT STIFF (SHEATH) ×1 IMPLANT
SHEATH PINNACLE 7F 10CM (SHEATH) ×1 IMPLANT
TRAY PV CATH (CUSTOM PROCEDURE TRAY) ×1 IMPLANT
WIRE ROSEN-J .035X180CM (WIRE) ×1 IMPLANT

## 2023-09-19 NOTE — Progress Notes (Signed)
Patient complained of 10/10 left arm and left breast area pain.  Per patient in the left breast area pain occurs with inspiration, in the left arm pain is constant and pain is described as sharp. Dr. Juel Burrow notified, order for EKG received and completed. Peripheral IV inserted and Chem 8 completed with K+ resulting at 7.6 (green top tube for Troponin also sent to lab). Dr. Juel Burrow notified and is coming to bedside to assess patient. 2L nasal cannula applied.

## 2023-09-19 NOTE — Discharge Instructions (Signed)

## 2023-09-19 NOTE — H&P (Addendum)
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  Left-sided chest pain is more on the side/axilla and only occurred after the arm was thrombosed.  EKG is negative for any acute changes or ischemia.  Planning on catheter placement and will need to go to the emergency room, already spoke with the charge nurse and unfortunately they are absolutely overloaded and the patient will need to go to the lobby.   Hyperkalemia - 7.4 given 1 g of calcium gluconate; I also discussed with the emergency department but unfortunately they are swamped and she would have to go to the waiting area.  I also discussed with dialysis to see if is possible to dialyze from the Cath Lab but unfortunately even the emergent patients are on hold on the hospital inpatient service because of the census.  I advised the patient to go to the emergency room and even if she has to wait 3 hours, that if there was a adverse event she would be taken care of but she refused.  She stated that she had a dialysis treatment schedule at 5 in the morning.  She appears to already be on Lokelma on nondialysis days; I advised her to also take [facility] as soon as she gets home and also at 8 PM again tonight and to be careful and avoid foods that are high in potassium.  She signed out AMA. Left-sided chest pain which the patient describes is more in the upper arm/axillary region.  We actually obtain a twelve-lead EKG which was not consistent with ischemia and the patient states that her pain is worse when she lays down and also with deep inspiration.  She states that only occurred after the fistula clotted off.  Patient denies any cough or shortness of breath.  Pleuritic chest pain also concerning for pulmonary embolus and she is loaded with thrombus within the  brachiobasilic fistula including in the outflow basilic vein.  But she refused any further workup and signed out AMA. Renal osteodystrophy - continue binders per home regimen. Anemia - managed with ESA's and IV iron at dialysis center. HTN - resume home regimen. Chronic pain on opioids at home   HPI: Sandy [name] is an 47 y.o. female with a history of hypertension, parathyroidectomy, end-stage renal disease secondary FSGS with a left upper arm brachiobasilic transposed fistula placed in [place].  We have had to treat the inflow with a 7 mm balloon angioplasty last in February 2024.  Patient is being referred for a thrombosed left arm fistula; she is complaining of pain along the left axillary and upper arm area.  On ultrasound examination thrombus extends all the way into the axillary vein appears to be clear beyond that.  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  ROS Per HPI.  Chemistry and CBC: Creatinine, Ser  Date/Time Value Ref Range Status  09/19/2023 01:01 PM 14.90 (H) 0.44 - 1.00 mg/dL Final  29/56/2130 86:57 PM 10.50 (H) 0.44 - 1.00 mg/dL Final  84/69/6295 28:41 PM 8.25 (H) 0.44 - 1.00 mg/dL Final  32/44/0102 72:53 AM 10.86 (H) 0.44 - 1.00 mg/dL Final  66/44/0347 42:59 AM 7.82 (H) 0.44 - 1.00 mg/dL Final  56/38/7564 33:29 AM 9.21 (H) 0.44 - 1.00 mg/dL Final  51/88/4166 06:30 AM 14.50 (H) 0.44 - 1.00 mg/dL Final  08/04/2022 11:15 PM 14.18 (H) 0.44 - 1.00 mg/dL Final  16/06/9603 54:09 PM 13.78 (H) 0.44 - 1.00 mg/dL Final  81/19/1478 29:56 AM 6.71 (H) 0.44 - 1.00 mg/dL Final  21/30/8657 84:69 AM 5.85 (H) 0.44 - 1.00 mg/dL Final  62/95/2841 32:44 AM 4.55 (H) 0.44 - 1.00 mg/dL Final  09/25/7251 66:44 AM 5.74 (H) 0.44 - 1.00 mg/dL Final    Comment:    DELTA CHECK NOTED  01/18/2016 04:55 AM 3.72 (H) 0.44 - 1.00 mg/dL Final    Comment:    DELTA CHECK NOTED  01/17/2016 09:54 AM 5.60 (H) 0.44 - 1.00 mg/dL Final  03/47/4259 56:38 AM 5.60  (H) 0.44 - 1.00 mg/dL Final  75/64/3329 51:88 AM 5.21 (H) 0.44 - 1.00 mg/dL Final  41/66/0630 16:01 AM 5.30 (H) 0.44 - 1.00 mg/dL Final  09/32/3557 32:20 AM 3.55 (H) 0.44 - 1.00 mg/dL Final  25/42/7062 37:62 AM 5.13 (H) 0.44 - 1.00 mg/dL Final  83/15/1761 60:73 AM 4.77 (H) 0.44 - 1.00 mg/dL Final  71/02/2693 85:46 AM 3.80 (H) 0.44 - 1.00 mg/dL Final  27/11/5007 38:18 AM 4.21 (H) 0.44 - 1.00 mg/dL Final  29/93/7169 67:89 AM 3.42 (H) 0.44 - 1.00 mg/dL Final  38/06/1750 02:58 AM 4.14 (H) 0.44 - 1.00 mg/dL Final  52/77/8242 35:36 AM 3.75 (H) 0.44 - 1.00 mg/dL Final  14/43/1540 08:67 AM 3.63 (H) 0.44 - 1.00 mg/dL Final  61/95/0932 67:12 AM 3.38 (H) 0.44 - 1.00 mg/dL Final  45/80/9983 38:25 AM 3.26 (H) 0.44 - 1.00 mg/dL Final  05/39/7673 41:93 PM 3.13 (H) 0.44 - 1.00 mg/dL Final  79/10/4095 35:32 PM 3.07 (H) 0.44 - 1.00 mg/dL Final  99/24/2683 41:96 AM 3.19 (H) 0.44 - 1.00 mg/dL Final  22/29/7989 21:19 AM 3.33 (H) 0.44 - 1.00 mg/dL Final  41/74/0814 48:18 AM 3.40 (H) 0.44 - 1.00 mg/dL Final  56/31/4970 26:37 AM 3.51 (H) 0.44 - 1.00 mg/dL Final  85/88/5027 74:12 AM 3.37 (H) 0.44 - 1.00 mg/dL Final  87/86/7672 09:47 AM 3.46 (H) 0.44 - 1.00 mg/dL Final  09/62/8366 29:47 AM 3.43 (H) 0.44 - 1.00 mg/dL Final  65/46/5035 46:56 AM 3.37 (H) 0.44 - 1.00 mg/dL Final  81/27/5170 01:74 AM 2.97 (H) 0.44 - 1.00 mg/dL Final  94/49/6759 16:38 AM 3.16 (H) 0.44 - 1.00 mg/dL Final  46/65/9935 70:17 AM 3.51 (H) 0.44 - 1.00 mg/dL Final  79/39/0300 92:33 PM 3.54 (H) 0.44 - 1.00 mg/dL Final   Recent Labs  Lab 09/19/23 1301  NA 137  K 7.6*  CL 101  GLUCOSE 65*  BUN 90*  CREATININE 14.90*   Recent Labs  Lab 09/19/23 1301  HGB 13.3  HCT 39.0   Liver Function Tests: No results for input(s): "AST", "ALT", "ALKPHOS", "BILITOT", "PROT", "ALBUMIN" in the last 168 hours. No results for input(s): "LIPASE", "AMYLASE" in the last 168 hours. No results for input(s): "AMMONIA" in the last 168 hours. Cardiac  Enzymes: No results for input(s): "CKTOTAL", "CKMB", "CKMBINDEX", "TROPONINI" in the last 168 hours. Iron Studies: No results for input(s): "IRON", "TIBC", "TRANSFERRIN", "FERRITIN" in the last 72 hours. PT/INR: @LABRCNTIP (inr:5)  Xrays/Other Studies: ) Results for orders placed or performed during the hospital encounter of 09/19/23 (from the past 48 hours)  I-STAT, chem 8     Status: Abnormal   Collection Time: 09/19/23  1:01 PM  Result Value Ref Range   Sodium 137 135 - 145 mmol/L   Potassium 7.6 (HH) 3.5 - 5.1 mmol/L   Chloride 101 98 - 111 mmol/L  BUN 90 (H) 6 - 20 mg/dL   Creatinine, Ser 47.82 (H) 0.44 - 1.00 mg/dL   Glucose, Bld 65 (L) 70 - 99 mg/dL    Comment: Glucose reference range applies only to samples taken after fasting for at least 8 hours.   Calcium, Ion 1.08 (L) 1.15 - 1.40 mmol/L   TCO2 25 22 - 32 mmol/L   Hemoglobin 13.3 12.0 - 15.0 g/dL   HCT 95.6 21.3 - 08.6 %   Comment NOTIFIED PHYSICIAN    No results found.  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)    PSH:   Past Surgical History:  Procedure Laterality Date   A/V FISTULAGRAM Left 02/10/2023   Procedure: A/V Fistulagram;  Surgeon: Maeola Harman, MD;  Location: [facility] INVASIVE CV LAB;  Service: Cardiovascular;  Laterality: Left;   AV FISTULA PLACEMENT Left 11/24/2015   Procedure: ARTERIOVENOUS (AV) FISTULA CREATION-LEFT UPPER ARM;  Surgeon: Larina Earthly, MD;  Location: [facility] OR;  Service: Vascular;  Laterality: Left;   AV FISTULA PLACEMENT Left 01/15/2016   Procedure: Embolectomy of Left Arm Fistula with revision of Brachiocephalic fistula and vein patch angioplasty;  Surgeon: Larina Earthly, MD;  Location: [facility] OR;  Service: Vascular;  Laterality: Left;   AV FISTULA PLACEMENT  Right 01/17/2016   Procedure: Right Arm Brachial ARTERIOVENOUS FISTULA CREATION ;  Surgeon: Larina Earthly, MD;  Location: [facility] OR;  Service: Vascular;  Laterality: Right;   CESAREAN SECTION     INSERTION OF DIALYSIS CATHETER Right 01/08/2016   Procedure: INSERTION OF DIALYSIS CATHETER L;  Surgeon: Larina Earthly, MD;  Location: [facility] OR;  Service: Vascular;  Laterality: Right;   PERIPHERAL VASCULAR BALLOON ANGIOPLASTY  02/10/2023   Procedure: PERIPHERAL VASCULAR BALLOON ANGIOPLASTY;  Surgeon: Maeola Harman, MD;  Location: [facility] INVASIVE CV LAB;  Service: Cardiovascular;;  L AV fistula   RENAL BIOPSY      Allergies:  Allergies  Allergen Reactions   Lisinopril Cough    Medications:   Prior to Admission medications   Medication Sig Start Date End Date Taking? Authorizing Provider  cyclobenzaprine (FLEXERIL) 10 MG tablet Take 1 tablet (10 mg total) by mouth in the morning and at bedtime. Do not drive while on this medication 10/28/22 10/28/23  Adron Bene, MD  midodrine (PROAMATINE) 10 MG tablet Take 10 mg by mouth 3 (three) times a week. Take 1 tablet by mouth before dialysis on Tue, Thurs, Sat 07/09/22   [provider]  oxyCODONE-acetaminophen (PERCOCET) 10-325 MG tablet Take 1 tablet by mouth 3 (three) times daily as needed for pain. Take 1 tablet by mouth 3 (three) times daily as needed for pain. 30 day supply 08/27/23 09/26/23  Morene Crocker, MD  VELTASSA 8.4 g packet Take 1 packet by mouth daily as needed (for hyperkalemia). 08/08/22   Elease Etienne, MD    Discontinued Meds:  There are no discontinued medications.  Social History:  reports that she has been smoking cigarettes. She has never used smokeless tobacco. She reports that she does not drink alcohol and does not use drugs.  Family History:   Family History  Problem Relation Age of Onset   Diabetes Father    Colon cancer Neg Hx  Colon polyps Neg Hx    Esophageal cancer Neg Hx    Rectal cancer Neg Hx     Stomach cancer Neg Hx     Blood pressure (!) 118/91, pulse 86, resp. rate 14, weight 90.7 kg, last menstrual period 08/25/2023, SpO2 95%. GEN: NAD, A&Ox3, NCAT HEENT: No conjunctival pallor, EOMI NECK: Supple, no thyromegaly LUNGS: CTA B/L no rales, rhonchi or wheezing CV: RRR, No M/R/G ABD: SNDNT +BS  EXT: Thrombosed left upper arm brachiobasilic fistula which is aneurysmal especially a very large 1 above the juxta level in the cannulation zone with very firm thrombus noted         Ethelene Hal, MD 09/19/2023, 1:31 PM

## 2023-09-19 NOTE — Op Note (Signed)
Patient presents for a thrombectomy of a left brachiobasilic fistula that was placed in [place] with a history of inflow angioplasty in February 2024.  Unfortunately the fistula is thrombosed and that 2 large aneurysms are loaded with very well-formed and firm thrombus.  Addition her potassium is 7.4.  I educated the patient, counseled and advised placing a catheter and regrouping with referral for new access with vascular.  Ultrasound shows a good left  IJ. The decision was made to place a LIJ tunneled dialysis catheter.  Summary:  1) Successful placement of a new 28 cm cuff to tip hemodialysis catheter (Palindrome) in the left internal jugular vein with the tip in the right atrium.  Sluggish return through the venous port. 2) Recommendations to the dialysis unit TO NOT USE HEPARIN FOR THE FIRST 24 HOURS.  Description of procedure: The left neck, chest and the catheter were prepped and draped in the usual sterile fashion.  Local anesthesia was provided by injecting lidocaine 1% at the neck site overlying the desired venotomy site. Using real time ultrasound guidance, I was able to cannulate the LIJ and advance the guidewire easily into the central circulation. The wire was then manipulated and advanced into the abdominal IVC. I then blunt dissected the tissue around the 5 Fr stylet until it was freely mobile. Then, after injecting local anesthesia into the desired track and exit site I made a small incision at the exit site and tunneled a 28 cm cuff to tip Palindrome dialysis catheter, pulling it out at the venotomy site. Sequential dilation with 12, 14, and finally the peelaway sheath were done with real time fluoroscopic guidance ensuring that we were straight always lined with the wire.   It was incredibly difficult to advance the tip of the catheter past the innominate/SVC junction because of buckling over the angled glide.  Finally I had to remove the catheter and sheath, advanced a Bernstein into  the IVC and exchanged out wires for a [employer].  We did not have a stiff angled glide or Roadrunner available as we needed a stiff shaft.  I then inserted a new peel-away sheath and with better results was able to advance the catheter tip into the lower SVC/high right atrium.  I could not advance it any further.  The cuff of the catheter was positioned in the subcutaneous tunnel with the cuff approximately 2-3 cm from the exit site.   The arterial limb of the catheter aspirated and flushed with excellent flow noted, the venous limb has sluggish return for good flush. Both limbs of the catheter were locked with heparin and sterile caps placed. The hub of the catheter was sutured to the chest wall with 2-0 nylon suture.   The neck incision was closed with a vertical mattress 3-0 plain gut sutures and a purse-string 3-0 plain gut was placed at the tunnel exit site. 2-0 loose nylon sutures secured the hub to the chest wall.                         Sterile dressings were placed, and the patient returned to recovery in stable condition.  Sedation: 1+1 mg Versed, 50 + 25 mcg Fentanyl. Sedation time: 28  minutes  Contrast. 0 mL  Monitoring: Because of the patient's comorbid conditions and sedation during the procedure, continuous EKG monitoring and O2 saturation monitoring was performed throughout the procedure by the RN. There were no abnormal arrhythmias encountered.  Complications: None.  Diagnoses:  N18.6 End stage renal disease Z99.2 Dependence on renal dialysis T82.868A Thrombosis of fistula  Procedures Coding:  36558 Tunneled catheter insertion [id number] Ultrasound guidance 40981  Fluoroscopy guidance for catheter insertion Q9962 Contrast  Recommendations: Remove the suture in 3 weeks. 2.   Report any blood flow problems to [facility].  Discharge: The patient was discharged home in stable condition. The patient was given education regarding the care of the catheter and specific  instructions in case of any problems.

## 2023-09-22 ENCOUNTER — Encounter (HOSPITAL_COMMUNITY): Payer: Self-pay | Admitting: Nephrology

## 2023-09-25 ENCOUNTER — Other Ambulatory Visit: Payer: Self-pay | Admitting: Student

## 2023-09-25 DIAGNOSIS — F1190 Opioid use, unspecified, uncomplicated: Secondary | ICD-10-CM

## 2023-09-26 ENCOUNTER — Encounter: Payer: Self-pay | Admitting: Student

## 2023-09-26 DIAGNOSIS — F1190 Opioid use, unspecified, uncomplicated: Secondary | ICD-10-CM

## 2023-09-27 ENCOUNTER — Other Ambulatory Visit: Payer: Self-pay | Admitting: Student

## 2023-09-27 DIAGNOSIS — F1190 Opioid use, unspecified, uncomplicated: Secondary | ICD-10-CM

## 2023-09-29 ENCOUNTER — Encounter: Payer: Self-pay | Admitting: Student

## 2023-09-29 ENCOUNTER — Other Ambulatory Visit: Payer: Self-pay | Admitting: Student

## 2023-09-29 DIAGNOSIS — F1190 Opioid use, unspecified, uncomplicated: Secondary | ICD-10-CM

## 2023-09-29 NOTE — Telephone Encounter (Signed)
 This refill has has been requested several times; we do not have the authority to refuse Schedule II meds, the doctor has to do this.

## 2023-09-30 MED ORDER — OXYCODONE-ACETAMINOPHEN 10-325 MG PO TABS: 1.0000 | ORAL_TABLET | Freq: Three times a day (TID) | ORAL | 0 refills | Status: DC | PRN

## 2023-09-30 NOTE — Telephone Encounter (Signed)
 Oxycodone was last refilled 08/27/23.

## 2023-10-01 ENCOUNTER — Telehealth: Payer: Self-pay

## 2023-10-01 ENCOUNTER — Other Ambulatory Visit: Payer: Self-pay | Admitting: Student

## 2023-10-01 DIAGNOSIS — F1190 Opioid use, unspecified, uncomplicated: Secondary | ICD-10-CM

## 2023-10-01 MED ORDER — OXYCODONE-ACETAMINOPHEN 10-325 MG PO TABS: 1.0000 | ORAL_TABLET | Freq: Three times a day (TID) | ORAL | 0 refills | Status: DC | PRN

## 2023-10-01 NOTE — Telephone Encounter (Signed)
 Oxycodone was refilled 09/30/23.

## 2023-10-01 NOTE — Telephone Encounter (Signed)
 Duplicate request. Oxycodone was refilled 09/30/23.

## 2023-10-01 NOTE — Progress Notes (Signed)
 Patient could not get medication refilled at her usual [facility] - will resend medication to [facility] on [address]. Attempted to call the pharmacy on [address] but unable to reach their team. Left a VM to void prior prescription sent there.

## 2023-10-01 NOTE — Telephone Encounter (Signed)
 Thayer Ohm with [facility] pharmacy states they do not have  oxyCODONE-acetaminophen (PERCOCET) 10-325 MG table at this store. Requesting this Rx [facility] on [address]. They do have this medication in stock.

## 2023-10-02 ENCOUNTER — Other Ambulatory Visit (HOSPITAL_COMMUNITY): Payer: Self-pay | Admitting: Nephrology

## 2023-10-02 DIAGNOSIS — I9589 Other hypotension: Secondary | ICD-10-CM

## 2023-10-07 ENCOUNTER — Encounter (HOSPITAL_COMMUNITY): Payer: Self-pay | Admitting: Nephrology

## 2023-10-08 NOTE — Progress Notes (Signed)
Subjective:  CC: weight loss goal, chronic pain  HPI:  Sandy [name] is a 48 y.o. 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  Of note, her left upper arm fistula thrombosed leading to her having a HD cath placed 09/19/23 by Dr. Juel Burrow. Labs were significant for  K of 7.6 and BUN of 90. She declined suggestion to go to ED. since then she has been able to regularly get dialysis and labs on Wednesday with potassium less than 5.  She plans to go to [facility] for help with revision of her fistula.  She is interested in a kidney transplant long-term but has been told that she needs to lose about 15 pounds prior to this.  Please see problem based assessment and plan for additional details.  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 Do not drive while on this medication 60 tablet 11   midodrine (PROAMATINE) 10 MG tablet Take 10 mg by mouth 3 (three) times a week. Take 1 tablet by mouth before dialysis on Tue, Thurs, Sat     oxyCODONE-acetaminophen (PERCOCET) 10-325 MG tablet Take 1 tablet by mouth 3 (three) times daily as needed for pain. Take 1 tablet by mouth 3 (three) times daily as needed for pain. 30 day supply 75 tablet 0   VELTASSA 8.4 g packet Take 1 packet by mouth daily as needed (for hyperkalemia).     Current Facility-Administered Medications on File Prior to Visit  Medication Dose Route Frequency Provider Last Rate Last Admin   0.9 %  sodium chloride infusion  250 mL  Intravenous PRN Maeola Harman, MD       sodium chloride flush (NS) 0.9 % injection 3 mL  3 mL Intravenous Q12H Maeola Harman, MD        Family History  Problem Relation Age of Onset   Diabetes Father    Colon cancer Neg Hx    Colon polyps Neg Hx    Esophageal cancer Neg Hx    Rectal cancer Neg Hx    Stomach cancer Neg Hx     Social History   Socioeconomic History   Marital status: Single    Spouse name: Not on file   Number of children: Not on file   Years of education: Not on file   Highest education level: Not on file  Occupational History   Not on file  Tobacco Use   Smoking status: Some Days    Current packs/day: 0.00    Types: Cigarettes    Last attempt to quit: 12/23/2015    Years since quitting: 7.8   Smokeless tobacco: Never   Tobacco comments:    2-3 cigs  Vaping Use   Vaping status: Every Day  Substance and Sexual Activity   Alcohol use: No    Alcohol/week: 0.0 standard drinks of alcohol   Drug use: No   Sexual activity: Not on file  Other Topics  Concern   Not on file  Social History Narrative   Not on file   Social Drivers of Health   Financial Resource Strain: High Risk (09/06/2022)   Overall Financial Resource Strain (CARDIA)    Difficulty of Paying Living Expenses: Very hard  Food Insecurity: Food Insecurity Present (09/06/2022)   Hunger Vital Sign    Worried About Running Out of Food in the Last Year: Often true    Ran Out of Food in the Last Year: Often true  Transportation Needs: No Transportation Needs (09/06/2022)   PRAPARE - Administrator, Civil Service (Medical): No    Lack of Transportation (Non-Medical): No  Physical Activity: Insufficiently Active (08/29/2022)   Exercise Vital Sign    Days of Exercise per Week: 1 day    Minutes of Exercise per Session: 20 min  Stress: Stress Concern Present (08/29/2022)   [employer] of Occupational Health - Occupational Stress Questionnaire    Feeling of  Stress : Very much  Social Connections: Moderately Integrated (08/29/2022)   Social Connection and Isolation Panel [NHANES]    Frequency of Communication with Friends and Family: Twice a week    Frequency of Social Gatherings with Friends and Family: Twice a week    Attends Religious Services: 1 to 4 times per year    Active Member of [employer] or Organizations: No    Attends Banker Meetings: Never    Marital Status: Married  Catering manager Violence: Not At Risk (08/29/2022)   Humiliation, Afraid, Rape, and Kick questionnaire    Fear of Current or Ex-Partner: No    Emotionally Abused: No    Physically Abused: No    Sexually Abused: No    Review of Systems: ROS negative except for what is noted on the assessment and plan.  Objective:   Vitals:   10/09/23 1001  BP: 94/65  Pulse: (!) 103  Temp: 98.8 F (37.1 C)  TempSrc: Oral  SpO2: 99%  Weight: 206 lb 6.4 oz (93.6 kg)  Height: 5\' 6"  (1.676 m)    Physical Exam: Constitutional: well-appearing, in no acute distress Cardiovascular: regular rate and rhythm, no m/r/g Pulmonary/Chest: normal work of breathing on room air, lungs clear to auscultation bilaterally Abdominal: soft, non-tender, non-distended Extremities: Permacatheter to right upper chest covered with clean dressing Neurological: normal gait Skin: warm and dry  Assessment & Plan:  ESRD on dialysis [facility]) She has been on dialysis for the last several years.  Schedule is Monday Wednesday Friday.  Recent complication of fistula with thrombosis.  She currently has a permacath line in for dialysis.  She has to follow-up with [facility] for procedures to help with fistula.  She is hoping to have a kidney transplant eventually and has been told that she needs to lose weight for this to happen.  Obesity (BMI 30-39.9) BMI of 33.  Her goal is to lose 15 pounds to help with kidney transplant. P: Start Wegovy with titration  Chronic pain syndrome She [facility] contract  [facility].  She has been good with refills and taking medications as prescribed.  She has not had a tox assure in more than 1 year.  She does not produce urine. -Drug screen by blood  Health care maintenance She denies family history of colon cancer and is at average risk.  Cologuard ordered.  Time limitations at this visit for cervical cancer screening.  I talked with her about following up in a couple months to complete this.  Patient discussed with Dr. Precious Bard Mairin Lindsley, D.O. [facility]  PGY-3 Pager: [phone]  Phone: [phone] Date 10/09/2023  Time 2:47 PM

## 2023-10-09 ENCOUNTER — Ambulatory Visit (INDEPENDENT_AMBULATORY_CARE_PROVIDER_SITE_OTHER): Payer: Medicare Other | Admitting: Internal Medicine

## 2023-10-09 ENCOUNTER — Telehealth: Payer: Self-pay

## 2023-10-09 VITALS — BP 94/65 | HR 103 | Temp 98.8°F | Ht 66.0 in | Wt 206.4 lb

## 2023-10-09 DIAGNOSIS — N186 End stage renal disease: Secondary | ICD-10-CM

## 2023-10-09 DIAGNOSIS — Z992 Dependence on renal dialysis: Secondary | ICD-10-CM | POA: Diagnosis not present

## 2023-10-09 DIAGNOSIS — Z6833 Body mass index (BMI) 33.0-33.9, adult: Secondary | ICD-10-CM

## 2023-10-09 DIAGNOSIS — G894 Chronic pain syndrome: Secondary | ICD-10-CM

## 2023-10-09 DIAGNOSIS — E669 Obesity, unspecified: Secondary | ICD-10-CM

## 2023-10-09 DIAGNOSIS — Z1211 Encounter for screening for malignant neoplasm of colon: Secondary | ICD-10-CM

## 2023-10-09 DIAGNOSIS — Z0000 Encounter for general adult medical examination without abnormal findings: Secondary | ICD-10-CM

## 2023-10-09 MED ORDER — SEMAGLUTIDE-WEIGHT MANAGEMENT 1.7 MG/0.75ML ~~LOC~~ SOAJ: 1.7000 mg | SUBCUTANEOUS | 0 refills | Status: DC

## 2023-10-09 MED ORDER — SEMAGLUTIDE-WEIGHT MANAGEMENT 2.4 MG/0.75ML ~~LOC~~ SOAJ: 2.4000 mg | SUBCUTANEOUS | 0 refills | Status: DC

## 2023-10-09 MED ORDER — SEMAGLUTIDE-WEIGHT MANAGEMENT 0.25 MG/0.5ML ~~LOC~~ SOAJ: 0.2500 mg | SUBCUTANEOUS | 0 refills | Status: AC

## 2023-10-09 MED ORDER — SEMAGLUTIDE-WEIGHT MANAGEMENT 0.5 MG/0.5ML ~~LOC~~ SOAJ: 0.5000 mg | SUBCUTANEOUS | 0 refills | Status: DC

## 2023-10-09 MED ORDER — SEMAGLUTIDE-WEIGHT MANAGEMENT 1 MG/0.5ML ~~LOC~~ SOAJ: 1.0000 mg | SUBCUTANEOUS | 0 refills | Status: DC

## 2023-10-09 NOTE — Assessment & Plan Note (Signed)
She denies family history of colon cancer and is at average risk.  Cologuard ordered.  Time limitations at this visit for cervical cancer screening.  I talked with her about following up in a couple months to complete this.

## 2023-10-09 NOTE — Assessment & Plan Note (Signed)
She has been on dialysis for the last several years.  Schedule is Monday Wednesday Friday.  Recent complication of fistula with thrombosis.  She currently has a permacath line in for dialysis.  She has to follow-up with [facility] for procedures to help with fistula.  She is hoping to have a kidney transplant eventually and has been told that she needs to lose weight for this to happen.

## 2023-10-09 NOTE — Assessment & Plan Note (Signed)
She has pain contract with [facility].  She has been good with refills and taking medications as prescribed.  She has not had a tox assure in more than 1 year.  She does not produce urine. -Drug screen by blood

## 2023-10-09 NOTE — Telephone Encounter (Signed)
Prior Authorization for patient Sandy [name] 1.7MG /0.75ML auto-injectors) came through on cover my meds was submitted with last office notes awaiting approval or denial.  KEY:[id number]

## 2023-10-09 NOTE — Patient Instructions (Signed)
Thank you, Ms.Dannika Odell for allowing [place] to provide your care today.  Weight loss goals/transplant I have sent medication called Wegovy.  You will start on the lowest dose possible for 4 weeks.  If you are doing well on the first dosing then you will be increased to the next dose for the following 4 weeks.  If at any point you develop side effects please call and we can continue dose that you did okay on.  Pain medications We complete yearly drug screen with you being on a pain contract with [place].  I will call if there is any abnormalities.  I have sent in Cologuard testing for colon cancer screening.  Please complete this and send back to company.  Please follow-up to complete Pap smear within the next couple of months.  I have ordered the following medication/changed the following medications:   Stop the following medications: There are no discontinued medications.   Start the following medications: Meds ordered this encounter  Medications   Semaglutide-Weight Management 0.25 MG/0.5ML SOAJ    Sig: Inject 0.25 mg into the skin once a week for 28 days.    Dispense:  2 mL    Refill:  0   Semaglutide-Weight Management 0.5 MG/0.5ML SOAJ    Sig: Inject 0.5 mg into the skin once a week for 28 days.    Dispense:  2 mL    Refill:  0   Semaglutide-Weight Management 1 MG/0.5ML SOAJ    Sig: Inject 1 mg into the skin once a week for 28 days.    Dispense:  2 mL    Refill:  0   Semaglutide-Weight Management 1.7 MG/0.75ML SOAJ    Sig: Inject 1.7 mg into the skin once a week for 28 days.    Dispense:  3 mL    Refill:  0   Semaglutide-Weight Management 2.4 MG/0.75ML SOAJ    Sig: Inject 2.4 mg into the skin once a week for 28 days.    Dispense:  3 mL    Refill:  0     Follow up: 3 months   Remember: pap smear for cervical cancer screening  We look forward to seeing you next time. Please call our clinic at [phone] if you have any questions or concerns. The best time to call is  Monday-Friday from 9am-4pm, but there is someone available 24/7. If after hours or the weekend, call the main hospital number and ask for the Internal Medicine Resident On-Call. If you need medication refills, please notify your pharmacy one week in advance and they will send [place] a request.   Thank you for trusting me with your care. Wishing you the best!   Rudene Christians, DO [facility]

## 2023-10-09 NOTE — Assessment & Plan Note (Addendum)
BMI of 33.  Her goal is to lose 15 pounds to help with kidney transplant. P: Start [phone] with titration

## 2023-10-09 NOTE — Telephone Encounter (Signed)
Decision:Denied  Dear Sandy [name], We received a request from your doctor for a prior  authorization on WEGOVY 1.7 MG/0.75 ML PEN. Based  on the information received, we are unable to  approve the request to have this drug covered under  your Part D benefit.  The standard(s) to be met for this use is/are:  You must have established cardiovascular disease  (e.g.- previous myocardial infarction, previous  stroke, or symptomatic peripheral arterial disease)

## 2023-10-15 ENCOUNTER — Encounter: Payer: Medicare Other | Admitting: Student

## 2023-10-15 NOTE — Progress Notes (Signed)
 Internal Medicine Clinic Attending  Case discussed with the resident at the time of the visit.  We reviewed the resident's history and exam and pertinent patient test results.  I agree with the assessment, diagnosis, and plan of care documented in the resident's note.

## 2023-10-15 NOTE — Addendum Note (Signed)
Addended by: Dickie La on: 10/15/2023 10:51 AM   Modules accepted: Level of Service

## 2023-10-16 LAB — DRUG SCREEN 13 W/CONF, WB
Amphetamines, IA: NEGATIVE ng/mL
Barbiturates, IA: NEGATIVE ug/mL
Benzodiazepines, IA: NEGATIVE ng/mL
Cocaine/Metabolite, IA: NEGATIVE ng/mL
Fentanyl, IA: NEGATIVE ng/mL
Meperidine, IA: NEGATIVE ng/mL
Methadone, IA: NEGATIVE ng/mL
Opiates, IA: NEGATIVE ng/mL
Phencyclidine, IA: NEGATIVE ng/mL
Propoxyphene, IA: NEGATIVE ng/mL
THC (Marijuana) Mtb, IA: NEGATIVE ng/mL
Tramadol, IA: NEGATIVE ng/mL

## 2023-10-16 LAB — OXYCODONES,MS,WB/SP RFX
Oxycocone: 28.9 ng/mL
Oxycodones Confirmation: POSITIVE
Oxymorphone: NEGATIVE ng/mL

## 2023-10-17 ENCOUNTER — Encounter: Payer: Self-pay | Admitting: Internal Medicine

## 2023-10-23 ENCOUNTER — Telehealth: Payer: Self-pay | Admitting: *Deleted

## 2023-10-23 NOTE — Telephone Encounter (Signed)
Call fro [place] with [employer] requesting ICD 10 Code for ColoGuard Screening for patient.  The Code sent is not covered by [employer]. ICD 10 Code os Z12.11 is covered.  [place] will use for patient's ColoGuard Test.  Verification of mailing address was done.

## 2023-10-28 ENCOUNTER — Other Ambulatory Visit: Payer: Self-pay | Admitting: Student

## 2023-10-28 DIAGNOSIS — F1190 Opioid use, unspecified, uncomplicated: Secondary | ICD-10-CM

## 2023-10-29 ENCOUNTER — Encounter: Payer: Self-pay | Admitting: Student

## 2023-10-29 MED ORDER — OXYCODONE-ACETAMINOPHEN 10-325 MG PO TABS: 1.0000 | ORAL_TABLET | Freq: Three times a day (TID) | ORAL | 0 refills | Status: DC | PRN

## 2023-11-18 ENCOUNTER — Telehealth: Payer: Self-pay

## 2023-11-18 ENCOUNTER — Ambulatory Visit: Payer: Medicare Other

## 2023-11-18 VITALS — Ht 66.0 in | Wt 200.0 lb

## 2023-11-18 DIAGNOSIS — Z0000 Encounter for general adult medical examination without abnormal findings: Secondary | ICD-10-CM

## 2023-11-18 DIAGNOSIS — Z599 Problem related to housing and economic circumstances, unspecified: Secondary | ICD-10-CM

## 2023-11-18 DIAGNOSIS — Z5912 Inadequate housing utilities: Secondary | ICD-10-CM

## 2023-11-18 NOTE — Addendum Note (Signed)
 Addended by: Mickeal Needy on: 11/18/2023 12:41 PM   Modules accepted: Orders

## 2023-11-18 NOTE — Telephone Encounter (Signed)
 Per pharmacy wegovy 1.7mg  is not covered by the patients plan. No preferred alternative listed.

## 2023-11-18 NOTE — Patient Instructions (Addendum)
 Ms. Sneed , Thank you for taking time to come for your Medicare Wellness Visit. I appreciate your ongoing commitment to your health goals. Please review the following plan we discussed and let me know if I can assist you in the future.   Referrals/Orders/Follow-Ups/Clinician Recommendations: Yes; Keep maintaining your health by keeping your appointments with Dr. Daiva Eves and any specialists that you may see.  Call us if you need anything.  Have a great year!!!!  This is a list of the screening recommended for you and due dates:  Health Maintenance  Topic Date Due   Pneumococcal Vaccination (1 of 2 - PCV) Never done   Pap with HPV screening  Never done   Colon Cancer Screening  Never done   COVID-19 Vaccine (1 - 2024-25 season) Never done   Medicare Annual Wellness Visit  11/17/2024   DTaP/Tdap/Td vaccine (2 - Td or Tdap) 02/22/2032   Flu Shot  Completed   Hepatitis C Screening  Completed   HIV Screening  Completed   HPV Vaccine  Aged Out    Advanced directives: (Declined) Advance directive discussed with you today. Even though you declined this today, please call our office should you change your mind, and we can give you the proper paperwork for you to fill out.  Next Medicare Annual Wellness Visit scheduled for next year: NO; PATIENT HAS TO BE SCHEDULED FOR TUESDAY OR WEDNESDAY.

## 2023-11-18 NOTE — Progress Notes (Addendum)
 Subjective:   Sandy [name] is a 48 y.o. who presents for a Medicare Wellness preventive visit.  Visit Complete: Virtual I connected with  Brigid Re on 11/18/23 by a video and audio enabled telemedicine application and verified that I am speaking with the correct person using two identifiers.  Patient Location: Home  Provider Location: Office/Clinic  I discussed the limitations of evaluation and management by telemedicine. The patient expressed understanding and agreed to proceed.  Vital Signs: Because this visit was a virtual/telehealth visit, some criteria may be missing or patient reported. Any vitals not documented were not able to be obtained and vitals that have been documented are patient reported.   AWV Questionnaire: No: Patient Medicare AWV questionnaire was not completed prior to this visit.  Cardiac Risk Factors include: sedentary lifestyle;obesity (BMI >30kg/m2);smoking/ tobacco exposure     Objective:    Today's Vitals   11/18/23 1203 11/18/23 1204  Weight: 200 lb (90.7 kg)   Height: 5\' 6"  (1.676 m)   PainSc: 8  8   PainLoc: Generalized    Body mass index is 32.28 kg/m.     11/18/2023   12:05 PM 06/03/2023    3:56 PM 02/10/2023    1:41 PM 11/28/2022    9:33 AM 08/29/2022   11:09 AM 08/29/2022    9:00 AM 08/04/2022    3:21 PM  Advanced Directives  Does Patient Have a Medical Advance Directive? No No No No No No No  Would patient like information on creating a medical advance directive? No - Patient declined No - Patient declined No - Patient declined No - Patient declined No - Patient declined No - Patient declined No - Patient declined    Current Medications (verified) Outpatient Encounter Medications as of 11/18/2023  Medication Sig   midodrine (PROAMATINE) 10 MG tablet Take 10 mg by mouth 3 (three) times a week. Take 1 tablet by mouth before dialysis on Tue, Thurs, Sat   oxyCODONE-acetaminophen (PERCOCET) 10-325 MG tablet Take 1 tablet by mouth 3 (three)  times daily as needed for pain. Take 1 tablet by mouth 3 (three) times daily as needed for pain. 30 day supply   Semaglutide-Weight Management 0.5 MG/0.5ML SOAJ Inject 0.5 mg into the skin once a week for 28 days.   [START ON 12/06/2023] Semaglutide-Weight Management 1 MG/0.5ML SOAJ Inject 1 mg into the skin once a week for 28 days.   [START ON 01/04/2024] Semaglutide-Weight Management 1.7 MG/0.75ML SOAJ Inject 1.7 mg into the skin once a week for 28 days.   [START ON 02/02/2024] Semaglutide-Weight Management 2.4 MG/0.75ML SOAJ Inject 2.4 mg into the skin once a week for 28 days.   VELTASSA 8.4 g packet Take 1 packet by mouth daily as needed (for hyperkalemia).   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)   Past Surgical History:  Procedure Laterality Date   A/V FISTULAGRAM Left 02/10/2023   Procedure: A/V Fistulagram;  Surgeon: Maeola Harman, MD;  Location: [facility] INVASIVE CV LAB;  Service: Cardiovascular;  Laterality: Left;   AV  FISTULA PLACEMENT Left 11/24/2015   Procedure: ARTERIOVENOUS (AV) FISTULA CREATION-LEFT UPPER ARM;  Surgeon: Larina Earthly, MD;  [facility] OR;  Service: Vascular;  Laterality: Left;   AV FISTULA PLACEMENT Left 01/15/2016   Procedure: Embolectomy of Left Arm Fistula with revision of Brachiocephalic fistula and vein patch angioplasty;  Surgeon: Larina Earthly, MD;  Location: [facility] OR;  Service: Vascular;  Laterality: Left;   AV FISTULA PLACEMENT Right 01/17/2016   Procedure: Right Arm Brachial ARTERIOVENOUS FISTULA CREATION ;  Surgeon: Larina Earthly,  MD;  Location: [facility] OR;  Service: Vascular;  Laterality: Right;   CESAREAN SECTION     DIALYSIS/PERMA CATHETER INSERTION  09/19/2023   Procedure: DIALYSIS/PERMA CATHETER INSERTION;  Surgeon: Ethelene Hal, MD;  Location: [facility] INVASIVE CV LAB;  Service: Cardiovascular;;   INSERTION OF DIALYSIS CATHETER Right 01/08/2016   Procedure: INSERTION OF DIALYSIS CATHETER L;  Surgeon: Larina Earthly, MD;  Location: [facility] OR;  Service: Vascular;  Laterality: Right;   PERIPHERAL VASCULAR BALLOON ANGIOPLASTY  02/10/2023   Procedure: PERIPHERAL VASCULAR BALLOON ANGIOPLASTY;  Surgeon: Maeola Harman, MD;  Location: [facility] INVASIVE CV LAB;  Service: Cardiovascular;;  L AV fistula   RENAL BIOPSY     Family History  Problem Relation Age of Onset   Diabetes Father    Colon cancer Neg Hx    Colon polyps Neg Hx    Esophageal cancer Neg Hx    Rectal cancer Neg Hx    Stomach cancer Neg Hx    Social History   Socioeconomic History   Marital status: Single    Spouse name: Not on file   Number of children: Not on file   Years of education: Not on file   Highest education level: Not on file  Occupational History   Not on file  Tobacco Use   Smoking status: Some Days    Current packs/day: 0.00    Types: Cigarettes    Last attempt to quit: 12/23/2015    Years since quitting: 7.9   Smokeless tobacco: Never   Tobacco comments:    2-3 cigs  Vaping Use   Vaping status: Every Day  Substance and Sexual Activity   Alcohol use: No    Alcohol/week: 0.0 standard drinks of alcohol   Drug use: No   Sexual activity: Not on file  Other Topics Concern   Not on file  Social History Narrative   Not on file   Social Drivers of Health   Financial Resource Strain: High Risk (11/18/2023)   Overall Financial Resource Strain (CARDIA)    Difficulty of Paying Living Expenses: Very hard  Food Insecurity: Food Insecurity Present (11/18/2023)   Hunger Vital Sign    Worried About Running Out of Food in the Last Year: Often  true    Ran Out of Food in the Last Year: Often true  Transportation Needs: No Transportation Needs (11/18/2023)   PRAPARE - Administrator, Civil Service (Medical): No    Lack of Transportation (Non-Medical): No  Physical Activity: Inactive (11/18/2023)   Exercise Vital Sign    Days of Exercise per Week: 0 days    Minutes of Exercise per Session: 0 min  Stress: No Stress Concern Present (11/18/2023)   [employer] of Occupational Health - Occupational Stress Questionnaire    Feeling of Stress : Only a little  Social Connections: Moderately Integrated (11/18/2023)   Social Connection and Isolation Panel [NHANES]    Frequency of Communication with Friends and  Family: Twice a week    Frequency of Social Gatherings with Friends and Family: Twice a week    Attends Religious Services: 1 to 4 times per year    Active Member of [employer] or Organizations: No    Attends Engineer, structural: Never    Marital Status: Married    Tobacco Counseling Ready to quit: Not Answered Counseling given: Not Answered Tobacco comments: 2-3 cigs    Clinical Intake:  Pre-visit preparation completed: Yes  Pain : 0-10 Pain Score: 8  Pain Type: Acute pain, Chronic pain Pain Location: Generalized     BMI - recorded: 32.28 Nutritional Status: BMI > 30  Obese Nutritional Risks: None Diabetes: No  How often do you need to have someone help you when you read instructions, pamphlets, or other written materials from your doctor or pharmacy?: 1 - Never What is the last grade level you completed in school?: HSG; SOME COLLEGE  Interpreter Needed?: No  Information entered by :: Susie Cassette, LPN.   Activities of Daily Living     11/18/2023   12:06 PM 09/19/2023   11:31 AM  In your present state of health, do you have any difficulty performing the following activities:  Hearing? 0 0  Vision? 0 0  Difficulty concentrating or making decisions? 0 0  Walking or climbing stairs?  0   Dressing or bathing? 0   Doing errands, shopping? 0   Preparing Food and eating ? N   Using the Toilet? N   In the past six months, have you accidently leaked urine? N   Do you have problems with loss of bowel control? N   Managing your Medications? N   Managing your Finances? N   Housekeeping or managing your Housekeeping? N     Patient Care Team: Morene Crocker, MD as PCP - General Annie Sable, MD as Consulting Physician (Nephrology) Center, [facility]  Indicate any recent Medical Services you may have received from other than [facility] providers in the past year (date may be approximate).     Assessment:   This is a routine wellness examination for Sandy [name].  Hearing/Vision screen Hearing Screening - Comments:: Denies hearing difficulties.  Vision Screening - Comments:: No rx glasses - up to date with routine eye exams with Dr. Lily Kocher    Goals Addressed             This Visit's Progress    My goal is ti feel better.         Depression Screen     11/18/2023   12:07 PM 06/03/2023    3:56 PM 11/28/2022    9:33 AM 08/29/2022   11:09 AM 08/29/2022   10:12 AM  PHQ 2/9 Scores  PHQ - 2 Score 0 0 0 6 6  PHQ- 9 Score 3   13 13     Fall Risk     11/18/2023   12:06 PM 06/03/2023    3:56 PM 11/28/2022    9:33 AM 08/29/2022   11:09 AM 08/29/2022    8:59 AM  Fall Risk   Falls in the past year? 0 0 0 1 1  Number falls in past yr: 0 0 0 0 0  Injury with Fall? 0 0 0 1 1  Risk for fall due to : No Fall Risks No Fall Risks No Fall Risks Other (Comment) No Fall Risks  Risk for fall due to: Comment    fell in 2018, tripped over her shoe fell in 2018  and tripped over her shoe  Follow up Falls prevention discussed;Falls evaluation completed Falls evaluation completed Falls evaluation completed;Falls prevention discussed Falls evaluation completed;Falls prevention discussed Falls prevention discussed;Falls evaluation completed    MEDICARE RISK AT HOME:   Medicare Risk at Home Any stairs in or around the home?: No If so, are there any without handrails?: No Home free of loose throw rugs in walkways, pet beds, electrical cords, etc?: Yes Adequate lighting in your home to reduce risk of falls?: Yes Life alert?: No Use of a cane, walker or w/c?: No Grab bars in the bathroom?: Yes Shower chair or bench in shower?: No Elevated toilet seat or a handicapped toilet?: No  TIMED UP AND GO:  Was the test performed?  No  Cognitive Function: 6CIT completed    11/18/2023   12:06 PM  MMSE - Mini Mental State Exam  Not completed: Unable to complete        11/18/2023   12:06 PM 08/29/2022   11:10 AM  6CIT Screen  What Year? 0 points 0 points  What month? 0 points 0 points  What time? 0 points 0 points  Count back from 20 0 points 0 points  Months in reverse 0 points 0 points  Repeat phrase 0 points 0 points  Total Score 0 points 0 points    Immunizations Immunization History  Administered Date(s) Administered   Influenza, Seasonal, Injecte, Preservative Fre 06/03/2023   Influenza,inj,Quad PF,6+ Mos 08/29/2022   Tdap 02/21/2022    Screening Tests Health Maintenance  Topic Date Due   Pneumococcal Vaccine 23-51 Years old (1 of 2 - PCV) Never done   Cervical Cancer Screening (HPV/Pap Cotest)  Never done   Colonoscopy  Never done   COVID-19 Vaccine (1 - 2024-25 season) Never done   Medicare Annual Wellness (AWV)  11/17/2024   DTaP/Tdap/Td (2 - Td or Tdap) 02/22/2032   INFLUENZA VACCINE  Completed   Hepatitis C Screening  Completed   HIV Screening  Completed   HPV VACCINES  Aged Out    Health Maintenance  Health Maintenance Due  Topic Date Due   Pneumococcal Vaccine 18-29 Years old (1 of 2 - PCV) Never done   Cervical Cancer Screening (HPV/Pap Cotest)  Never done   Colonoscopy  Never done   COVID-19 Vaccine (1 - 2024-25 season) Never done   Health Maintenance Items Addressed: Yes See Nurse Notes  Additional  Screening:  Vision Screening: Recommended annual ophthalmology exams for early detection of glaucoma and other disorders of the eye.  Dental Screening: Recommended annual dental exams for proper oral hygiene  Community Resource Referral / Chronic Care Management: CRR required this visit?  No   CCM required this visit?  No     Plan:     I have personally reviewed and noted the following in the patient's chart:   Medical and social history Use of alcohol, tobacco or illicit drugs  Current medications and supplements including opioid prescriptions. Patient is currently taking opioid prescriptions. Information provided to patient regarding non-opioid alternatives. Patient advised to discuss non-opioid treatment plan with their provider. Functional ability and status Nutritional status Physical activity Advanced directives List of other physicians Hospitalizations, surgeries, and ER visits in previous 12 months Vitals Screenings to include cognitive, depression, and falls Referrals and appointments  In addition, I have reviewed and discussed with patient certain preventive protocols, quality metrics, and best practice recommendations. A written personalized care plan for preventive services as well as general preventive health recommendations  were provided to patient.     Mickeal Needy, LPN   1/[id number]/[id number]   After Visit Summary: (MyChart) Due to this being a telephonic visit, the after visit summary with patients personalized plan was offered to patient via MyChart   Notes: Please refer to Routing Comments.

## 2023-11-22 ENCOUNTER — Other Ambulatory Visit: Payer: Self-pay | Admitting: Student

## 2023-11-22 DIAGNOSIS — F1190 Opioid use, unspecified, uncomplicated: Secondary | ICD-10-CM

## 2023-11-24 ENCOUNTER — Other Ambulatory Visit: Payer: Self-pay | Admitting: Student

## 2023-11-24 DIAGNOSIS — F1190 Opioid use, unspecified, uncomplicated: Secondary | ICD-10-CM

## 2023-11-24 MED ORDER — OXYCODONE-ACETAMINOPHEN 10-325 MG PO TABS: 1.0000 | ORAL_TABLET | Freq: Three times a day (TID) | ORAL | 0 refills | Status: DC | PRN

## 2023-11-24 NOTE — Telephone Encounter (Signed)
 Last rx written - 10/30/23. Last OV - 10/09/23. Drug screen - 10/09/23.

## 2023-11-25 ENCOUNTER — Telehealth: Payer: Self-pay

## 2023-11-27 ENCOUNTER — Telehealth: Payer: Self-pay

## 2023-11-27 ENCOUNTER — Other Ambulatory Visit: Payer: Self-pay | Admitting: Student

## 2023-11-27 DIAGNOSIS — E669 Obesity, unspecified: Secondary | ICD-10-CM

## 2023-11-27 MED ORDER — TIRZEPATIDE-WEIGHT MANAGEMENT 2.5 MG/0.5ML ~~LOC~~ SOLN: 2.5000 mg | SUBCUTANEOUS | 1 refills | Status: DC

## 2023-11-27 MED ORDER — TIRZEPATIDE 2.5 MG/0.5ML ~~LOC~~ SOAJ: 2.5000 mg | SUBCUTANEOUS | 1 refills | Status: DC

## 2023-11-27 NOTE — Telephone Encounter (Signed)
 Prior Authorization for patient Sandy [name] 2.5MG /0.5ML auto-injectors) came through on cover my meds was submitted with last office notes awaiting approval or denial.  [id number]

## 2023-11-27 NOTE — Telephone Encounter (Signed)
 Dear Brigid Re, We're writing to share some news about your  recent prior authorization prescription request.  Unfortunately, your coverage or payment under  your Medicare Part D benefit for MOUNJARO  2.5 MG/0.5 ML PEN was denied. Here's why we made our decision: The Medicare rule in the Prescription Drug  Manual (Chapter 6, Section 20.1) says drugs  used to help you lose weight are excluded from  Medicare Part D coverage. [employer] follows  Medicare rules. The information we have about  your case says your drug is being used for  weight loss and per Medicare rules isn't  covered.

## 2023-11-28 ENCOUNTER — Telehealth: Payer: Self-pay

## 2023-11-28 NOTE — Telephone Encounter (Signed)
 Dear Sandy [name], We received a request from your doctor for a  prior authorization on ZEPBOUND 2.5 MG/0.5 ML  PEN. Based on the information received, we are  unable to approve the request to have this drug  covered under your Part D benefit. We did not find support for use, in your  situation. This drug is to be used to help lose weight.  Medicare says that drugs used for weight loss  are excluded from Part D coverage.  Medicare did release a memo on December 11, 2022. This stated that anti-obesity medications  remain excluded from Medicare Part D  coverage. But, if that same drug received  approval from the FDA for an additional  medically accepted indication, then it could be  considered a Part D drug- for that specific use  only.  This is not the case for your diagnosis.  Therefore, we are unable to approve coverage  under your Part D benefit. Please note: Zepbound is FDA approved to treat  moderate to severe obstructive sleep apnea  (OSA) in adults with obesity.

## 2023-11-28 NOTE — Telephone Encounter (Signed)
 Prior Authorization for patient (Zepbound 2.5MG /0.5ML pen-injectors) came through on cover my meds was submitted with last office notes awaiting approval or denial.  [id number]

## 2023-11-29 ENCOUNTER — Encounter: Payer: Self-pay | Admitting: Student

## 2023-12-01 ENCOUNTER — Other Ambulatory Visit: Payer: Self-pay

## 2023-12-01 ENCOUNTER — Emergency Department (HOSPITAL_BASED_OUTPATIENT_CLINIC_OR_DEPARTMENT_OTHER)

## 2023-12-01 ENCOUNTER — Emergency Department (HOSPITAL_BASED_OUTPATIENT_CLINIC_OR_DEPARTMENT_OTHER)
Admission: EM | Admit: 2023-12-01 | Discharge: 2023-12-02 | Disposition: A | Discharge status: Home / Self Care | Attending: Emergency Medicine | Admitting: Emergency Medicine

## 2023-12-01 ENCOUNTER — Emergency Department (HOSPITAL_BASED_OUTPATIENT_CLINIC_OR_DEPARTMENT_OTHER): Admitting: Radiology

## 2023-12-01 DIAGNOSIS — I120 Hypertensive chronic kidney disease with stage 5 chronic kidney disease or end stage renal disease: Secondary | ICD-10-CM | POA: Insufficient documentation

## 2023-12-01 DIAGNOSIS — Z992 Dependence on renal dialysis: Secondary | ICD-10-CM | POA: Insufficient documentation

## 2023-12-01 DIAGNOSIS — R000 Tachycardia, unspecified: Secondary | ICD-10-CM | POA: Insufficient documentation

## 2023-12-01 DIAGNOSIS — R079 Chest pain, unspecified: Secondary | ICD-10-CM | POA: Insufficient documentation

## 2023-12-01 DIAGNOSIS — G43909 Migraine, unspecified, not intractable, without status migrainosus: Secondary | ICD-10-CM | POA: Insufficient documentation

## 2023-12-01 DIAGNOSIS — R519 Headache, unspecified: Secondary | ICD-10-CM | POA: Diagnosis present

## 2023-12-01 DIAGNOSIS — K5730 Diverticulosis of large intestine without perforation or abscess without bleeding: Secondary | ICD-10-CM | POA: Diagnosis not present

## 2023-12-01 DIAGNOSIS — N186 End stage renal disease: Secondary | ICD-10-CM | POA: Diagnosis not present

## 2023-12-01 DIAGNOSIS — R1084 Generalized abdominal pain: Secondary | ICD-10-CM | POA: Insufficient documentation

## 2023-12-01 DIAGNOSIS — Z79899 Other long term (current) drug therapy: Secondary | ICD-10-CM | POA: Insufficient documentation

## 2023-12-01 LAB — CBC
HCT: 54.2 % — ABNORMAL HIGH (ref 36.0–46.0)
Hemoglobin: 15.8 g/dL — ABNORMAL HIGH (ref 12.0–15.0)
MCH: 25.9 pg — ABNORMAL LOW (ref 26.0–34.0)
MCHC: 29.2 g/dL — ABNORMAL LOW (ref 30.0–36.0)
MCV: 89 fL (ref 80.0–100.0)
Platelets: 262 10*3/uL (ref 150–400)
RBC: 6.09 MIL/uL — ABNORMAL HIGH (ref 3.87–5.11)
RDW: 20.6 % — ABNORMAL HIGH (ref 11.5–15.5)
WBC: 8.8 10*3/uL (ref 4.0–10.5)
nRBC: 0 % (ref 0.0–0.2)

## 2023-12-01 LAB — RESP PANEL BY RT-PCR (RSV, FLU A&B, COVID)  RVPGX2
Influenza A by PCR: NEGATIVE
Influenza B by PCR: NEGATIVE
Resp Syncytial Virus by PCR: NEGATIVE
SARS Coronavirus 2 by RT PCR: NEGATIVE

## 2023-12-01 LAB — BASIC METABOLIC PANEL
Anion gap: 20 — ABNORMAL HIGH (ref 5–15)
BUN: 49 mg/dL — ABNORMAL HIGH (ref 6–20)
CO2: 30 mmol/L (ref 22–32)
Calcium: 9 mg/dL (ref 8.9–10.3)
Chloride: 86 mmol/L — ABNORMAL LOW (ref 98–111)
Creatinine, Ser: 10.73 mg/dL — ABNORMAL HIGH (ref 0.44–1.00)
GFR, Estimated: 4 mL/min — ABNORMAL LOW (ref >60)
Glucose, Bld: 92 mg/dL (ref 70–99)
Potassium: 4.6 mmol/L (ref 3.5–5.1)
Sodium: 136 mmol/L (ref 135–145)

## 2023-12-01 LAB — TROPONIN I (HIGH SENSITIVITY): Troponin I (High Sensitivity): 7 ng/L (ref <18)

## 2023-12-01 MED ORDER — ACETAMINOPHEN 325 MG PO TABS
650.0000 mg | ORAL_TABLET | Freq: Once | ORAL | Status: AC
  Administered 2023-12-01: 650 mg via ORAL
  Filled 2023-12-01: qty 2

## 2023-12-01 MED ORDER — DIPHENHYDRAMINE HCL 50 MG/ML IJ SOLN
12.5000 mg | Freq: Once | INTRAMUSCULAR | Status: AC
  Administered 2023-12-01: 12.5 mg via INTRAVENOUS
  Filled 2023-12-01: qty 1

## 2023-12-01 MED ORDER — METOCLOPRAMIDE HCL 5 MG/ML IJ SOLN
10.0000 mg | Freq: Once | INTRAMUSCULAR | Status: AC
  Administered 2023-12-01: 10 mg via INTRAVENOUS
  Filled 2023-12-01: qty 2

## 2023-12-01 NOTE — ED Triage Notes (Addendum)
 Emesis x2-3 days. Migraine today. Body aches. Afebrile. Denies, diarrhea, SOB.  Cp starting this afternoon. Central. MWF Dialysis PT- last treatment today- full treatment.

## 2023-12-01 NOTE — ED Provider Notes (Signed)
 [place] EMERGENCY DEPARTMENT AT [facility] Provider Note   CSN: [id number] Arrival date & time: 12/01/23  2147     History  No chief complaint on file.   Sandy [name] is a 48 y.o. female.  HPI   48 year old female with medical history significant for hypertension, ESRD on hemodialysis MWF presenting to the emergency department with a viral syndrome.  The patient has had 2 to 3 days of NBNB emesis.  She last went to dialysis today had a full session.  She has had generalized bodyaches, has been afebrile.  She denies any cough or shortness of breath or diarrhea.  She has had some central chest discomfort with no radiation.  She endorses a headache today with associated body aches, denies any neck stiffness or rigidity.  She has been able to keep any Tylenol, she has been actively vomiting.  Home Medications Prior to Admission medications   Medication Sig Start Date End Date Taking? Authorizing Provider  midodrine (PROAMATINE) 10 MG tablet Take 10 mg by mouth 3 (three) times a week. Take 1 tablet by mouth before dialysis on Tue, Thurs, Sat 07/09/22   [provider]  oxyCODONE-acetaminophen (PERCOCET) 10-325 MG tablet Take 1 tablet by mouth 3 (three) times daily as needed for pain. Take 1 tablet by mouth 3 (three) times daily as needed for pain. 30 day supply 11/25/23 12/25/23  Morene Crocker, MD  tirzepatide [facility]) 2.5 MG/0.5ML Pen Inject 2.5 mg into the skin once a week. 11/27/23   Rocky Morel, DO  tirzepatide [facility]) 2.5 MG/0.5ML injection vial Inject 2.5 mg into the skin once a week. 11/27/23   Rocky Morel, DO  VELTASSA 8.4 g packet Take 1 packet by mouth daily as needed (for hyperkalemia). 08/08/22   Hongalgi, Maximino Greenland, MD      Allergies    Lisinopril    Review of Systems   Review of Systems  All other systems reviewed and are negative.   Physical Exam Updated Vital Signs BP (!) 126/96   Pulse (!) 101   Temp 98.2 F (36.8 C)   Resp 16    SpO2 96%  Physical Exam Vitals and nursing note reviewed.  Constitutional:      General: She is not in acute distress.    Appearance: She is well-developed.  HENT:     Head: Normocephalic and atraumatic.  Eyes:     Conjunctiva/sclera: Conjunctivae normal.  Cardiovascular:     Rate and Rhythm: Normal rate and regular rhythm.  Pulmonary:     Effort: Pulmonary effort is normal. No respiratory distress.     Breath sounds: Normal breath sounds.  Abdominal:     Palpations: Abdomen is soft.     Tenderness: There is no abdominal tenderness.  Musculoskeletal:        General: No swelling.     Cervical back: Neck supple.  Skin:    General: Skin is warm and dry.     Capillary Refill: Capillary refill takes less than 2 seconds.  Neurological:     General: No focal deficit present.     Mental Status: She is alert. Mental status is at baseline.  Psychiatric:        Mood and Affect: Mood normal.     ED Results / Procedures / Treatments   Labs (all labs ordered are listed, but only abnormal results are displayed) Labs Reviewed  CBC - Abnormal; Notable for the following components:      Result Value   RBC 6.09 (*)  Hemoglobin 15.8 (*)    HCT 54.2 (*)    MCH 25.9 (*)    MCHC 29.2 (*)    RDW 20.6 (*)    All other components within normal limits  BASIC METABOLIC PANEL - Abnormal; Notable for the following components:   Chloride 86 (*)    BUN 49 (*)    Creatinine, Ser 10.73 (*)    GFR, Estimated 4 (*)    Anion gap 20 (*)    All other components within normal limits  RESP PANEL BY RT-PCR (RSV, FLU A&B, COVID)  RVPGX2  TROPONIN I (HIGH SENSITIVITY)    EKG None  Radiology No results found.  Procedures Procedures    Medications Ordered in ED Medications - No data to display  ED Course/ Medical Decision Making/ A&P                                 Medical Decision Making Amount and/or Complexity of Data Reviewed Labs: ordered. Radiology: ordered.  Risk OTC  drugs. Prescription drug management.    48 year old female with medical history significant for hypertension, ESRD on hemodialysis MWF presenting to the emergency department with a viral syndrome.  The patient has had 2 to 3 days of NBNB emesis.  She last went to dialysis today had a full session.  She has had generalized bodyaches, has been afebrile.  She denies any cough or shortness of breath or diarrhea.  She has had some central chest discomfort with no radiation.  She endorses a headache today with associated body aches, denies any neck stiffness or rigidity.  She has been able to keep any Tylenol, she has been actively vomiting.  ***  {Document critical care time when appropriate:1} {Document review of labs and clinical decision tools ie heart score, Chads2Vasc2 etc:1}  {Document your independent review of radiology images, and any outside records:1} {Document your discussion with family members, caretakers, and with consultants:1} {Document social determinants of health affecting pt's care:1} {Document your decision making why or why not admission, treatments were needed:1} Final Clinical Impression(s) / ED Diagnoses Final diagnoses:  None    Rx / DC Orders ED Discharge Orders     None

## 2023-12-02 ENCOUNTER — Emergency Department (HOSPITAL_BASED_OUTPATIENT_CLINIC_OR_DEPARTMENT_OTHER)

## 2023-12-02 ENCOUNTER — Encounter (HOSPITAL_BASED_OUTPATIENT_CLINIC_OR_DEPARTMENT_OTHER): Payer: Self-pay

## 2023-12-02 DIAGNOSIS — G43909 Migraine, unspecified, not intractable, without status migrainosus: Secondary | ICD-10-CM | POA: Diagnosis not present

## 2023-12-02 LAB — TROPONIN I (HIGH SENSITIVITY): Troponin I (High Sensitivity): 7 ng/L (ref <18)

## 2023-12-02 LAB — HCG, QUANTITATIVE, PREGNANCY: hCG, Beta Chain, Quant, S: 10 m[IU]/mL — ABNORMAL HIGH (ref <5)

## 2023-12-02 MED ORDER — PROCHLORPERAZINE EDISYLATE 10 MG/2ML IJ SOLN
10.0000 mg | Freq: Once | INTRAMUSCULAR | Status: AC
  Administered 2023-12-02: 10 mg via INTRAVENOUS
  Filled 2023-12-02: qty 2

## 2023-12-02 MED ORDER — METOCLOPRAMIDE HCL 5 MG/ML IJ SOLN
10.0000 mg | Freq: Once | INTRAMUSCULAR | Status: AC
  Administered 2023-12-02: 10 mg via INTRAVENOUS
  Filled 2023-12-02: qty 2

## 2023-12-02 MED ORDER — SODIUM CHLORIDE 0.9 % IV BOLUS
250.0000 mL | Freq: Once | INTRAVENOUS | Status: AC
  Administered 2023-12-02: 250 mL via INTRAVENOUS

## 2023-12-02 MED ORDER — DIPHENHYDRAMINE HCL 50 MG/ML IJ SOLN
12.5000 mg | Freq: Once | INTRAMUSCULAR | Status: AC
  Administered 2023-12-02: 12.5 mg via INTRAVENOUS
  Filled 2023-12-02: qty 1

## 2023-12-02 MED ORDER — DEXAMETHASONE SODIUM PHOSPHATE 10 MG/ML IJ SOLN
10.0000 mg | Freq: Once | INTRAMUSCULAR | Status: AC
  Administered 2023-12-02: 10 mg via INTRAVENOUS
  Filled 2023-12-02: qty 1

## 2023-12-02 NOTE — ED Notes (Signed)
 Pt given a bag of white cheddar popcorn for snack as requested, pt also has gatorade.

## 2023-12-02 NOTE — ED Notes (Signed)
 Pt continus to c/o throbbing HA, nausea has improved, EDP made aware

## 2023-12-02 NOTE — ED Notes (Signed)
 Report given to [facility] RN

## 2023-12-02 NOTE — ED Notes (Signed)
 Patient transported to CT

## 2023-12-02 NOTE — Discharge Instructions (Addendum)
 Your CT imaging was overall reassuring.  Your symptoms of a migraine improved with a migraine cocktail.  Follow-up with your primary care provider, return to the Emergency Department for any severe worsening symptoms.

## 2023-12-02 NOTE — ED Notes (Signed)
 Pt reports vomiting x 3 days, now c/o migraine Cp started today, no c/o CP at this time

## 2023-12-04 ENCOUNTER — Telehealth: Payer: Self-pay | Admitting: Student

## 2023-12-04 ENCOUNTER — Other Ambulatory Visit: Payer: Self-pay | Admitting: Internal Medicine

## 2023-12-04 ENCOUNTER — Telehealth: Payer: Self-pay

## 2023-12-04 DIAGNOSIS — E669 Obesity, unspecified: Secondary | ICD-10-CM

## 2023-12-04 MED ORDER — TRULICITY 0.75 MG/0.5ML ~~LOC~~ SOAJ: 0.7500 mg | SUBCUTANEOUS | 3 refills | Status: AC

## 2023-12-04 NOTE — Telephone Encounter (Signed)
 Prior Authorization for patient has been denied (please see note from 3/7). Please address.

## 2023-12-04 NOTE — Telephone Encounter (Signed)
 Prior Authorization for patient (Trulicity 0.75MG /0.5ML auto-injectors) came through on cover my meds was submitted with last office notes awaiting approval or denial.  [id number]

## 2023-12-04 NOTE — Telephone Encounter (Signed)
 Patient called regarding the denial. Patient is requesting a call back to discuss alternative medications. Please advise.

## 2023-12-04 NOTE — Progress Notes (Signed)
 I called and talked with patient about the prior authorizations being denied for Wegovy, ozempic, mounjaro, and zepbound for weight loss. I will try trulicity but I suspect that insurance will not cover this medication. I shared concerns with patient.

## 2023-12-04 NOTE — Telephone Encounter (Signed)
 Dear Sandy [name], We received a request from your doctor for a  prior authorization on TRULICITY 0.75 MG/0.5 ML  PEN. Based on the information received, we are  unable to approve the request to have this drug  covered under your Part D benefit. The Medicare rule in the Prescription Drug  Manual (Chapter 6, Section 20.1) says drugs  used to help you lose weight are excluded from  Medicare Part D coverage. [employer] follows  Medicare rules. The information we have about  your case says your drug is being used for  weight loss and per Medicare rules isn't  covered.

## 2023-12-04 NOTE — Telephone Encounter (Unsigned)
 Copied from CRM [id number]. Topic: Clinical - Prescription Issue >> Dec 04, 2023  2:54 PM Philippa Chester F wrote: Reason for CRM:   tirzepatide (ZEPBOUND) 2.5 MG/0.5ML injection vial  Patient is calling in in reference to getting a prior authorization for the above prescription filled out for the pharmacy to submit to the patient's insurance. The patient stated the pharmacy will fax over documents related to the issue today in hopes of getting them filled out.   [id number] [place], [place] - [address] AT [facility] OF [facility] [address] [facility] [address] [facility] [address] [place] [address] Phone: [phone] Fax: [phone] Hours: Not open 24 hours

## 2023-12-11 ENCOUNTER — Telehealth: Payer: Self-pay | Admitting: *Deleted

## 2023-12-11 NOTE — Telephone Encounter (Signed)
 Message to be forwarded to PCP.

## 2023-12-11 NOTE — Telephone Encounter (Signed)
 Copied from CRM [id number]. Topic: Clinical - Medical Advice >> Dec 11, 2023  1:32 PM Hamdi H wrote: Reason for CRM: Pt was admitted into the hospital. NP Vernona Rieger called [place] from [facility] wanting the patients provider to know that she will need to stop giving the patient tylenol with her oxycodone. She wanted to relay this message to the patients provider. Laura's best call back number is her cell at [phone].

## 2023-12-16 ENCOUNTER — Other Ambulatory Visit: Payer: Self-pay | Admitting: Student

## 2023-12-16 ENCOUNTER — Ambulatory Visit (INDEPENDENT_AMBULATORY_CARE_PROVIDER_SITE_OTHER): Payer: Self-pay | Admitting: Student

## 2023-12-16 VITALS — BP 121/95 | HR 99 | Temp 98.0°F | Ht 66.0 in | Wt 207.4 lb

## 2023-12-16 DIAGNOSIS — M79672 Pain in left foot: Secondary | ICD-10-CM | POA: Diagnosis not present

## 2023-12-16 DIAGNOSIS — F17200 Nicotine dependence, unspecified, uncomplicated: Secondary | ICD-10-CM | POA: Diagnosis present

## 2023-12-16 DIAGNOSIS — G894 Chronic pain syndrome: Secondary | ICD-10-CM | POA: Diagnosis not present

## 2023-12-16 DIAGNOSIS — Z0000 Encounter for general adult medical examination without abnormal findings: Secondary | ICD-10-CM

## 2023-12-16 DIAGNOSIS — R7989 Other specified abnormal findings of blood chemistry: Secondary | ICD-10-CM

## 2023-12-16 DIAGNOSIS — Z992 Dependence on renal dialysis: Secondary | ICD-10-CM

## 2023-12-16 DIAGNOSIS — N186 End stage renal disease: Secondary | ICD-10-CM

## 2023-12-16 DIAGNOSIS — Z139 Encounter for screening, unspecified: Secondary | ICD-10-CM

## 2023-12-16 DIAGNOSIS — F17290 Nicotine dependence, other tobacco product, uncomplicated: Secondary | ICD-10-CM

## 2023-12-16 DIAGNOSIS — Z1231 Encounter for screening mammogram for malignant neoplasm of breast: Secondary | ICD-10-CM

## 2023-12-16 MED ORDER — NICOTINE POLACRILEX 2 MG MT GUM: 2.0000 mg | CHEWING_GUM | OROMUCOSAL | 0 refills | Status: DC | PRN

## 2023-12-16 MED ORDER — LIDOCAINE 4 % EX PTCH: 2.0000 | MEDICATED_PATCH | CUTANEOUS | 3 refills | Status: DC

## 2023-12-16 MED ORDER — OXYCODONE HCL 10 MG PO TABS: 5.0000 mg | ORAL_TABLET | Freq: Three times a day (TID) | ORAL | 0 refills | Status: DC | PRN

## 2023-12-16 MED ORDER — NICOTINE 14 MG/24HR TD PT24: 14.0000 mg | MEDICATED_PATCH | TRANSDERMAL | 0 refills | Status: DC

## 2023-12-16 NOTE — Assessment & Plan Note (Addendum)
 This was also noted during her hospitalization. Her x-ray was unremarkable.  Orthopedics was consulted curbside, who recommended an MRI foot for further evaluation for stress fracture or to be placed in a walking boot.  She opted for the walking boot and notes continued improvement in her pain.  She has no pain with the boot on and has 4/10 pain when walking.

## 2023-12-16 NOTE — Addendum Note (Signed)
 Addended byMorrie Sheldon on: 12/16/2023 02:04 PM   Modules accepted: Orders

## 2023-12-16 NOTE — Assessment & Plan Note (Signed)
 Patient was hospitalized on 12/09/2023.  She had gone to her vascular surgeon for a right arm AV graft placement, and was found to have been hyperkalemic at 6.4.  This was thought to have been diet related.  This resolved after 2 doses of Lokelma, 1 dose of IV bicarb, and hemodialysis.  Today, patient denies any signs or symptoms, and we will follow-up with a CMP. - Follow-up CMP

## 2023-12-16 NOTE — Progress Notes (Signed)
 SDOH referral to [facility] social worker Christen Butter

## 2023-12-16 NOTE — Assessment & Plan Note (Signed)
 During her hospitalization, her LFTs were incidentally noted to have been elevated.  Further workup revealed dilated common bile duct and pancreatic duct with liver lesions likely secondary to hemangiomas.  She denied any abdominal pain.  GI was consulted for further exploration of ducts, but she declined further workup and wanted to follow-up outpatient.  Today, she remains asymptomatic.  Patient notes that a GI referral was placed and that she is expecting a call back for outpatient follow-up.

## 2023-12-16 NOTE — Progress Notes (Signed)
 CC: Hospital follow-up  HPI: [name] is a 48 y.o. female living with a history stated below and presents today for hospital follow-up. Please see problem based assessment and plan for additional details.  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 2 mL 3   midodrine (PROAMATINE) 10 MG tablet Take 10 mg by mouth 3 (three) times a week. Take 1 tablet by mouth before dialysis on Tue, Thurs, Sat     VELTASSA 8.4 g packet Take 1 packet by mouth daily as needed (for hyperkalemia).     Current Facility-Administered Medications on File Prior to Visit  Medication Dose Route Frequency Provider Last Rate Last Admin   0.9 %  sodium chloride infusion  250 mL Intravenous PRN Maeola Harman, MD       sodium chloride flush (NS) 0.9 % injection 3 mL  3 mL Intravenous Q12H Maeola Harman, MD        Family History  Problem Relation Age of Onset   Diabetes Father    Colon cancer Neg Hx    Colon polyps Neg Hx    Esophageal cancer Neg Hx    Rectal cancer Neg Hx    Stomach cancer Neg Hx     Social History   Socioeconomic History   Marital status: Single    Spouse name: Not on file   Number of children: Not on file   Years of education: Not on file   Highest education level: Not on file  Occupational History   Not on file  Tobacco Use   Smoking status: Some Days    Current packs/day: 0.00    Types: Cigarettes    Last attempt to quit: 12/23/2015    Years since quitting: 7.9   Smokeless tobacco: Never   Tobacco comments:    2-3 cigs  Vaping Use   Vaping status: Every Day   Substance and Sexual Activity   Alcohol use: No    Alcohol/week: 0.0 standard drinks of alcohol   Drug use: No   Sexual activity: Not on file  Other Topics Concern   Not on file  Social History Narrative   Not on file   Social Drivers of Health   Financial Resource Strain: Low Risk  (12/09/2023)   Received from [facility]   Overall Financial Resource Strain (CARDIA)    Difficulty of Paying Living Expenses: Not hard at all  Recent Concern: Financial Resource Strain - High Risk (11/18/2023)   Overall Financial Resource Strain (CARDIA)    Difficulty of Paying Living Expenses: Very hard  Food Insecurity: No Food Insecurity (12/09/2023)   Received from [facility]   Hunger Vital Sign    Worried About Running Out of Food in the Last Year: Never true    Ran Out of Food in the Last Year: Never true  Recent Concern: Food Insecurity - Food Insecurity Present (11/18/2023)   Hunger Vital Sign    Worried About Running Out of Food in the Last Year: Often true    Ran Out of Food  in the Last Year: Often true  Transportation Needs: No Transportation Needs (12/10/2023)   Received from [facility] - Transportation    In the past 12 months, has lack of transportation kept you from medical appointments or from getting medications?: No    Lack of Transportation (Non-Medical): No  Physical Activity: Inactive (11/18/2023)   Exercise Vital Sign    Days of Exercise per Week: 0 days    Minutes of Exercise per Session: 0 min  Stress: No Stress Concern Present (11/18/2023)   [employer] of Occupational Health - Occupational Stress Questionnaire    Feeling of Stress : Only a little  Social Connections: Moderately Integrated (11/18/2023)   Social Connection and Isolation Panel [NHANES]    Frequency of Communication with Friends and Family: Twice a week    Frequency of Social Gatherings with Friends and Family: Twice a week    Attends  Religious Services: 1 to 4 times per year    Active Member of [employer] or Organizations: No    Attends Banker Meetings: Never    Marital Status: Married  Catering manager Violence: Not At Risk (11/18/2023)   Humiliation, Afraid, Rape, and Kick questionnaire    Fear of Current or Ex-Partner: No    Emotionally Abused: No    Physically Abused: No    Sexually Abused: No    Review of Systems: ROS negative except for what is noted on the assessment and plan.  Vitals:   12/16/23 1105  BP: (!) 121/95  Pulse: 99  Temp: 98 F (36.7 C)  TempSrc: Oral  SpO2: 100%  Weight: 207 lb 6.4 oz (94.1 kg)  Height: 5\' 6"  (1.676 m)    Physical Exam: Constitutional: well-appearing in no acute distress HENT: normocephalic atraumatic, mucous membranes moist Eyes: conjunctiva non-erythematous Neck: supple Cardiovascular: regular rate and rhythm, no m/r/g Pulmonary/Chest: normal work of breathing on room air, lungs clear to auscultation bilaterally Abdominal: soft, non-tender, non-distended MSK: normal bulk and tone; left boot in place Neurological: alert & oriented x 3, 5/5 strength in bilateral upper and lower extremities, normal gait Skin: warm and dry   Assessment & Plan:   ESRD on dialysis [facility]) Patient was hospitalized on 12/09/2023.  She had gone to her vascular surgeon for a right arm AV graft placement, and was found to have been hyperkalemic at 6.4.  This was thought to have been diet related.  This resolved after 2 doses of Lokelma, 1 dose of IV bicarb, and hemodialysis.  Today, patient denies any signs or symptoms, and we will follow-up with a CMP. - Follow-up CMP  Elevated LFTs During her hospitalization, her LFTs were incidentally noted to have been elevated.  Further workup revealed dilated common bile duct and pancreatic duct with liver lesions likely secondary to hemangiomas.  She denied any abdominal pain.  GI was consulted for further exploration of ducts, but she  declined further workup and wanted to follow-up outpatient.  Today, she remains asymptomatic.  Patient notes that a GI referral was placed and that she is expecting a call back for outpatient follow-up.   Left foot pain This was also noted during her hospitalization. Her x-ray was unremarkable.  Orthopedics was consulted curbside, who recommended an MRI foot for further evaluation for stress fracture or to be placed in a walking boot.  She opted for the walking boot and notes continued improvement in her pain.  She has no pain with the boot on and has 4/10  pain when walking.  Tobacco use disorder She is scheduled to follow-up with [facility] smoking cessation program in April.  Prior to discharge, Nicorette gum and nicotine patch were ordered but were not available at the pharmacy.  I placed these orders and patient was advised to follow-up with [facility].  Chronic pain syndrome Previously was receiving Percocet 10-325 mg 3 times daily as needed.  Given her elevated LFTs, acetaminophen was discontinued.  She was started on oxycodone 10 mg 3 times daily as needed.  Patient discussed with Dr.  Brigitte Pulse, MD  [facility] Internal Medicine, PGY-1 Date 12/16/2023 Time 11:47 AM

## 2023-12-16 NOTE — Assessment & Plan Note (Signed)
 She is scheduled to follow-up with [facility] smoking cessation program in April.  Prior to discharge, Nicorette gum and nicotine patch were ordered but were not available at the pharmacy.  I placed these orders and patient was advised to follow-up with [facility].

## 2023-12-16 NOTE — Patient Instructions (Signed)
 Thank you so much for coming to the clinic today!   I have sent in the refills for your meds.  We will see you in three months.   If you have any questions please feel free to the call the clinic at anytime at [phone]. It was a pleasure seeing you!  Best, Dr. Rayvon Char

## 2023-12-16 NOTE — Assessment & Plan Note (Signed)
 Previously was receiving Percocet 10-325 mg 3 times daily as needed.  Given her elevated LFTs, acetaminophen was discontinued.  She was started on oxycodone 10 mg 3 times daily as needed.

## 2023-12-17 ENCOUNTER — Encounter: Payer: Self-pay | Admitting: Student

## 2023-12-17 LAB — CMP14 + ANION GAP
ALT: 12 IU/L (ref 0–32)
AST: 58 IU/L — ABNORMAL HIGH (ref 0–40)
Albumin: 4.1 g/dL (ref 3.9–4.9)
Alkaline Phosphatase: 390 IU/L — ABNORMAL HIGH (ref 44–121)
Anion Gap: 28 mmol/L — ABNORMAL HIGH (ref 10.0–18.0)
BUN/Creatinine Ratio: 3 — ABNORMAL LOW (ref 9–23)
BUN: 41 mg/dL — ABNORMAL HIGH (ref 6–24)
Bilirubin Total: 0.3 mg/dL (ref 0.0–1.2)
CO2: 18 mmol/L — ABNORMAL LOW (ref 20–29)
Calcium: 8.2 mg/dL — ABNORMAL LOW (ref 8.7–10.2)
Chloride: 92 mmol/L — ABNORMAL LOW (ref 96–106)
Creatinine, Ser: 11.73 mg/dL — ABNORMAL HIGH (ref 0.57–1.00)
Globulin, Total: 4.2 g/dL (ref 1.5–4.5)
Glucose: 83 mg/dL (ref 70–99)
Potassium: 4.7 mmol/L (ref 3.5–5.2)
Sodium: 138 mmol/L (ref 134–144)
Total Protein: 8.3 g/dL (ref 6.0–8.5)
eGFR: 4 mL/min/{1.73_m2} — ABNORMAL LOW (ref >59)

## 2023-12-17 MED ORDER — OXYCODONE HCL 10 MG PO TABS: 10.0000 mg | ORAL_TABLET | Freq: Three times a day (TID) | ORAL | 0 refills | Status: DC | PRN

## 2023-12-17 NOTE — Addendum Note (Signed)
 Addended by: Morrie Sheldon on: 12/17/2023 02:37 PM   Modules accepted: Orders

## 2023-12-17 NOTE — Addendum Note (Signed)
 Addended by: Morrie Sheldon on: 12/17/2023 02:41 PM   Modules accepted: Orders

## 2023-12-18 NOTE — Progress Notes (Signed)
 Called patient to discuss lab results. K+ normal. LFTs remain elevated so encouraged to follow-up with her GI referral.

## 2023-12-19 NOTE — Progress Notes (Signed)
 Internal Medicine Clinic Attending  Case discussed with the resident at the time of the visit.  We reviewed the resident's history and exam and pertinent patient test results.  I agree with the assessment, diagnosis, and plan of care documented in the resident's note.

## 2023-12-25 ENCOUNTER — Telehealth: Payer: Self-pay | Admitting: Vascular Surgery

## 2023-12-25 ENCOUNTER — Ambulatory Visit (HOSPITAL_COMMUNITY)
Admission: RE | Admit: 2023-12-25 | Discharge: 2023-12-25 | Disposition: A | Discharge status: Home / Self Care | Attending: Nephrology | Admitting: Nephrology

## 2023-12-25 ENCOUNTER — Other Ambulatory Visit: Payer: Self-pay

## 2023-12-25 ENCOUNTER — Encounter (HOSPITAL_COMMUNITY)
Admission: RE | Disposition: A | Discharge status: Home / Self Care | Payer: Self-pay | Source: Home / Self Care | Attending: Nephrology

## 2023-12-25 DIAGNOSIS — D631 Anemia in chronic kidney disease: Secondary | ICD-10-CM | POA: Diagnosis not present

## 2023-12-25 DIAGNOSIS — N250 Renal osteodystrophy: Secondary | ICD-10-CM | POA: Insufficient documentation

## 2023-12-25 DIAGNOSIS — Y839 Surgical procedure, unspecified as the cause of abnormal reaction of the patient, or of later complication, without mention of misadventure at the time of the procedure: Secondary | ICD-10-CM | POA: Insufficient documentation

## 2023-12-25 DIAGNOSIS — I120 Hypertensive chronic kidney disease with stage 5 chronic kidney disease or end stage renal disease: Secondary | ICD-10-CM | POA: Diagnosis not present

## 2023-12-25 DIAGNOSIS — N186 End stage renal disease: Secondary | ICD-10-CM | POA: Insufficient documentation

## 2023-12-25 DIAGNOSIS — Z992 Dependence on renal dialysis: Secondary | ICD-10-CM | POA: Diagnosis not present

## 2023-12-25 DIAGNOSIS — T8249XA Other complication of vascular dialysis catheter, initial encounter: Secondary | ICD-10-CM | POA: Insufficient documentation

## 2023-12-25 HISTORY — PX: TUNNELLED CATHETER EXCHANGE: CATH118373

## 2023-12-25 HISTORY — PX: VENOUS ANGIOPLASTY: CATH118376

## 2023-12-25 SURGERY — TUNNELLED CATHETER EXCHANGE

## 2023-12-25 MED ORDER — HEPARIN (PORCINE) IN NACL 2-0.9 UNITS/ML
INTRAMUSCULAR | Status: AC | PRN
  Administered 2023-12-25: 500 mL

## 2023-12-25 MED ORDER — HEPARIN SODIUM (PORCINE) 1000 UNIT/ML IJ SOLN
INTRAMUSCULAR | Status: AC
  Filled 2023-12-25: qty 10

## 2023-12-25 MED ORDER — FENTANYL CITRATE (PF) 100 MCG/2ML IJ SOLN
INTRAMUSCULAR | Status: DC | PRN
  Administered 2023-12-25: 50 ug via INTRAVENOUS

## 2023-12-25 MED ORDER — LIDOCAINE HCL (PF) 1 % IJ SOLN
INTRAMUSCULAR | Status: AC
Start: 2023-12-25 — End: ?
  Filled 2023-12-25: qty 30

## 2023-12-25 MED ORDER — IODIXANOL 320 MG/ML IV SOLN
INTRAVENOUS | Status: DC | PRN
  Administered 2023-12-25: 4 mL via INTRAVENOUS

## 2023-12-25 MED ORDER — HEPARIN SODIUM (PORCINE) 1000 UNIT/ML IJ SOLN
INTRAMUSCULAR | Status: DC | PRN
  Administered 2023-12-25 (×2): 2100 [IU] via INTRAVENOUS

## 2023-12-25 MED ORDER — FENTANYL CITRATE (PF) 100 MCG/2ML IJ SOLN
INTRAMUSCULAR | Status: AC
  Filled 2023-12-25: qty 2

## 2023-12-25 MED ORDER — MIDAZOLAM HCL 2 MG/2ML IJ SOLN
INTRAMUSCULAR | Status: AC
  Filled 2023-12-25: qty 2

## 2023-12-25 MED ORDER — MIDAZOLAM HCL 2 MG/2ML IJ SOLN
INTRAMUSCULAR | Status: DC | PRN
  Administered 2023-12-25: 1 mg via INTRAVENOUS

## 2023-12-25 MED ORDER — LIDOCAINE HCL 2 % IJ SOLN
INTRAMUSCULAR | Status: DC | PRN
  Administered 2023-12-25: 5 mL via INTRADERMAL

## 2023-12-25 SURGICAL SUPPLY — 8 items
BAG SNAP BAND KOVER 36X36 (MISCELLANEOUS) IMPLANT
BALLN MUSTANG 10.0X40 75 (BALLOONS) ×2 IMPLANT
BALLOON MUSTANG 10.0X40 75 (BALLOONS) IMPLANT
CATH PALINDROME 14.5FR 28 (CATHETERS) IMPLANT
COVER DOME SNAP 22 D (MISCELLANEOUS) IMPLANT
GLIDEWIRE STIFF .35X180X3 HYDR (WIRE) IMPLANT
SYR MEDALLION 10ML (SYRINGE) IMPLANT
TRAY PV CATH (CUSTOM PROCEDURE TRAY) IMPLANT

## 2023-12-25 NOTE — Op Note (Signed)
 Patient presents with poor flows in her LIJ tunneled hemodialysis catheter (28 cm cuff to tip- Palindrome) that was placed about 3 mths ago. On examination, aspiration from both ports is poor. Chst x-ray confirms the catheter tip is appropriately positioned in the RA. The catheter cuff is 2-3 cm deep to the exit site.    Summary:  1) The patient had a successful 28 cm CTT Palindrome hemodialysis catheter exchange in the left internal jugular vein. 2) Restriction noted in the innominate vein treated with a 10x4 Mustang balloon angioplasty.   3) Okay to use catheter immediately. 4)   Right transposed basilic vein in the forearm placed in [facility] (patient was a self-referral) is not far from thrombosing with very poor flows.  I have already spoken with Dr. Chestine Spore who is more than happy to see her in clinic.  Description of procedure: The left neck, chest and the catheter were prepped and draped in the usual sterile fashion. The exit site and adjacent tunnel track were anesthetized with lidocaine 1% with epinephrine. The cuff was removed by traction/blunt dissection. Venogram performed by sequentially injecting contrast through both ports demonstrated a significant restriction in the right innominate vein prior to its transition with the SVC. Wire was manipulated and then advanced into the IVC.   The catheter was removed and replaced with a  10x4 Mustang angioplasty balloon. The restriction in the innominate vein stenosis was treated with balloon angioplasty (16109) with the  10x4 Mustang angioplasty balloon to full effacement.   A new 28 cm cuff to tip Palindrome catheter (60454) was then inserted over the wire under real time fluoroscopic guidance; I then injected through the non-wired port confirming the absence of any restriction with <30% residual + no evidence of extravasation. I then advanced the catheter till the tip was in the right atrium, the cuff was 2cm deep to the exit site. Aspiration and  flushing of both limbs of the catheter confirmed excellent flush and return through both ports.   The hub was sutured to the right upper chest area with 2-0 nylon sutures. Sterile dressings were placed, and the patient returned to recovery in stable condition.  Sedation: 1 mg Versed, 50 mcg Fentanyl.  Sedation time. 28 min  Contrast. 4 mL  Monitoring: Because of the patient's comorbid conditions and sedation during the procedure, continuous EKG monitoring and O2 saturation monitoring was performed throughout the procedure by the RN. There were no abnormal arrhythmias encountered.  Complications: None.  Diagnoses:   T82.49XA Other complication of vascular dialysis catheter (Poor flows) N18.6 End stage renal disease  Z99.2 Dialysis dependence  Procedures Coding:  36581 Tunneled catheter exchange 37248 Venous angioplasty, innominate vein  09811  Fluoroscopy guidance for catheter exchange. B1478 Contrast  Recommendations: Remove the suture in 3 weeks. 2.   Report any blood flow problems to  [facility] (contact is [place]).  Discharge: The patient was discharged home in stable condition. The patient was given education regarding the care of the catheter and specific instructions in case of any problems.

## 2023-12-25 NOTE — H&P (Addendum)
 Chief Complaint: Decreased flows  Interval H&P   The patient has presented today for tunneled catheter insertion to initiate dialysis.  Various methods of treatment have been discussed with the patient.  After consideration of risk, benefits and other options for treatment, the patient has consented to a tunneled catheter insertion.   Risks  of bleeding, pain, infection, nonhealing wound, lung and carotid artery injury were explained to the patient.  The patient's history has been reviewed and the patient has been examined, no changes in status.  Stable for tunneled catheter insertion.  I have reviewed the patient's chart and labs.  Questions were answered to the patient's satisfaction.  Assessment/Plan: ESRD dialyzing at MWF regimen  Decreased access flows here for a catheter exchange.  Patient just had a right forearm brachial basilic vein transposition a week ago in [place] - planning on catheter exchange.  I also discussed the case with Dr. Chestine Spore given that there is only a very weak high-pitched early systolic bruit noted.  She is willing to follow-up with Dr. Chestine Spore to see if that fistula can be salvaged versus going to the upper arm for a new access.   Renal osteodystrophy - continue binders per home regimen. Anemia - managed with ESA's and IV iron at dialysis center. HTN - resume home regimen.   HPI: Sandy [name] is an 48 y.o. female with a history of hypertension, parathyroidectomy, end-stage renal disease secondary FSGS with a thrombosed left upper arm brachiobasilic transposed fistula placed in [place] with tunneled catheter placement on September 19, 2023.  Being referred for poor flows.  ROS Per HPI.  Chemistry and CBC: Creatinine, Ser  Date/Time Value Ref Range Status  12/16/2023 11:37 AM 11.73 (H) 0.57 - 1.00 mg/dL Final    Comment:    **Verified by repeat analysis**  12/01/2023 10:29 PM 10.73 (H) 0.44 - 1.00 mg/dL Final  19/14/7829 56:21 PM 14.90 (H) 0.44 - 1.00 mg/dL  Final  30/86/5784 69:62 PM 14.85 (H) 0.44 - 1.00 mg/dL Final  95/28/4132 44:01 PM 10.50 (H) 0.44 - 1.00 mg/dL Final  02/72/5366 44:03 PM 8.25 (H) 0.44 - 1.00 mg/dL Final  47/42/5956 38:75 AM 10.86 (H) 0.44 - 1.00 mg/dL Final  64/33/2951 88:41 AM 7.82 (H) 0.44 - 1.00 mg/dL Final  66/02/3015 01:09 AM 9.21 (H) 0.44 - 1.00 mg/dL Final  32/35/5732 20:25 AM 14.50 (H) 0.44 - 1.00 mg/dL Final  42/70/6237 62:83 PM 14.18 (H) 0.44 - 1.00 mg/dL Final  15/17/6160 73:71 PM 13.78 (H) 0.44 - 1.00 mg/dL Final  03/18/9484 46:27 AM 6.71 (H) 0.44 - 1.00 mg/dL Final  03/50/0938 18:29 AM 5.85 (H) 0.44 - 1.00 mg/dL Final  93/71/6967 89:38 AM 4.55 (H) 0.44 - 1.00 mg/dL Final  07/09/5101 58:52 AM 5.74 (H) 0.44 - 1.00 mg/dL Final    Comment:    DELTA CHECK NOTED  01/18/2016 04:55 AM 3.72 (H) 0.44 - 1.00 mg/dL Final    Comment:    DELTA CHECK NOTED  01/17/2016 09:54 AM 5.60 (H) 0.44 - 1.00 mg/dL Final  77/82/4235 36:14 AM 5.60 (H) 0.44 - 1.00 mg/dL Final  43/15/4008 67:61 AM 5.21 (H) 0.44 - 1.00 mg/dL Final  95/05/3266 12:45 AM 5.30 (H) 0.44 - 1.00 mg/dL Final  80/99/8338 25:05 AM 3.55 (H) 0.44 - 1.00 mg/dL Final  39/76/7341 93:79 AM 5.13 (H) 0.44 - 1.00 mg/dL Final  02/40/9735 32:99 AM 4.77 (H) 0.44 - 1.00 mg/dL Final  24/26/8341 96:22 AM 3.80 (H) 0.44 - 1.00 mg/dL Final  29/79/8921 19:41 AM 4.21 (H)  0.44 - 1.00 mg/dL Final  16/06/9603 54:09 AM 3.42 (H) 0.44 - 1.00 mg/dL Final  81/19/1478 29:56 AM 4.14 (H) 0.44 - 1.00 mg/dL Final  21/30/8657 84:69 AM 3.75 (H) 0.44 - 1.00 mg/dL Final  62/95/2841 32:44 AM 3.63 (H) 0.44 - 1.00 mg/dL Final  09/25/7251 66:44 AM 3.38 (H) 0.44 - 1.00 mg/dL Final  03/47/4259 56:38 AM 3.26 (H) 0.44 - 1.00 mg/dL Final  75/64/3329 51:88 PM 3.13 (H) 0.44 - 1.00 mg/dL Final  41/66/0630 16:01 PM 3.07 (H) 0.44 - 1.00 mg/dL Final  09/32/3557 32:20 AM 3.19 (H) 0.44 - 1.00 mg/dL Final  25/42/7062 37:62 AM 3.33 (H) 0.44 - 1.00 mg/dL Final  83/15/1761 60:73 AM 3.40 (H) 0.44 - 1.00 mg/dL  Final  71/02/2693 85:46 AM 3.51 (H) 0.44 - 1.00 mg/dL Final  27/11/5007 38:18 AM 3.37 (H) 0.44 - 1.00 mg/dL Final  29/93/7169 67:89 AM 3.46 (H) 0.44 - 1.00 mg/dL Final  38/06/1750 02:58 AM 3.43 (H) 0.44 - 1.00 mg/dL Final  52/77/8242 35:36 AM 3.37 (H) 0.44 - 1.00 mg/dL Final  14/43/1540 08:67 AM 2.97 (H) 0.44 - 1.00 mg/dL Final  61/95/0932 67:12 AM 3.16 (H) 0.44 - 1.00 mg/dL Final  45/80/9983 38:25 AM 3.51 (H) 0.44 - 1.00 mg/dL Final  05/39/7673 41:93 PM 3.54 (H) 0.44 - 1.00 mg/dL Final   No results for input(s): "NA", "K", "CL", "CO2", "GLUCOSE", "BUN", "CREATININE", "CALCIUM", "PHOS" in the last 168 hours.  Invalid input(s): "ALB" No results for input(s): "WBC", "NEUTROABS", "HGB", "HCT", "MCV", "PLT" in the last 168 hours. Liver Function Tests: No results for input(s): "AST", "ALT", "ALKPHOS", "BILITOT", "PROT", "ALBUMIN" in the last 168 hours. No results for input(s): "LIPASE", "AMYLASE" in the last 168 hours. No results for input(s): "AMMONIA" in the last 168 hours. Cardiac Enzymes: No results for input(s): "CKTOTAL", "CKMB", "CKMBINDEX", "TROPONINI" in the last 168 hours. Iron Studies: No results for input(s): "IRON", "TIBC", "TRANSFERRIN", "FERRITIN" in the last 72 hours. PT/INR: @LABRCNTIP (inr:5)  Xrays/Other Studies: )No results found for this or any previous visit (from the past 48 hours). No results found.  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)    PSH:   Past Surgical History:  Procedure Laterality Date   A/V FISTULAGRAM Left 02/10/2023   Procedure: A/V Fistulagram;  Surgeon: Maeola Harman, MD;  Location: [facility] INVASIVE CV LAB;  Service: Cardiovascular;  Laterality: Left;   AV FISTULA PLACEMENT Left 11/24/2015   Procedure:  ARTERIOVENOUS (AV) FISTULA CREATION-LEFT UPPER ARM;  Surgeon: Larina Earthly, MD;  Location: [facility] OR;  Service: Vascular;  Laterality: Left;   AV FISTULA PLACEMENT Left 01/15/2016   Procedure: Embolectomy of Left Arm Fistula with revision of Brachiocephalic fistula and vein patch angioplasty;  Surgeon: Larina Earthly, MD;  Location: [facility] OR;  Service: Vascular;  Laterality: Left;   AV FISTULA PLACEMENT Right 01/17/2016   Procedure: Right Arm Brachial ARTERIOVENOUS FISTULA CREATION ;  Surgeon: Larina Earthly, MD;  Location: [facility] OR;  Service: Vascular;  Laterality: Right;   CESAREAN SECTION     DIALYSIS/PERMA CATHETER INSERTION  09/19/2023   Procedure: DIALYSIS/PERMA CATHETER INSERTION;  Surgeon: Ethelene Hal, MD;  Location: [facility] INVASIVE CV LAB;  Service: Cardiovascular;;   INSERTION OF DIALYSIS CATHETER Right 01/08/2016  Procedure: INSERTION OF DIALYSIS CATHETER L;  Surgeon: Larina Earthly, MD;  Location: [facility] OR;  Service: Vascular;  Laterality: Right;   PERIPHERAL VASCULAR BALLOON ANGIOPLASTY  02/10/2023   Procedure: PERIPHERAL VASCULAR BALLOON ANGIOPLASTY;  Surgeon: Maeola Harman, MD;  Location: [facility] INVASIVE CV LAB;  Service: Cardiovascular;;  L AV fistula   RENAL BIOPSY      Allergies:  Allergies  Allergen Reactions   Lisinopril Cough    Medications:   Prior to Admission medications   Medication Sig Start Date End Date Taking? Authorizing Provider  AURYXIA 1 GM 210 MG(Fe) tablet Take 420 mg by mouth 3 (three) times daily with meals. 10/31/23  Yes [provider]  cyclobenzaprine (FLEXERIL) 10 MG tablet Take 10 mg by mouth at bedtime. 08/09/22  Yes [provider]  midodrine (PROAMATINE) 10 MG tablet Take 10 mg by mouth daily as needed (Low blood pressure). Mon., Wed., Fri. 07/09/22  Yes [provider]  Oxycodone HCl 10 MG TABS Take 1 tablet (10 mg total) by mouth every 8 (eight) hours as needed. 12/17/23  Yes Morrie Sheldon, MD  VELTASSA 8.4 g packet Take 1 packet by  mouth daily as needed (for hyperkalemia). 08/08/22  Yes Hongalgi, Maximino Greenland, MD  Dulaglutide (TRULICITY) 0.75 MG/0.5ML SOAJ Inject 0.75 mg into the skin once a week. 12/04/23   Masters, Katie, DO  lidocaine (HM LIDOCAINE PATCH) 4 % Place 2 patches onto the skin daily. Apply patch to the most painful area for up to 12 hours in a 24 hour period Patient not taking: Reported on 12/24/2023 12/16/23   Morrie Sheldon, MD  nicotine (NICODERM CQ - DOSED IN MG/24 HOURS) 14 mg/24hr patch Place 1 patch (14 mg total) onto the skin daily. Patient not taking: Reported on 12/24/2023 12/16/23   Morrie Sheldon, MD  nicotine polacrilex (NICORETTE) 2 MG gum Take 1 each (2 mg total) by mouth as needed for smoking cessation. Place 1 lozenge between cheek and gum every 30 minutes for smoking urges. Max 10 per day. Patient not taking: Reported on 12/24/2023 12/16/23   Morrie Sheldon, MD    Discontinued Meds:  There are no discontinued medications.  Social History:  reports that she has been smoking cigarettes. She has never used smokeless tobacco. She reports that she does not drink alcohol and does not use drugs.  Family History:   Family History  Problem Relation Age of Onset   Diabetes Father    Colon cancer Neg Hx    Colon polyps Neg Hx    Esophageal cancer Neg Hx    Rectal cancer Neg Hx    Stomach cancer Neg Hx     There were no vitals taken for this visit. GEN: NAD, A&Ox3, NCAT HEENT: No conjunctival pallor, EOMI NECK: Supple, no thyromegaly LUNGS: CTA B/L no rales, rhonchi or wheezing CV: RRR, No M/R/G ABD: SNDNT +BS  EXT: No lower extremity edema ACCESS: Right forearm basilic vein transposed fistula with a very high-pitched weak early systolic bruit, LIJ TC       Sandy [name], Len Blalock, MD 12/25/2023, 9:34 AM

## 2023-12-26 ENCOUNTER — Encounter (HOSPITAL_COMMUNITY): Payer: Self-pay | Admitting: Nephrology

## 2023-12-30 ENCOUNTER — Telehealth: Payer: Self-pay | Admitting: *Deleted

## 2023-12-30 NOTE — Telephone Encounter (Signed)
 Mammogram appointment 01-22-2024 @ 8:40 am / arrive 8:20 am, breast center- patient contacted with this appointment and appointment also mailed to patient with information regarding the 75.00 no show fee.

## 2024-01-06 ENCOUNTER — Encounter: Payer: Self-pay | Admitting: Student

## 2024-01-08 ENCOUNTER — Other Ambulatory Visit: Payer: Self-pay | Admitting: Student

## 2024-01-08 DIAGNOSIS — G894 Chronic pain syndrome: Secondary | ICD-10-CM

## 2024-01-08 MED ORDER — OXYCODONE HCL 10 MG PO TABS: 10.0000 mg | ORAL_TABLET | Freq: Three times a day (TID) | ORAL | 0 refills | Status: DC | PRN

## 2024-01-08 MED ORDER — DICLOFENAC SODIUM 1 % EX GEL: 4.0000 g | Freq: Four times a day (QID) | CUTANEOUS | 0 refills | Status: AC

## 2024-01-08 NOTE — Progress Notes (Signed)
 Patient calls for chronic pain management, which has been recently changed in the setting of elevated liver enzymes.  Patient used to receive oxycodone/acetaminophen 10-325 mg up to 3 times a day.  Sandy [name] underwent a fistula revision on 319 at [facility]; During this hospitalization she was also found to have fifth metatarsal fracture that has been slow to heal.  After her discharge, she was given care at usual oxycodone medication.  Because of the increased pain in the acute setting, patient was taking extra medication for about 5 to 6 days.  She discussed that because of the pain contract in our clinic, she was unable to get extra pain medication from other providers.  She tells me that the fistula pain has resolved and that the fracture pain is improving.  She has resumed taking her usual oxycodone 10 mg 3 times daily dosing for the past couple of weeks.  She has been adherent to her dialysis sessions.   Patient has also seen Ortho about 2 in the past couple of days, and has follow-up appointment in 4 weeks.  Her fracture has been slow to heal; she was recommended to use her boot for weightbearing.  She has concerns about potentially needing a surgery.  Discussed her pain contract and the need to call our office if there is any new, acute pain that may necessitate additional opioid coverage.  Patient expressed understanding.  Patient has a good record of refill history and good communication with our office otherwise.  Will refill medication today and provided precautionary symptoms and behavior that would prompt returning a call to our office.  Plan: - Will fill 10 mg oxycodone to be used up to 3 times a day as needed needed - Discussed with Drs Jannice Mends and Jarvis Mesa

## 2024-01-20 ENCOUNTER — Ambulatory Visit (INDEPENDENT_AMBULATORY_CARE_PROVIDER_SITE_OTHER): Payer: Self-pay | Admitting: Licensed Clinical Social Worker

## 2024-01-20 DIAGNOSIS — Z139 Encounter for screening, unspecified: Secondary | ICD-10-CM

## 2024-01-20 DIAGNOSIS — Z1331 Encounter for screening for depression: Secondary | ICD-10-CM

## 2024-01-20 NOTE — BH Specialist Note (Signed)
 Integrated Behavioral Health via Telemedicine Visit  01/20/2024 Sandy [name] [id number]  Number of Integrated Behavioral Health Clinician visits: 1- Initial Visit  Session Start time: 0900   Session End time: 0930  Total time in minutes: 30   Referring Provider: PCP Patient/Family location: Home [facility] Provider location: Office All persons participating in visit: [facility] and Patient Types of Service: Telephone visit and Health & Behavioral Assessment/Intervention  I connected with Sandy [name]  via  Telephone and verified that I am speaking with the correct person using two identifiers. Discussed confidentiality: Yes   I discussed the limitations of telemedicine and the availability of in person appointments.  Discussed there is a possibility of technology failure and discussed alternative modes of communication if that failure occurs.  I discussed that engaging in this telemedicine visit, they consent to the provision of behavioral healthcare and the services will be billed under their insurance.  Patient and/or legal guardian expressed understanding and consented to Telemedicine visit: Yes   Presenting Concerns: Patient and/or family reports the following symptoms/concerns:  On January 20, 2024, a telehealth session was conducted via a HIPAA-compliant platform using telephone communication. The clinical therapist introduced themselves, explained their role, and reviewed confidentiality protocols to ensure the patient's understanding and comfort.  During the session, the patient discussed experiencing financial hardship, expressing concerns about employment and financial stability. The therapist provided supportive counseling, exploring the patient's feelings and identifying potential barriers to achieving financial goals. The patient acknowledged the challenges and expressed a desire for ongoing support to enhance motivation and develop strategies for improvement.  The patient indicated  a preference for continued sessions with this therapist, recognizing the value in establishing a therapeutic relationship aimed at achieving their employment and financial stability goals. A follow-up session was scheduled for Feb 03, 2024, to further address these concerns and monitor progress.   RESOURCES!!!!  ?? Housing Assistance  1. [employer] Physiological scientist) - Optometrist Program  - Description: Provides rental assistance to very-low-income households, including families, elderly, and disabled individuals.  - Eligibility: Must meet income and family composition requirements.  - Application Process: Apply online through the state portal at https://epass.kabucove.com  - Contact:   - Address: [address], [address], [place], [place] [address]    - Phone: [phone]   - Email: [email or web address]    - Website: https://www.gha-[place].org/housing-programs/housing-choice-voucher-program/default.aspx  2. [facility] DSS - Low Income Energy Assistance Program (LIEAP) - Description: Assists eligible households with heating bills. - Eligibility: Households with at least one [place] citizen or eligible non-citizen, meeting income and resource limits, and responsible for heating costs. - Application Period: December 1-31 for households with a member aged 31 or older or disabled; January 1-March 31 for all other households or until funds are exhausted. - Contact:   - Address: [address], [place], [place] [address]   - Phone: [phone]   - Website: https://www.liheapoffices.com/property[place]  3. [facility] - Human resources officer (ERAP) - Description: Offers up to 12 months of rental and utility assistance for households affected by COVID-19. - Eligibility: Must meet income guidelines and demonstrate need due to COVID-19 impact. - Application Process: Apply online at https://greensborohousingcoalition.org/erap/ -  Contact:   - Phone: [phone]   - Email: info@gsohc .org   - Website: [email or web address]://greensborohousingcoalition.org/erap/  ?? Utility Assistance 1. Crisis Intervention Program (CIP) - Description: Provides emergency assistance for heating and cooling bills. - Eligibility: Must meet income guidelines and be experiencing a heating or cooling-related emergency. - Application Process:  Apply online through the state portal at https://epass.[place]ove.com - Contact:   - Phone: [phone]   - Website: splashshop.com  JOBS HIRING!!!  [place] Role: Occupational psychologist  Requirements: GED or high school diploma; customer service experience a plus  Location: [address] [place], [place]  5. [employer] [place] Role: Microbiologist II  Requirements: GED or high school diploma; 1-3 years of experience in a call/contact center  sistemancia.[email or web address]/careers/greensboronc/jobs/newprint/[email or web address]?utm_[email or web address]  2. [place]  [employer] - Customer Service Agent I Location: [place], [place]  Salary: $09,811 [phone] annually  Requirements:  High School Diploma/GED  Experience in a call center environment  Ability to educate and enforce compliance with   revenue laws  Apply: Customer Service Agent I - [place] [employer] Department of Revenue  sistemancia.[email or web address]/careers/[place]/jobs/[email or web address]?utm_[email or web address]      Patient and/or Family's Strengths/Protective Factors: Sense of purpose  Goals Addressed: Patient will:  Reduce symptoms of: stress   Increase knowledge and/or ability of: self-management skills   Demonstrate ability to: Increase healthy adjustment to current life circumstances  Progress towards Goals: Ongoing  Interventions: Interventions utilized:  Link to [employer] Standardized  Assessments completed: Patient declined screening  Patient and/or Family Response: Patient agreed to ongoing services.  Assessment: Patient currently experiencing Financial Hardship.   Patient may benefit from Financial resources and employment help .  Plan: Follow up with behavioral health clinician on : May 13  I discussed the assessment and treatment plan with the patient and/or parent/guardian. They were provided an opportunity to ask questions and all were answered. They agreed with the plan and demonstrated an understanding of the instructions.   They were advised to call back or seek an in-person evaluation if the symptoms worsen or if the condition fails to improve as anticipated.  Amie Bald, MSW, LCSW-A She/Her Behavioral Health Clinician [facility]  Internal Medicine Center Direct [phone]  Fax [phone] Main Office Phone: [phone] [address] [place] [place], [place] [address] Website: [facility] Internal Medicine [facility]  [place], [place]  [place]

## 2024-01-22 ENCOUNTER — Ambulatory Visit
Admission: RE | Admit: 2024-01-22 | Discharge: 2024-01-22 | Disposition: A | Discharge status: Home / Self Care | Source: Ambulatory Visit | Attending: Internal Medicine | Admitting: Internal Medicine

## 2024-01-22 DIAGNOSIS — Z1231 Encounter for screening mammogram for malignant neoplasm of breast: Secondary | ICD-10-CM

## 2024-02-03 ENCOUNTER — Ambulatory Visit: Admitting: Licensed Clinical Social Worker

## 2024-02-04 ENCOUNTER — Encounter: Payer: Self-pay | Admitting: Student

## 2024-02-04 NOTE — Telephone Encounter (Signed)
 Oxycodone  has expired and dropped off pt's med list.

## 2024-02-05 ENCOUNTER — Encounter: Payer: Self-pay | Admitting: Student

## 2024-02-06 ENCOUNTER — Telehealth: Payer: Self-pay | Admitting: Student

## 2024-02-06 ENCOUNTER — Other Ambulatory Visit: Payer: Self-pay | Admitting: Student

## 2024-02-06 DIAGNOSIS — G894 Chronic pain syndrome: Secondary | ICD-10-CM

## 2024-02-06 MED ORDER — OXYCODONE HCL 10 MG PO TABS: 10.0000 mg | ORAL_TABLET | Freq: Three times a day (TID) | ORAL | 0 refills | Status: DC | PRN

## 2024-02-06 NOTE — Telephone Encounter (Signed)
 Copied from CRM [id number]. Topic: Clinical - Medication Refill >> Feb 06, 2024 11:46 AM Danelle Dunning F wrote: Patient called in inquiring about her pain medication which is listed below; Patient was previously taken off of HYDROcodone -acetaminophen  (NORCO/VICODIN) 5-325 MG per tablet to ensure that the medication doesn't impact her liver long term; The medication listed below was not due to expire until 07/06/24 but was ended for reason not provided to the patient; Patient is in need of medication so please assist patient with refill and call if there are any follow up questions.   Medication: Oxycodone  HCl 10 MG TABS  Has the patient contacted their pharmacy? Yes   This is the patient's preferred pharmacy:  [id number] [place], [place] - [address] AT [facility] [address] [facility] [address] [place] [address] Phone: [phone] Fax: [phone]  Is this the correct pharmacy for this prescription? Yes   Has the prescription been filled recently? Yes  Is the patient out of the medication? Yes  Has the patient been seen for an appointment in the last year OR does the patient have an upcoming appointment? Yes  Can we respond through MyChart? Yes  Agent: Please be advised that Rx refills may take up to 3 business days. We ask that you follow-up with your pharmacy.

## 2024-02-06 NOTE — Telephone Encounter (Signed)
 Reviewed PDMP - appropriate. Will refill 10 mg oxycodone  TID PRN for 25 days. Sent to preferred pharmacy

## 2024-02-09 NOTE — Telephone Encounter (Signed)
 Unable to schedule pt appt.

## 2024-02-12 ENCOUNTER — Encounter: Payer: Self-pay | Admitting: Student

## 2024-02-13 ENCOUNTER — Other Ambulatory Visit (HOSPITAL_COMMUNITY): Payer: Self-pay

## 2024-02-19 ENCOUNTER — Ambulatory Visit (HOSPITAL_COMMUNITY)
Admit: 2024-02-19 | Discharge: 2024-02-19 | Disposition: A | Discharge status: Home / Self Care | Source: Ambulatory Visit | Attending: Vascular Surgery | Admitting: Vascular Surgery

## 2024-02-19 ENCOUNTER — Encounter (HOSPITAL_COMMUNITY)
Disposition: A | Discharge status: Home / Self Care | Payer: Self-pay | Source: Ambulatory Visit | Attending: Vascular Surgery

## 2024-02-19 ENCOUNTER — Other Ambulatory Visit: Payer: Self-pay

## 2024-02-19 DIAGNOSIS — T8241XA Breakdown (mechanical) of vascular dialysis catheter, initial encounter: Secondary | ICD-10-CM | POA: Insufficient documentation

## 2024-02-19 DIAGNOSIS — T8249XA Other complication of vascular dialysis catheter, initial encounter: Secondary | ICD-10-CM

## 2024-02-19 DIAGNOSIS — Z992 Dependence on renal dialysis: Secondary | ICD-10-CM

## 2024-02-19 DIAGNOSIS — I120 Hypertensive chronic kidney disease with stage 5 chronic kidney disease or end stage renal disease: Secondary | ICD-10-CM | POA: Insufficient documentation

## 2024-02-19 DIAGNOSIS — F17210 Nicotine dependence, cigarettes, uncomplicated: Secondary | ICD-10-CM | POA: Insufficient documentation

## 2024-02-19 DIAGNOSIS — Y718 Miscellaneous cardiovascular devices associated with adverse incidents, not elsewhere classified: Secondary | ICD-10-CM | POA: Diagnosis not present

## 2024-02-19 DIAGNOSIS — N186 End stage renal disease: Secondary | ICD-10-CM | POA: Diagnosis not present

## 2024-02-19 HISTORY — PX: TUNNELLED CATHETER EXCHANGE: CATH118373

## 2024-02-19 LAB — POCT I-STAT, CHEM 8
BUN: 59 mg/dL — ABNORMAL HIGH (ref 6–20)
Calcium, Ion: 0.94 mmol/L — ABNORMAL LOW (ref 1.15–1.40)
Chloride: 101 mmol/L (ref 98–111)
Creatinine, Ser: 13.3 mg/dL — ABNORMAL HIGH (ref 0.44–1.00)
Glucose, Bld: 125 mg/dL — ABNORMAL HIGH (ref 70–99)
HCT: 50 % — ABNORMAL HIGH (ref 36.0–46.0)
Hemoglobin: 17 g/dL — ABNORMAL HIGH (ref 12.0–15.0)
Potassium: 5.8 mmol/L — ABNORMAL HIGH (ref 3.5–5.1)
Sodium: 134 mmol/L — ABNORMAL LOW (ref 135–145)
TCO2: 21 mmol/L — ABNORMAL LOW (ref 22–32)

## 2024-02-19 MED ORDER — HEPARIN (PORCINE) IN NACL 1000-0.9 UT/500ML-% IV SOLN
INTRAVENOUS | Status: DC | PRN
Start: 2024-02-19 — End: 2024-02-19
  Administered 2024-02-19: 500 mL

## 2024-02-19 MED ORDER — LIDOCAINE HCL (PF) 1 % IJ SOLN
INTRAMUSCULAR | Status: DC | PRN
Start: 2024-02-19 — End: 2024-02-19
  Administered 2024-02-19: 20 mL

## 2024-02-19 MED ORDER — LIDOCAINE HCL (PF) 1 % IJ SOLN
INTRAMUSCULAR | Status: AC
  Filled 2024-02-19: qty 30

## 2024-02-19 MED ORDER — HEPARIN SODIUM (PORCINE) 1000 UNIT/ML IJ SOLN
INTRAMUSCULAR | Status: AC
  Filled 2024-02-19: qty 10

## 2024-02-19 MED ORDER — HEPARIN SODIUM (PORCINE) 1000 UNIT/ML IJ SOLN
INTRAMUSCULAR | Status: DC | PRN
Start: 2024-02-19 — End: 2024-02-19
  Administered 2024-02-19: 4200 [IU] via INTRAVENOUS

## 2024-02-19 NOTE — H&P (Signed)
 H&P    History of Present Illness: This is a 48 y.o. female with end-stage renal disease that presents for dialysis catheter exchange.  Currently using a left IJ TDC.  She states this has not been working for the last several weeks and was previously exchanged about 3 weeks ago.  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)    Past Surgical History:  Procedure Laterality Date   A/V FISTULAGRAM Left 02/10/2023   Procedure: A/V Fistulagram;  Surgeon: Adine Hoof, MD;  Location: [facility] INVASIVE CV LAB;  Service: Cardiovascular;  Laterality: Left;   AV FISTULA PLACEMENT Left 11/24/2015   Procedure: ARTERIOVENOUS (AV) FISTULA CREATION-LEFT UPPER ARM;  Surgeon: Mayo Speck, MD;  Location: [facility] OR;  Service: Vascular;  Laterality: Left;   AV FISTULA PLACEMENT Left 01/15/2016   Procedure: Embolectomy of Left Arm Fistula with revision of Brachiocephalic fistula and vein patch angioplasty;  Surgeon: Mayo Speck, MD;  Location: [facility] OR;  Service: Vascular;  Laterality: Left;   AV FISTULA PLACEMENT Right 01/17/2016   Procedure: Right Arm Brachial ARTERIOVENOUS FISTULA CREATION ;  Surgeon: Mayo Speck, MD;  Location: [facility] OR;  Service: Vascular;  Laterality: Right;   CESAREAN SECTION     DIALYSIS/PERMA CATHETER INSERTION  09/19/2023   Procedure: DIALYSIS/PERMA CATHETER INSERTION;  Surgeon: Patrick Boor, MD;  Location: [facility] INVASIVE CV LAB;  Service: Cardiovascular;;   INSERTION OF DIALYSIS CATHETER Right 01/08/2016   Procedure: INSERTION OF DIALYSIS CATHETER L;  Surgeon: Mayo Speck, MD;  Location[facility] OR;  Service: Vascular;  Laterality: Right;   PERIPHERAL VASCULAR BALLOON ANGIOPLASTY  02/10/2023   Procedure: PERIPHERAL VASCULAR BALLOON ANGIOPLASTY;  Surgeon: Adine Hoof,  MD;  Location: [facility] INVASIVE CV LAB;  Service: Cardiovascular;;  L AV fistula   RENAL BIOPSY     TUNNELLED CATHETER EXCHANGE N/A 12/25/2023   Procedure: TUNNELLED CATHETER EXCHANGE;  Surgeon: Patrick Boor, MD;  Location: [facility] INVASIVE CV LAB;  Service: Cardiovascular;  Laterality: N/A;   VENOUS ANGIOPLASTY Left 12/25/2023   Procedure: VENOUS ANGIOPLASTY;  Surgeon: Patrick Boor, MD;  Location: [facility] INVASIVE CV LAB;  Service: Cardiovascular;  Laterality: Left;  innominate    Allergies  Allergen Reactions   Lisinopril Cough    Prior to Admission medications   Medication Sig Start Date End Date Taking? Authorizing Provider  AURYXIA 1 GM 210 MG(Fe) tablet Take 420 mg by mouth 3 (three) times daily with meals. 10/31/23   [provider]  cyclobenzaprine  (FLEXERIL ) 10 MG tablet Take 10 mg by mouth at bedtime. 08/09/22   [provider]  diclofenac  Sodium (VOLTAREN ) 1 % GEL Apply 4 g topically 4 (four) times daily. 01/08/24   Gomez-Caraballo, Maria, MD  Dulaglutide (TRULICITY) 0.75 MG/0.5ML SOAJ Inject 0.75 mg into the skin once a week. 12/04/23   Masters, Katie, DO  lidocaine  (HM LIDOCAINE  PATCH) 4 % Place 2 patches onto the skin daily. Apply patch to the most painful area for up to 12 hours in a 24 hour period Patient not taking: Reported on 12/24/2023 12/16/23   Maxie Spaniel, MD  midodrine  (PROAMATINE ) 10 MG tablet Take 10 mg by mouth daily as needed (Low blood pressure). Mon., Wed., Fri. 07/09/22   [provider]  nicotine  (  NICODERM CQ  - DOSED IN MG/24 HOURS) 14 mg/24hr patch Place 1 patch (14 mg total) onto the skin daily. Patient not taking: Reported on 12/24/2023 12/16/23   Maxie Spaniel, MD  nicotine  polacrilex (NICORETTE ) 2 MG gum Take 1 each (2 mg total) by mouth as needed for smoking cessation. Place 1 lozenge between cheek and gum every 30 minutes for smoking urges. Max 10 per day. Patient not taking: Reported on 12/24/2023 12/16/23   Maxie Spaniel, MD  Oxycodone  HCl 10 MG TABS  Take 1 tablet (10 mg total) by mouth every 8 (eight) hours as needed for up to 25 days. 02/06/24 03/02/24  Cathey Clunes, MD  VELTASSA  8.4 g packet Take 1 packet by mouth daily as needed (for hyperkalemia). 08/08/22   Hongalgi, Thomasene Flemings, MD    Social History   Socioeconomic History   Marital status: Single    Spouse name: Not on file   Number of children: Not on file   Years of education: Not on file   Highest education level: Not on file  Occupational History   Not on file  Tobacco Use   Smoking status: Some Days    Current packs/day: 0.00    Types: Cigarettes    Last attempt to quit: 12/23/2015    Years since quitting: 8.1   Smokeless tobacco: Never   Tobacco comments:    2-3 cigs  Vaping Use   Vaping status: Every Day  Substance and Sexual Activity   Alcohol use: No    Alcohol/week: 0.0 standard drinks of alcohol   Drug use: No   Sexual activity: Not on file  Other Topics Concern   Not on file  Social History Narrative   Not on file   Social Drivers of Health   Financial Resource Strain: High Risk (12/31/2023)   Received from [facility]   Overall Financial Resource Strain (CARDIA)    Difficulty of Paying Living Expenses: Very hard  Food Insecurity: Food Insecurity Present (12/31/2023)   Received from [facility]   Hunger Vital Sign    Worried About Running Out of Food in the Last Year: Often true    Ran Out of Food in the Last Year: Often true  Transportation Needs: Unmet Transportation Needs (12/31/2023)   Received from [facility] - Transportation    In the past 12 months, has lack of transportation kept you from medical appointments or from getting medications?: Yes    Lack of Transportation (Non-Medical): Yes  Physical Activity: Inactive (11/18/2023)   Exercise Vital Sign    Days of Exercise per Week: 0 days    Minutes of Exercise per Session: 0 min  Stress: No Stress Concern Present  (11/18/2023)   [employer] of Occupational Health - Occupational Stress Questionnaire    Feeling of Stress : Only a little  Social Connections: Moderately Integrated (11/18/2023)   Social Connection and Isolation Panel [NHANES]    Frequency of Communication with Friends and Family: Twice a week    Frequency of Social Gatherings with Friends and Family: Twice a week    Attends Religious Services: 1 to 4 times per year    Active Member of [employer] or Organizations: No    Attends Banker Meetings: Never    Marital Status: Married  Catering manager Violence: Not At Risk (11/18/2023)   Humiliation, Afraid, Rape, and Kick questionnaire    Fear of Current or Ex-Partner: No    Emotionally Abused:  No    Physically Abused: No    Sexually Abused: No     Family History  Problem Relation Age of Onset   Diabetes Father    Colon cancer Neg Hx    Colon polyps Neg Hx    Esophageal cancer Neg Hx    Rectal cancer Neg Hx    Stomach cancer Neg Hx     ROS: [x]  Positive   [ ]  Negative   [ ]  All sytems reviewed and are negative  Cardiovascular: []  chest pain/pressure []  palpitations []  SOB lying flat []  DOE []  pain in legs while walking []  pain in legs at rest []  pain in legs at night []  non-healing ulcers []  hx of DVT []  swelling in legs  Pulmonary: []  productive cough []  asthma/wheezing []  home O2  Neurologic: []  weakness in []  arms []  legs []  numbness in []  arms []  legs []  hx of CVA []  mini stroke [] difficulty speaking or slurred speech []  temporary loss of vision in one eye []  dizziness  Hematologic: []  hx of cancer []  bleeding problems []  problems with blood clotting easily  Endocrine:   []  diabetes []  thyroid disease  GI []  vomiting blood []  blood in stool  GU: []  CKD/renal failure []  HD--[]  M/W/F or []  T/T/S []  burning with urination []  blood in urine  Psychiatric: []  anxiety []  depression  Musculoskeletal: []  arthritis []  joint  pain  Integumentary: []  rashes []  ulcers  Constitutional: []  fever []  chills   Physical Examination  There were no vitals filed for this visit. There is no height or weight on file to calculate BMI.  General:  WDWN in NAD Gait: Not observed HENT: WNL, normocephalic Pulmonary: normal non-labored breathing Cardiac: regular, without  Murmurs, rubs or gallops Abdomen:  soft, NT/ND, no masses Vascular Exam/Pulses: LIJ TDC  CBC    Component Value Date/Time   WBC 8.8 12/01/2023 2229   RBC 6.09 (H) 12/01/2023 2229   HGB 15.8 (H) 12/01/2023 2229   HCT 54.2 (H) 12/01/2023 2229   PLT 262 12/01/2023 2229   MCV 89.0 12/01/2023 2229   MCH 25.9 (L) 12/01/2023 2229   MCHC 29.2 (L) 12/01/2023 2229   RDW 20.6 (H) 12/01/2023 2229   LYMPHSABS 1.1 09/30/2022 1649   MONOABS 0.6 09/30/2022 1649   EOSABS 0.0 09/30/2022 1649   BASOSABS 0.1 09/30/2022 1649    BMET    Component Value Date/Time   NA 138 12/16/2023 1137   K 4.7 12/16/2023 1137   CL 92 (L) 12/16/2023 1137   CO2 18 (L) 12/16/2023 1137   GLUCOSE 83 12/16/2023 1137   GLUCOSE 92 12/01/2023 2229   BUN 41 (H) 12/16/2023 1137   CREATININE 11.73 (H) 12/16/2023 1137   CALCIUM  8.2 (L) 12/16/2023 1137   GFRNONAA 4 (L) 12/01/2023 2229   GFRAA 8 (L) 01/22/2016 0523    COAGS: Lab Results  Component Value Date   INR 0.89 01/17/2016   INR 0.94 01/15/2016   INR 0.96 01/08/2016     Non-Invasive Vascular Imaging:    N/A  ASSESSMENT/PLAN: This is a 48 y.o. female with end-stage renal disease that presents for dialysis catheter exchange.  Currently using a left IJ TDC.  She states this has not been working for the last several weeks and was previously exchanged about 3 weeks ago.  Discussed plan for looking at the catheter under fluoroscopy with catheter exchange at the request of dialysis  Young Hensen, MD [facility]: [phone]  Young Hensen

## 2024-02-19 NOTE — Op Note (Signed)
    Patient name: Dominique Ressel MRN: [id number] DOB: [birth year] Sex: female  02/19/2024 Pre-operative Diagnosis: Malfunctioning left IJ tunneled dialysis catheter Post-operative diagnosis:  Same Surgeon:  Young Hensen, MD Procedure Performed: 1.  Exchange of left IJ tunneled dialysis catheter (28 cm palindrome)  Indications: 48 year old female with end-stage renal disease that presents for exchange of her dialysis catheter.  This has been poorly functioning for the last several weeks.  She presents after risks-benefits discussed.  Findings: I used a stiff Glidewire to exchange for a new 28 cm palindrome catheter on the left chest wall through the IJ.  I placed the tip more shallow in the right atrium for a new position.  This flushed and aspirated easily.   Procedure:  The patient was identified in the holding area and taken to [facility] lab.  Placed on the table in supine position.  The existing catheter in the left chest wall was then prepped and draped in standard sterile fashion.  Timeout was performed.  I went and used about 20 cc of 1% lidocaine  without epinephrine  to anesthetize the existing catheter in her left chest wall.  I then used a stiff Glidewire that was placed down the catheter.  This appeared to be in good position with no kinks.  I then removed her old catheter without any resistance.  This was passed off the field.  An 28 cm palindrome placed over the stiff Glidewire with the tip into the right atrium.  I did place the catheter more shallow in the right atrium for a new position to hope this will help function of the catheter.  This was secured with a nylon suture.  It was loaded with heparin  according to manufactures recommendations.  Taken to holding in stable condition.       Young Hensen, MD [facility] [place] Office: [phone]

## 2024-02-20 ENCOUNTER — Encounter (HOSPITAL_COMMUNITY): Payer: Self-pay | Admitting: Vascular Surgery

## 2024-02-26 MED FILL — Lidocaine HCl Local Preservative Free (PF) Inj 1%: INTRAMUSCULAR | Qty: 30 | Status: AC

## 2024-03-30 NOTE — Progress Notes (Unsigned)
 Patient name: Sandy [name] Date of birth: 03-08-1976 Date of visit: 03/31/24  Type of visit: Established Patient Office Visit   Subjective   Chief concern:  Chief Complaint  Patient presents with   Medication Refill   Pain    Sandy [name] is a 48 y.o. 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  The patient had a graft procedure done to her left arm and is complaining of increased pain from her baseline chronic back pain. She states that she finished her last oxycodone  pill today and is requesting additional pain medication.   Follow up Hospitalization  Patient was admitted to [facility] on 03/24/24 and discharged on 03/26/24. She was treated for hyperkalemia, left AVG replacement by vascular surgery, completed hemodialysis session. Treatment for this included AVG revision + hemodialysis. ----------------------------------------------------------------------------------------- -    Patient Active Problem List   Diagnosis Date Noted   Acute post-operative pain 03/31/2024   Tobacco use disorder 12/16/2023   Left foot pain 12/16/2023   Elevated LFTs 12/16/2023   Xerosis of skin 06/05/2023   Obesity (BMI 30-39.9) 01/02/2023   Elevated alkaline phosphatase level 11/28/2022   Chronic pain syndrome 08/29/2022   Health care maintenance 08/29/2022   ESRD on dialysis [facility]) 08/05/2022     Past Surgical History:  Procedure Laterality Date   A/V FISTULAGRAM Left 02/10/2023   Procedure: A/V Fistulagram;  Surgeon: Sheree Penne Bruckner, MD;  Location: [facility] INVASIVE CV LAB;  Service: Cardiovascular;  Laterality: Left;   AV FISTULA PLACEMENT Left 11/24/2015   Procedure: ARTERIOVENOUS (AV) FISTULA CREATION-LEFT UPPER ARM;  Surgeon: Krystal JULIANNA Doing, MD;  Location: [facility] OR;  Service: Vascular;   Laterality: Left;   AV FISTULA PLACEMENT Left 01/15/2016   Procedure: Embolectomy of Left Arm Fistula with revision of Brachiocephalic fistula and vein patch angioplasty;  Surgeon: Krystal JULIANNA Doing, MD;  Location: [facility] OR;  Service: Vascular;  Laterality: Left;   AV FISTULA PLACEMENT Right 01/17/2016   Procedure: Right Arm Brachial ARTERIOVENOUS FISTULA CREATION ;  Surgeon: Krystal JULIANNA Doing, MD;  Location: [facility] OR;  Service: Vascular;  Laterality: Right;   CESAREAN SECTION     DIALYSIS/PERMA CATHETER INSERTION  09/19/2023   Procedure: DIALYSIS/PERMA CATHETER INSERTION;  Surgeon: Melia Lynwood ORN, MD;  Location: [facility] INVASIVE CV LAB;  Service: Cardiovascular;;   INSERTION OF DIALYSIS CATHETER Right 01/08/2016   Procedure: INSERTION OF DIALYSIS CATHETER L;  Surgeon: Krystal JULIANNA Doing, MD;  Location: [facility] OR;  Service: Vascular;  Laterality: Right;   PERIPHERAL VASCULAR BALLOON ANGIOPLASTY  02/10/2023   Procedure: PERIPHERAL VASCULAR BALLOON ANGIOPLASTY;  Surgeon: Sheree Penne Bruckner, MD;  Location: [facility] INVASIVE CV LAB;  Service: Cardiovascular;;  L AV fistula   RENAL BIOPSY     TUNNELLED CATHETER EXCHANGE N/A 12/25/2023   Procedure: TUNNELLED CATHETER EXCHANGE;  Surgeon: Melia Lynwood ORN, MD;  Location: [facility] INVASIVE CV LAB;  Service: Cardiovascular;  Laterality: N/A;   TUNNELLED CATHETER EXCHANGE N/A 02/19/2024   Procedure: TUNNELLED CATHETER EXCHANGE;  Surgeon: Gretta Bruckner PARAS, MD;  Location: [facility] LAB;  Service: Cardiovascular;  Laterality: N/A;   VENOUS ANGIOPLASTY Left 12/25/2023   Procedure: VENOUS ANGIOPLASTY;  Surgeon: Melia Lynwood ORN, MD;  Location: [facility] INVASIVE CV LAB;  Service: Cardiovascular;  Laterality: Left;  innominate    Review of Systems  Constitutional:  Negative for  chills and fever.  Respiratory:  Negative for cough and shortness of breath.   Cardiovascular:  Negative for chest pain.  Gastrointestinal:  Negative for abdominal pain, constipation and diarrhea.  Musculoskeletal:  Positive for back pain.   Psychiatric/Behavioral:  Negative for substance abuse.     Current Outpatient Medications  Medication Instructions   Auryxia 420 mg, Oral, 3 times daily with meals   cyclobenzaprine  (FLEXERIL ) 10 mg, Oral, Daily at bedtime   diclofenac  Sodium (VOLTAREN ) 4 g, Topical, 4 times daily   lidocaine  (HM LIDOCAINE  PATCH) 4 % 2 patches, Transdermal, Every 24 hours, Apply patch to the most painful area for up to 12 hours in a 24 hour period   midodrine  (PROAMATINE ) 10 mg, Daily PRN   Oxycodone  HCl 10 MG TABS Take 1 every 5 hours as needed for acute post operative pain.   [START ON 04/07/2024] Oxycodone  HCl 10 mg, Oral, 3 times daily PRN   Trulicity  0.75 mg, Subcutaneous, Weekly   VELTASSA  8.4 g packet 1 packet, Oral, Daily PRN    Social History   Tobacco Use   Smoking status: Some Days    Current packs/day: 0.00    Types: Cigarettes    Last attempt to quit: 12/23/2015    Years since quitting: 8.2   Smokeless tobacco: Never   Tobacco comments:    2-3 cigs  Vaping Use   Vaping status: Every Day  Substance Use Topics   Alcohol use: No    Alcohol/week: 0.0 standard drinks of alcohol   Drug use: No      Objective  Today's Vitals   03/31/24 1408  BP: 101/63  Pulse: (!) 110  Temp: 98.1 F (36.7 C)  TempSrc: Oral  SpO2: 95%  Weight: 187 lb 12.8 oz (85.2 kg)  Height: 5' 6 (1.676 m)  PainSc: 10-Worst pain ever  PainLoc: Arm  Body mass index is 30.31 kg/m.   Physical Exam Vitals reviewed.  Constitutional:      General: She is not in acute distress.    Appearance: Normal appearance. She is obese. She is not ill-appearing, toxic-appearing or diaphoretic.  HENT:     Head: Normocephalic and atraumatic.     Mouth/Throat:     Mouth: Mucous membranes are moist.     Pharynx: Oropharynx is clear.  Eyes:     General: No scleral icterus.       Right eye: No discharge.        Left eye: No discharge.     Extraocular Movements: Extraocular movements intact.     Conjunctiva/sclera:  Conjunctivae normal.     Pupils: Pupils are equal, round, and reactive to light.  Cardiovascular:     Rate and Rhythm: Normal rate and regular rhythm.     Pulses: Normal pulses.     Heart sounds: Normal heart sounds. No murmur heard.    No friction rub. No gallop.  Pulmonary:     Effort: Pulmonary effort is normal. No respiratory distress.     Breath sounds: Normal breath sounds. No stridor. No wheezing, rhonchi or rales.  Musculoskeletal:        General: Swelling (LUE where graft was placed) present. No tenderness, deformity or signs of injury.     Right lower leg: No edema.     Left lower leg: No edema.  Skin:    General: Skin is warm and dry.     Capillary Refill: Capillary refill takes less than 2 seconds.     Coloration: Skin is not  jaundiced or pale.     Findings: Bruising (prominent dark red/purple ecchymoses over procedure site on LUE; no erythema, purulent drainage noted) present. No erythema, lesion or rash.  Neurological:     General: No focal deficit present.     Mental Status: She is alert and oriented to person, place, and time. Mental status is at baseline.     Motor: No weakness.     Gait: Gait normal.  Psychiatric:        Mood and Affect: Mood normal.        Behavior: Behavior normal.        Assessment & Plan  Problem List Items Addressed This Visit       Genitourinary   ESRD on dialysis [facility])     Other   Chronic pain syndrome   Pain regimen is oxycodone  10mg  tid prn. Because of her recent graft revision to her left arm, she has had increased pain from baseline. She has been taking her oxycodone  every 4-6 hours. Cause of her chronic pain syndrome was a slip and fall accident in 2018. Has been following with [facility] for her pain since then. Has filled out pain contracts with our office previously. -For her chronic pain: oxycodone  10mg  tid prn. Will prescribe for 30 days starting on 04/07/24 (after her acute pain prescription runs out) until she can see Dr.  Elnora.      Elevated LFTs   Today she is asymptomatic. Tylenol  should still be avoided in her pain regimen due to frequent medication use.      Acute post-operative pain - Primary   Patient had a graft revision to her left arm at [facility] on 03/25/24. She has significant swelling, tenderness, and pain that is increasing her pain control requirements. She was not prescribed any additional pain medications upon discharge from [facility] because the [facility] is managing her chronic pain. Previously taking her original oxycodone  10mg  tid while inpatient. Plan:  -10mg  oxycodone  every 5 hours for 7 days for ACUTE pain associated with her surgery. -emphasized importance of alternating icing and heating to the affected areas.       Return in about 4 weeks (around 04/28/2024) for chronic pain medication management.  Patient discussed with Dr. Trudy, who also saw and evaluated the patient.  Sharron Simpson, MD [place] [facility]  PGY-1 03/31/2024, 5:12 PM

## 2024-03-31 ENCOUNTER — Other Ambulatory Visit: Payer: Self-pay | Admitting: Internal Medicine

## 2024-03-31 ENCOUNTER — Ambulatory Visit

## 2024-03-31 VITALS — BP 101/63 | HR 110 | Temp 98.1°F | Ht 66.0 in | Wt 187.8 lb

## 2024-03-31 DIAGNOSIS — G894 Chronic pain syndrome: Secondary | ICD-10-CM

## 2024-03-31 DIAGNOSIS — Z992 Dependence on renal dialysis: Secondary | ICD-10-CM | POA: Diagnosis not present

## 2024-03-31 DIAGNOSIS — G8918 Other acute postprocedural pain: Secondary | ICD-10-CM

## 2024-03-31 DIAGNOSIS — R7989 Other specified abnormal findings of blood chemistry: Secondary | ICD-10-CM | POA: Diagnosis not present

## 2024-03-31 DIAGNOSIS — N186 End stage renal disease: Secondary | ICD-10-CM | POA: Diagnosis not present

## 2024-03-31 MED ORDER — OXYCODONE HCL 10 MG PO TABS
10.0000 mg | ORAL_TABLET | Freq: Three times a day (TID) | ORAL | 0 refills | Status: DC | PRN
Start: 2024-04-07 — End: 2024-05-06

## 2024-03-31 MED ORDER — OXYCODONE HCL 10 MG PO TABS: ORAL_TABLET | ORAL | 0 refills | Status: DC

## 2024-03-31 NOTE — Assessment & Plan Note (Addendum)
 Patient had a graft revision to her left arm at [facility] on 03/25/24. She has significant swelling, tenderness, and pain that is increasing her pain control requirements. She was not prescribed any additional pain medications upon discharge from [facility] because the [facility] is managing her chronic pain. Previously taking her original oxycodone  10mg  tid while inpatient. Plan:  -10mg  oxycodone  every 5 hours for 7 days for ACUTE pain associated with her surgery. -emphasized importance of alternating icing and heating to the affected areas.

## 2024-03-31 NOTE — Assessment & Plan Note (Addendum)
 Pain regimen is oxycodone  10mg  tid prn. Because of her recent graft revision to her left arm, she has had increased pain from baseline. She has been taking her oxycodone  every 4-6 hours. Cause of her chronic pain syndrome was a slip and fall accident in 2018. Has been following with [facility] for her pain since then. Has filled out pain contracts with our office previously. -For her chronic pain: oxycodone  10mg  tid prn. Will prescribe for 30 days starting on 04/07/24 (after her acute pain prescription runs out) until she can see Dr. Elnora.

## 2024-03-31 NOTE — Patient Instructions (Addendum)
 Thank you, Sandy [name] for allowing us  to provide your care today. Today we discussed the following:  - Your acute pain from your procedure should improve within a week. We are prescribing you oxycodone  10mg  every 5 hours as needed for 1 week to help with this pain. - For your chronic pain, we are sending a new prescription for oxycodone  10mg  3 times per day as needed for 30 day supply. -  We will have you return to clinic and see Dr. Elnora as she understands your case better.  I have ordered the following labs for you:  Lab Orders  No laboratory test(s) ordered today     Tests ordered today:    Referrals ordered today:   Referral Orders  No referral(s) requested today     I have ordered the following medication/changed the following medications:   Stop the following medications: There are no discontinued medications.   Start the following medications: No orders of the defined types were placed in this encounter.    Follow up: When Dr. Kristy is available. The clinic will reach out to you for scheduling.    Remember:   Should you have any questions or concerns please call the Internal Medicine Clinic at [phone].     Sandy Lang, MD [facility]

## 2024-03-31 NOTE — Assessment & Plan Note (Addendum)
 Today she is asymptomatic. Tylenol  should still be avoided in her pain regimen due to frequent medication use.

## 2024-04-14 ENCOUNTER — Other Ambulatory Visit: Payer: Self-pay | Admitting: Medical Genetics

## 2024-04-15 NOTE — Progress Notes (Signed)
Internal Medicine Clinic Attending  I was physically present during the key portions of the resident provided service and participated in the medical decision making of patient's management care. I reviewed pertinent patient test results.  The assessment, diagnosis, and plan were formulated together and I agree with the documentation in the resident's note.  Williams, Julie Anne, MD  

## 2024-04-28 ENCOUNTER — Telehealth (HOSPITAL_COMMUNITY): Payer: Self-pay

## 2024-04-28 ENCOUNTER — Other Ambulatory Visit (HOSPITAL_COMMUNITY): Payer: Self-pay | Admitting: Nephrology

## 2024-04-28 DIAGNOSIS — N186 End stage renal disease: Secondary | ICD-10-CM

## 2024-04-28 NOTE — Telephone Encounter (Signed)
 Called to schedule TDC removal, no answer, left vm. AB

## 2024-05-06 ENCOUNTER — Ambulatory Visit (HOSPITAL_COMMUNITY)
Admission: RE | Admit: 2024-05-06 | Discharge: 2024-05-06 | Disposition: A | Discharge status: Home / Self Care | Source: Ambulatory Visit | Attending: Nephrology | Admitting: Nephrology

## 2024-05-06 ENCOUNTER — Encounter: Payer: Self-pay | Admitting: Student

## 2024-05-06 DIAGNOSIS — G894 Chronic pain syndrome: Secondary | ICD-10-CM

## 2024-05-06 DIAGNOSIS — Z992 Dependence on renal dialysis: Secondary | ICD-10-CM | POA: Insufficient documentation

## 2024-05-06 DIAGNOSIS — N186 End stage renal disease: Secondary | ICD-10-CM | POA: Diagnosis present

## 2024-05-06 HISTORY — PX: IR REMOVAL TUN CV CATH W/O FL: IMG2289

## 2024-05-06 MED ORDER — LIDOCAINE-EPINEPHRINE 1 %-1:100000 IJ SOLN
20.0000 mL | Freq: Once | INTRAMUSCULAR | Status: AC
  Administered 2024-05-06: 20 mL via INTRADERMAL

## 2024-05-06 MED ORDER — LIDOCAINE-EPINEPHRINE 1 %-1:100000 IJ SOLN
INTRAMUSCULAR | Status: AC
  Filled 2024-05-06: qty 1

## 2024-05-06 MED ORDER — OXYCODONE HCL 10 MG PO TABS: 10.0000 mg | ORAL_TABLET | Freq: Three times a day (TID) | ORAL | 0 refills | Status: DC | PRN

## 2024-05-06 NOTE — Procedures (Signed)
 Successful removal of left subclavian tunneled HD catheter.   After obtaining consent and performing a time-out, the left upper chest was prepped and draped in the normal sterile fashion. The heparin  was removed from both ports. 1% lidocaine  was used for local anesthesia. Using gentle blunt dissection the cuff of the catheter was exposed and the catheter was removed in its entirety. Pressure was held until hemostasis was obtained. A sterile dressing was applied. The patient tolerated the procedure well with no immediate complications. Please see full dictation under the imaging tab in Epic.   Alvira Hecht, AGACNP-BC 05/06/2024, 1:28 PM

## 2024-05-13 ENCOUNTER — Ambulatory Visit (HOSPITAL_COMMUNITY)
Admission: RE | Admit: 2024-05-13 | Discharge: 2024-05-13 | Disposition: A | Discharge status: Home / Self Care | Attending: Surgery | Admitting: Surgery

## 2024-05-13 ENCOUNTER — Encounter: Admitting: Student

## 2024-05-13 ENCOUNTER — Encounter (HOSPITAL_COMMUNITY)
Admission: RE | Disposition: A | Discharge status: Home / Self Care | Payer: Self-pay | Source: Home / Self Care | Attending: Surgery

## 2024-05-13 ENCOUNTER — Other Ambulatory Visit: Payer: Self-pay

## 2024-05-13 DIAGNOSIS — T82858A Stenosis of vascular prosthetic devices, implants and grafts, initial encounter: Secondary | ICD-10-CM

## 2024-05-13 DIAGNOSIS — Z992 Dependence on renal dialysis: Secondary | ICD-10-CM | POA: Insufficient documentation

## 2024-05-13 DIAGNOSIS — T82868A Thrombosis of vascular prosthetic devices, implants and grafts, initial encounter: Secondary | ICD-10-CM | POA: Insufficient documentation

## 2024-05-13 DIAGNOSIS — Y832 Surgical operation with anastomosis, bypass or graft as the cause of abnormal reaction of the patient, or of later complication, without mention of misadventure at the time of the procedure: Secondary | ICD-10-CM | POA: Diagnosis not present

## 2024-05-13 DIAGNOSIS — N186 End stage renal disease: Secondary | ICD-10-CM | POA: Diagnosis not present

## 2024-05-13 HISTORY — PX: PERIPHERAL VASCULAR THROMBECTOMY: CATH118306

## 2024-05-13 HISTORY — PX: VENOUS STENT: CATH118377

## 2024-05-13 HISTORY — PX: VENOUS ANGIOPLASTY: CATH118376

## 2024-05-13 SURGERY — PERIPHERAL VASCULAR THROMBECTOMY
Anesthesia: LOCAL | Laterality: Left

## 2024-05-13 MED ORDER — IODIXANOL 320 MG/ML IV SOLN
INTRAVENOUS | Status: DC | PRN
  Administered 2024-05-13: 20 mL via INTRAVENOUS

## 2024-05-13 MED ORDER — LIDOCAINE HCL (PF) 1 % IJ SOLN
INTRAMUSCULAR | Status: AC
Start: 2024-05-13 — End: 2024-05-13
  Filled 2024-05-13: qty 30

## 2024-05-13 MED ORDER — HEPARIN (PORCINE) IN NACL 1000-0.9 UT/500ML-% IV SOLN
INTRAVENOUS | Status: DC | PRN
  Administered 2024-05-13 (×2): 500 mL

## 2024-05-13 MED ORDER — FENTANYL CITRATE (PF) 100 MCG/2ML IJ SOLN
INTRAMUSCULAR | Status: AC
  Filled 2024-05-13: qty 2

## 2024-05-13 MED ORDER — HEPARIN SODIUM (PORCINE) 1000 UNIT/ML IJ SOLN
INTRAMUSCULAR | Status: AC
  Filled 2024-05-13: qty 10

## 2024-05-13 MED ORDER — MIDAZOLAM HCL 2 MG/2ML IJ SOLN
INTRAMUSCULAR | Status: DC | PRN
  Administered 2024-05-13 (×2): .5 mg via INTRAVENOUS

## 2024-05-13 MED ORDER — HEPARIN SODIUM (PORCINE) 1000 UNIT/ML IJ SOLN
INTRAMUSCULAR | Status: AC
Start: 2024-05-13 — End: 2024-05-13
  Filled 2024-05-13: qty 10

## 2024-05-13 MED ORDER — MIDAZOLAM HCL 2 MG/2ML IJ SOLN
INTRAMUSCULAR | Status: AC
  Filled 2024-05-13: qty 2

## 2024-05-13 MED ORDER — LIDOCAINE HCL (PF) 1 % IJ SOLN
INTRAMUSCULAR | Status: DC | PRN
  Administered 2024-05-13 (×2): 2 mL via SUBCUTANEOUS

## 2024-05-13 MED ORDER — FENTANYL CITRATE (PF) 100 MCG/2ML IJ SOLN
INTRAMUSCULAR | Status: DC | PRN
  Administered 2024-05-13 (×2): 25 ug via INTRAVENOUS

## 2024-05-13 MED ORDER — HEPARIN SODIUM (PORCINE) 1000 UNIT/ML IJ SOLN
INTRAMUSCULAR | Status: DC | PRN
  Administered 2024-05-13: 9000 [IU] via INTRAVENOUS
  Administered 2024-05-13: 3000 [IU] via INTRAVENOUS

## 2024-05-13 SURGICAL SUPPLY — 15 items
BALLOON FOGARTY 5FR 40 (CATHETERS) IMPLANT
BALLOON MUSTANG 7X60X75 (BALLOONS) IMPLANT
CATH ANGIO 5F BER2 65CM (CATHETERS) IMPLANT
CATH STR 7FR 55 BRITE (CATHETERS) IMPLANT
GLIDEWIRE ADV .035X180CM (WIRE) IMPLANT
KIT ENCORE 26 ADVANTAGE (KITS) IMPLANT
KIT MICROPUNCTURE NIT STIFF (SHEATH) IMPLANT
SHEATH PINNACLE 8F 10CM (SHEATH) IMPLANT
SHEATH PINNACLE R/O II 6F 4CM (SHEATH) IMPLANT
SHEATH PINNACLE R/O II 7F 4CM (SHEATH) IMPLANT
SHEATH PROBE COVER 6X72 (BAG) IMPLANT
STENT VIABAHN 120X8X7.5 (Permanent Stent) IMPLANT
STENT VIABAHN 8X100X75 (Permanent Stent) IMPLANT
TRAY PV CATH (CUSTOM PROCEDURE TRAY) ×2 IMPLANT
WIRE BENTSON .035X145CM (WIRE) IMPLANT

## 2024-05-13 NOTE — Op Note (Signed)
    Patient name: Sandy [name] MRN: [id number] DOB: July 17, 1976 Sex: female  05/13/2024 Pre-operative Diagnosis: Occluded left upper arm graft Post-operative diagnosis:  Same Surgeon:  Malvina New Procedure Performed:  1.  Ultrasound-guided access left upper arm graft x 2  2.  Shuntogram  3.  Mechanical and aspiration thrombectomy of left upper arm graft  4.  Stent, left upper arm graft  5.  Conscious sedation, 65 minutes    Indications: This is a 48 year old female who recently had a left upper arm loop graft placed at [facility].  This is now occluded.  She states that it worked well for 2 to 3 weeks  Procedure:  The patient was identified in the holding area and taken to room 8.  The patient was then placed supine on the table and prepped and draped in the usual sterile fashion.  A time out was called.  Conscious sedation was administered with the use of IV fentanyl  and Versed  under continuous physician and nurse monitoring.  Heart rate, blood pressure, and oxygen saturations were continuously monitored.  Total sedation time was 65 minutes ultrasound was used to evaluate the fistula.  The venous side of the graft was cannulated under ultrasound guidance with a micropuncture needle.  An 018 wire was inserted followed by placement of micropuncture sheath.  A Glidewire advantage was injected into the central venous system and a 7 French sheath was placed.  A Berenstein 2 catheter was then advanced through the graft into the outflow vein and a central venogram was performed which showed no central venous stenosis.  The patient was given 9000 heparin  and an additional 3000 units later in the case.  I then performed aspiration using a 7 French catheter.  4 passes were made.  Next, a 7 x 60 balloon was used to macerate the clot.  Next, access going towards the arterial and was acquired under ultrasound guidance with a micropuncture needle.  A 6 French sheath was placed.  A Glidewire advantage was advanced into  the arterial system.  I then used an over-the-wire Fogarty balloon to perform removal of the proximal plug.  I did this multiple times and was never really able to get persistent flow through the graft.  I repeated sweeping of the graft with a 7 mm balloon including treatment of the venous outflow side.  Still I was unable to get the graft open.  I then did some pullback injections that showed that there was residual thrombus at the venous outflow tract that was not responding to venoplasty.  I felt this needed to be stented and replaced an 8 x 10 Viabahn stent across the venous anastomosis into the graft.  Repeat venography showed persistent thrombus within the graft proximal to the stent and so I placed a another 8 x 7.5 Viabahn.  These were molded with a 7 mm balloon.  Final imaging showed restoration of flow through the graft.  Monocryl suture was used to close the access site.    Impression:  #1  Successful thrombectomy of left upper arm loop graft with stenting of the venous outflow tract using 8 mm Viabahn's  #2  Graft remains amenable to percutaneous intervention    V. Malvina New, M.D., [facility] [place] Office: [phone] Pager:  [phone]

## 2024-05-14 ENCOUNTER — Encounter (HOSPITAL_COMMUNITY): Payer: Self-pay | Admitting: Surgery

## 2024-05-15 NOTE — H&P (Signed)
   Patient name: Sandy [name] MRN: [id number] DOB: 10/03/1975 Sex: female  REASON FOR VISIT:     dialysis  HISTORY OF PRESENT ILLNESS:   This is a 48 year old female who recently had a left upper arm loop graft placed at [facility]. This is now occluded. She states that it worked well for 2 to 3 weeks   CURRENT MEDICATIONS:    Current Facility-Administered Medications  Medication Dose Route Frequency Provider Last Rate Last Admin   0.9 %  sodium chloride  infusion  250 mL Intravenous PRN Sheree Penne Bruckner, MD       sodium chloride  flush (NS) 0.9 % injection 3 mL  3 mL Intravenous Q12H Sheree Penne Bruckner, MD       Current Outpatient Medications  Medication Sig Dispense Refill   AURYXIA 1 GM 210 MG(Fe) tablet Take 420 mg by mouth 3 (three) times daily with meals.     cyclobenzaprine  (FLEXERIL ) 10 MG tablet Take 10 mg by mouth at bedtime.     diclofenac  Sodium (VOLTAREN ) 1 % GEL Apply 4 g topically 4 (four) times daily. 300 g 0   Dulaglutide  (TRULICITY ) 0.75 MG/0.5ML SOAJ Inject 0.75 mg into the skin once a week. 2 mL 3   lidocaine  (HM LIDOCAINE  PATCH) 4 % Place 2 patches onto the skin daily. Apply patch to the most painful area for up to 12 hours in a 24 hour period (Patient not taking: Reported on 12/24/2023) 60 patch 3   midodrine  (PROAMATINE ) 10 MG tablet Take 10 mg by mouth daily as needed (Low blood pressure). Mon., Wed., Fri.     Oxycodone  HCl 10 MG TABS Take 1 every 5 hours as needed for acute post operative pain. 35 tablet 0   Oxycodone  HCl 10 MG TABS Take 1 tablet (10 mg total) by mouth 3 (three) times daily as needed. 90 tablet 0   VELTASSA  8.4 g packet Take 1 packet by mouth daily as needed (for hyperkalemia).      REVIEW OF SYSTEMS:   [X]  denotes positive finding, [ ]  denotes negative finding Cardiac  Comments:  Chest pain or chest pressure:    Shortness of breath upon exertion:    Short of breath when lying flat:    Irregular  heart rhythm:    Constitutional    Fever or chills:      PHYSICAL EXAM:   Vitals:   05/13/24 1023 05/13/24 1025 05/13/24 1027 05/13/24 1037  BP: (!) 91/58 (!) 87/62 96/69 95/72   Pulse: 95 90 88 85  Resp: 13 12 15 12   Temp:      TempSrc:      SpO2: 99% 99% 99% 98%    GENERAL: The patient is a well-nourished female, in no acute distress. The vital signs are documented above. CARDIOVASCULAR: There is a regular rate and rhythm. PULMONARY: Non-labored respirations Occluded left upper arm graft  STUDIES:   none   MEDICAL ISSUES:   ESRD: we discussed attempts at thrombectomy and if not successful, placement of a TDC.  All questions answered  Malvina Serene CLORE, MD, FACS [facility] [phone] Pager [phone]

## 2024-05-17 ENCOUNTER — Telehealth: Payer: Self-pay | Admitting: *Deleted

## 2024-05-17 NOTE — Telephone Encounter (Signed)
 Return call to Sandy [name] -patient has an appointment for 8:15 AM on 05/20/2024. Copied from CRM [id number]. Topic: Appointments - Appointment Info/Confirmation >> May 17, 2024  4:09 PM Miquel SAILOR wrote: Patient/patient representative is calling for information regarding an appointment.   [place]  from transportation confirming app for PT [phone]

## 2024-05-20 ENCOUNTER — Ambulatory Visit: Admitting: Student

## 2024-05-20 ENCOUNTER — Encounter: Payer: Self-pay | Admitting: Student

## 2024-05-20 VITALS — BP 95/66 | HR 91 | Temp 98.1°F | Ht 66.0 in | Wt 197.0 lb

## 2024-05-20 DIAGNOSIS — F17210 Nicotine dependence, cigarettes, uncomplicated: Secondary | ICD-10-CM

## 2024-05-20 DIAGNOSIS — F17200 Nicotine dependence, unspecified, uncomplicated: Secondary | ICD-10-CM

## 2024-05-20 DIAGNOSIS — R748 Abnormal levels of other serum enzymes: Secondary | ICD-10-CM

## 2024-05-20 DIAGNOSIS — Z992 Dependence on renal dialysis: Secondary | ICD-10-CM | POA: Diagnosis not present

## 2024-05-20 DIAGNOSIS — Z716 Tobacco abuse counseling: Secondary | ICD-10-CM | POA: Diagnosis not present

## 2024-05-20 DIAGNOSIS — N186 End stage renal disease: Secondary | ICD-10-CM

## 2024-05-20 DIAGNOSIS — R7989 Other specified abnormal findings of blood chemistry: Secondary | ICD-10-CM

## 2024-05-20 DIAGNOSIS — Z0000 Encounter for general adult medical examination without abnormal findings: Secondary | ICD-10-CM

## 2024-05-20 DIAGNOSIS — Z124 Encounter for screening for malignant neoplasm of cervix: Secondary | ICD-10-CM

## 2024-05-20 DIAGNOSIS — G894 Chronic pain syndrome: Secondary | ICD-10-CM

## 2024-05-20 DIAGNOSIS — Z1211 Encounter for screening for malignant neoplasm of colon: Secondary | ICD-10-CM

## 2024-05-20 MED ORDER — CYCLOBENZAPRINE HCL 10 MG PO TABS: 10.0000 mg | ORAL_TABLET | Freq: Every day | ORAL | 3 refills | Status: DC

## 2024-05-20 MED ORDER — VARENICLINE TARTRATE 1 MG PO TABS: 0.5000 mg | ORAL_TABLET | Freq: Every day | ORAL | 0 refills | Status: AC

## 2024-05-20 MED ORDER — LIDOCAINE 4 % EX PTCH: 2.0000 | MEDICATED_PATCH | CUTANEOUS | 3 refills | Status: AC

## 2024-05-20 NOTE — Progress Notes (Signed)
   CC: Office visit, medication refill  HPI:  [name] is a 48 y.o. female with a PMH stated below who presents today for medication refill.  Originally she is appointment for evaluation of right knee pain after a fall at home 2 weeks ago.  Mechanical fall due to a trip.  Pain has mostly resolved at this point but she decided to keep the appointment anyway.  She has no other acute complaints.  We ended up talking about her smoking habits and she agreed that now would be a good time to try to quit.  Please see problem based assessment and plan for additional details.  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  Vitals:   05/20/24 0819  BP: 95/66  Pulse: 91  Temp: 98.1 F (36.7 C)  TempSrc: Oral  SpO2: 99%  Weight: 197 lb (89.4 kg)  Height: 5' 6 (1.676 m)   Physical Exam: Constitutional: well-appearing woman in no acute distress Cardiovascular: regular rate and rhythm, no m/r/g Pulmonary/Chest: normal work of breathing on room air, lungs clear to auscultation bilaterally MSK: normal bulk and tone. Neurological: alert & oriented x 3, no focal deficit Skin: warm and dry Psych: normal mood and behavior  Assessment & Plan:   Patient discussed with Dr. Francesco  Tobacco use disorder Current smoker.  Recently had an occluded left upper arm graft.  Smoking cessation would benefit her bili levels.  She is interested in attempting to quit now and utilizing a medicine for assist. - Chantix  0.5 mg weekly for 13 weeks.  Dose reduced given ESRD.  She will identify a quit date 1 week after starting the medicine.  Chronic pain syndrome Originally this was an acute visit for  acute right knee pain secondary to a fall in addition to her chronic pain.  However she says that her pain has resolved.  My examination reveals no concerns.  She request refills of her not narcotic pain medicines.  I will refill her Flexeril .    She was taking ibuprofen  for adjunctive therapy.  Note that she is ESRD.  With recent elevated LFTs favored that over acetaminophen .  LFTs appear to have improved.  Neither medicine is ideal for her in the setting.  We discussed this and at this point feel a reduced dose of Tylenol  is probably safest for her.  Can continue to trend LFTs as needed.  Health care maintenance Per patient request I will place referral to gastroenterology for screening colonoscopy.  She does not have a family history of colon cancer. I will also place referral to OB/GYN for cervical cancer screening at her request.  She does not have a family history of cervical cancer.  Elevated LFTs Normalized earlier this summer. Consider recheck at next blood draw.  ESRD on dialysis [facility]) Some elevation in phosphorus on recent review of labs.  Recommend an office visit with her nephrologist.  RTC in 3 months for follow up smoking cessation, chronic pain.  Lonni Africa, D.O. [facility], PGY-2 Phone: [phone] Date 05/20/2024 Time 9:48 AM

## 2024-05-20 NOTE — Assessment & Plan Note (Signed)
 Per patient request I will place referral to gastroenterology for screening colonoscopy.  She does not have a family history of colon cancer. I will also place referral to OB/GYN for cervical cancer screening at her request.  She does not have a family history of cervical cancer.

## 2024-05-20 NOTE — Progress Notes (Signed)
 Internal Medicine Clinic Attending  Case discussed with the resident at the time of the visit.  We reviewed the resident's history and exam and pertinent patient test results.  I agree with the assessment, diagnosis, and plan of care documented in the resident's note.

## 2024-05-20 NOTE — Assessment & Plan Note (Addendum)
 Some elevation in phosphorus on recent review of labs.  Recommend an office visit with her nephrologist.

## 2024-05-20 NOTE — Assessment & Plan Note (Signed)
 Current smoker.  Recently had an occluded left upper arm graft.  Smoking cessation would benefit her bili levels.  She is interested in attempting to quit now and utilizing a medicine for assist. - Chantix  0.5 mg weekly for 13 weeks.  Dose reduced given ESRD.  She will identify a quit date 1 week after starting the medicine.

## 2024-05-20 NOTE — Patient Instructions (Signed)
 Start Chantix  one week before desired quit date. One week into taking the medicine, stop smoking altogether You will take the medicine for 13 weeks  Expect phone calls to set up your colonoscopy and gynecology care.

## 2024-05-20 NOTE — Assessment & Plan Note (Signed)
 Originally this was an acute visit for acute right knee pain secondary to a fall in addition to her chronic pain.  However she says that her pain has resolved.  My examination reveals no concerns.  She request refills of her not narcotic pain medicines.  I will refill her Flexeril .    She was taking ibuprofen  for adjunctive therapy.  Note that she is ESRD.  With recent elevated LFTs favored that over acetaminophen .  LFTs appear to have improved.  Neither medicine is ideal for her in the setting.  We discussed this and at this point feel a reduced dose of Tylenol  is probably safest for her.  Can continue to trend LFTs as needed.

## 2024-05-20 NOTE — Assessment & Plan Note (Signed)
 Normalized earlier this summer. Consider recheck at next blood draw.

## 2024-05-21 ENCOUNTER — Other Ambulatory Visit: Payer: Self-pay

## 2024-05-21 ENCOUNTER — Ambulatory Visit (HOSPITAL_COMMUNITY)
Admission: RE | Admit: 2024-05-21 | Discharge: 2024-05-21 | Disposition: A | Discharge status: Home / Self Care | Source: Ambulatory Visit | Attending: Vascular Surgery | Admitting: Vascular Surgery

## 2024-05-21 ENCOUNTER — Encounter (HOSPITAL_COMMUNITY)
Admission: RE | Disposition: A | Discharge status: Home / Self Care | Payer: Self-pay | Source: Ambulatory Visit | Attending: Vascular Surgery

## 2024-05-21 DIAGNOSIS — F17290 Nicotine dependence, other tobacco product, uncomplicated: Secondary | ICD-10-CM | POA: Diagnosis not present

## 2024-05-21 DIAGNOSIS — T82858A Stenosis of vascular prosthetic devices, implants and grafts, initial encounter: Secondary | ICD-10-CM

## 2024-05-21 DIAGNOSIS — N186 End stage renal disease: Secondary | ICD-10-CM | POA: Insufficient documentation

## 2024-05-21 DIAGNOSIS — Z992 Dependence on renal dialysis: Secondary | ICD-10-CM | POA: Diagnosis not present

## 2024-05-21 DIAGNOSIS — Y832 Surgical operation with anastomosis, bypass or graft as the cause of abnormal reaction of the patient, or of later complication, without mention of misadventure at the time of the procedure: Secondary | ICD-10-CM | POA: Insufficient documentation

## 2024-05-21 DIAGNOSIS — I120 Hypertensive chronic kidney disease with stage 5 chronic kidney disease or end stage renal disease: Secondary | ICD-10-CM | POA: Insufficient documentation

## 2024-05-21 DIAGNOSIS — Z7985 Long-term (current) use of injectable non-insulin antidiabetic drugs: Secondary | ICD-10-CM | POA: Insufficient documentation

## 2024-05-21 DIAGNOSIS — T82868A Thrombosis of vascular prosthetic devices, implants and grafts, initial encounter: Secondary | ICD-10-CM | POA: Diagnosis present

## 2024-05-21 HISTORY — PX: PERIPHERAL VASCULAR THROMBECTOMY: CATH118306

## 2024-05-21 SURGERY — PERIPHERAL VASCULAR THROMBECTOMY
Anesthesia: LOCAL | Site: Arm Upper | Laterality: Left

## 2024-05-21 MED ORDER — HEPARIN SODIUM (PORCINE) 1000 UNIT/ML IJ SOLN
INTRAMUSCULAR | Status: DC | PRN
Start: 2024-05-21 — End: 2024-05-21
  Administered 2024-05-21: 10000 [IU] via INTRAVENOUS

## 2024-05-21 MED ORDER — MIDAZOLAM HCL 2 MG/2ML IJ SOLN
INTRAMUSCULAR | Status: DC | PRN
  Administered 2024-05-21 (×2): 1 mg via INTRAVENOUS

## 2024-05-21 MED ORDER — LIDOCAINE HCL (PF) 1 % IJ SOLN
INTRAMUSCULAR | Status: DC | PRN
  Administered 2024-05-21 (×2): 5 mL via INTRADERMAL

## 2024-05-21 MED ORDER — MIDAZOLAM HCL 2 MG/2ML IJ SOLN
INTRAMUSCULAR | Status: AC
Start: 2024-05-21 — End: 2024-05-21
  Filled 2024-05-21: qty 2

## 2024-05-21 MED ORDER — IODIXANOL 320 MG/ML IV SOLN
INTRAVENOUS | Status: DC | PRN
  Administered 2024-05-21: 35 mL

## 2024-05-21 MED ORDER — HEPARIN SODIUM (PORCINE) 1000 UNIT/ML IJ SOLN
INTRAMUSCULAR | Status: AC
  Filled 2024-05-21: qty 10

## 2024-05-21 MED ORDER — HEPARIN (PORCINE) IN NACL 1000-0.9 UT/500ML-% IV SOLN
INTRAVENOUS | Status: DC | PRN
  Administered 2024-05-21: 500 mL

## 2024-05-21 MED ORDER — FENTANYL CITRATE (PF) 100 MCG/2ML IJ SOLN
INTRAMUSCULAR | Status: DC | PRN
  Administered 2024-05-21 (×2): 25 ug via INTRAVENOUS

## 2024-05-21 MED ORDER — LIDOCAINE HCL (PF) 1 % IJ SOLN
INTRAMUSCULAR | Status: AC
  Filled 2024-05-21: qty 30

## 2024-05-21 MED ORDER — FENTANYL CITRATE (PF) 100 MCG/2ML IJ SOLN
INTRAMUSCULAR | Status: AC
  Filled 2024-05-21: qty 2

## 2024-05-21 SURGICAL SUPPLY — 11 items
BALLOON FOGARTY 5FR 40 (CATHETERS) IMPLANT
BALLOON MUSTANG 4.0X40 75 (BALLOONS) IMPLANT
BALLOON MUSTANG 8X80X75 (BALLOONS) IMPLANT
CATH MACH 1 ST 7FR 55 (CATHETERS) IMPLANT
KIT ENCORE 26 ADVANTAGE (KITS) IMPLANT
KIT MICROPUNCTURE NIT STIFF (SHEATH) IMPLANT
SHEATH PINNACLE R/O II 6F 4CM (SHEATH) IMPLANT
SHEATH PINNACLE R/O II 7F 4CM (SHEATH) IMPLANT
SHEATH PROBE COVER 6X72 (BAG) IMPLANT
TRAY PV CATH (CUSTOM PROCEDURE TRAY) ×1 IMPLANT
WIRE BENTSON .035X145CM (WIRE) IMPLANT

## 2024-05-21 NOTE — Op Note (Signed)
 Patient name: Sandy [name] MRN: [id number] DOB: 1976/05/23 Sex: female  05/21/2024 Pre-operative Diagnosis: End-stage renal disease, thrombosed left upper arm AV loop graft Post-operative diagnosis:  Same Surgeon:  Penne BROCKS. Sheree, MD Procedure Performed: 1.  Percutaneous ultrasound-guided cannulation left upper arm AV loop graft and antegrade and retrograde directions 2.  Central venogram with completion shuntogram 3.  Mechanical thrombectomy left upper extremity AV loop graft using Mach 1 guide catheter, 8 mm Mustang balloon and over-the-wire Fogarty catheter 4.  Plain balloon angioplasty of arterial to graft anastomosis with 4 mm balloon 5.  Moderate sedation with fentanyl  and Versed  for 20 minutes   Indications: 48 year old female with left upper extremity loop graft recently placed at [facility] and underwent mechanical thrombectomy at [facility] 8 days ago.  Yesterday she noted that the graft thrombosed again states that she was not given heparin  at dialysis.  We have discussed her options being catheter versus attempted thrombectomy and she wishes to proceed with thrombectomy.  All risk benefits and alternatives were discussed with the patient she demonstrates good understanding and agrees to proceed.  Findings: The graft itself had thrombus throughout up to the level of the stents which were patent.  Centrally her veins are patent.  There appeared to be an anastomotic stenosis from the artery to the graft.  This was ballooned with 4 mm balloon and a completion there was brisk flow through the graft.   Procedure:  The patient was identified in the holding area and taken to the [facility] procedure room.  The patient was then placed supine on the table and prepped and draped in the usual sterile fashion.  A time out was called.  Ultrasound was used to evaluate the graft in an antegrade direction.  This was noted to be thrombosed.  An ultrasound image was saved to the permanent record.  The  area was anesthetized 1% lidocaine  cannulated with a micropuncture needle followed by wire and sheath.  I then placed a Bentson wire under fluoroscopic guidance followed by a 7 French sheath centrally.  A Mach 1 catheter was then placed.  We confirmed intraluminal access centrally and that the central veins were patent.  We then administered fentanyl  and Versed  as moderate sedation her vital signs were monitored throughout the case.  We then performed aspiration thrombectomy with 2 passes of the catheter.  A Bentson wire was then placed centrally and the 7 French sheath was pulled back to the level of the skin and the dilator was placed through the sheath.  Attention was then turned towards the retrograde direction toward the arterial anastomosis.  Again the skin was anesthetized and ultrasound again was used to cannulate the graft retrograde.  An ultrasound images saved the permanent record.  We placed a micropuncture sheath followed by a Bentson wire followed by 6 French sheath.  We then used an over-the-wire Fogarty we confirmed intraluminal access by arteriogram.  We then performed multiple pullbacks over the wire with the Fogarty.  We did establish strong inflow there appeared to be stenosis at the level of the arterial to graft anastomosis when pulling the balloon back.  The balloon was then placed into the brachial artery and we performed angiography in multiple views but could not identify the stenosis.  We then performed over-the-wire plain balloon angioplasty with a 4 Mustang balloon at the level of the anastomosis.  Completion angiography from the arterial side demonstrated brisk flow through the anastomosis into the graft and filling centrally there  was somewhat more sluggish flow through the brachial artery down the arm of the arm.  Well-perfused.  Satisfied with this all the wires catheters were removed and we suture-ligated the sheath cannulation sites.  She tolerated the procedure without any  complication.  Contrast: 35cc   Jasier Calabretta C. Sheree, MD [facility] [place] Office: [phone] Pager: [phone]

## 2024-05-21 NOTE — H&P (Signed)
 H&P     History of Present Illness: This is a 48 y.o. female with end-stage renal disease that presents for declot of occluded left upper arm AV graft.  This was placed at [facility].  Just recently underwent declot on 05/13/2024 by Dr. Serene.  She states they forgot to give her heparin  yesterday and it clotted again yesterday.  She states TDC was just removed.  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)    Past Surgical History:  Procedure Laterality Date   A/V FISTULAGRAM Left 02/10/2023   Procedure: A/V Fistulagram;  Surgeon: Sheree Penne Bruckner, MD;  Location: [facility] INVASIVE CV LAB;  Service: Cardiovascular;  Laterality: Left;   AV FISTULA PLACEMENT Left 11/24/2015   Procedure: ARTERIOVENOUS (AV) FISTULA CREATION-LEFT UPPER ARM;  Surgeon: Krystal JULIANNA Doing, MD;  Location: [facility] OR;  Service: Vascular;  Laterality: Left;   AV FISTULA PLACEMENT Left 01/15/2016   Procedure: Embolectomy of Left Arm Fistula with revision of Brachiocephalic fistula and vein patch angioplasty;  Surgeon: Krystal JULIANNA Doing, MD;  Location: [facility] OR;  Service: Vascular;  Laterality: Left;   AV FISTULA PLACEMENT Right 01/17/2016   Procedure: Right Arm Brachial ARTERIOVENOUS FISTULA CREATION ;  Surgeon: Krystal JULIANNA Doing, MD;  Location: [facility] OR;  Service: Vascular;  Laterality: Right;   CESAREAN SECTION     DIALYSIS/PERMA CATHETER INSERTION  09/19/2023   Procedure: DIALYSIS/PERMA CATHETER INSERTION;  Surgeon: Melia Lynwood ORN, MD;  Location: [facility] INVASIVE CV LAB;  Service: Cardiovascular;;   INSERTION OF DIALYSIS CATHETER Right 01/08/2016   Procedure: INSERTION OF DIALYSIS CATHETER L;  Surgeon: Krystal JULIANNA Doing, MD;  Location: [facility] OR;  Service: Vascular;  Laterality: Right;   IR REMOVAL TUN CV CATH W/O FL  05/06/2024   PERIPHERAL VASCULAR  BALLOON ANGIOPLASTY  02/10/2023   Procedure: PERIPHERAL VASCULAR BALLOON ANGIOPLASTY;  Surgeon: Sheree Penne Bruckner, MD;  Location: [facility] INVASIVE CV LAB;  Service: Cardiovascular;;  L AV fistula   PERIPHERAL VASCULAR THROMBECTOMY Left 05/13/2024   Procedure: PERIPHERAL VASCULAR THROMBECTOMY;  Surgeon: Serene Gaile ORN, MD;  Location: [facility] PV LAB;  Service: Cardiovascular;  Laterality: Left;   RENAL BIOPSY     TUNNELLED CATHETER EXCHANGE N/A 12/25/2023   Procedure: TUNNELLED CATHETER EXCHANGE;  Surgeon: Melia Lynwood ORN, MD;  Location: [facility] INVASIVE CV LAB;  Service: Cardiovascular;  Laterality: N/A;   TUNNELLED CATHETER EXCHANGE N/A 02/19/2024   Procedure: TUNNELLED CATHETER EXCHANGE;  Surgeon: Gretta Bruckner PARAS, MD;  Location: [facility];  Service: Cardiovascular;  Laterality: N/A;   VENOUS ANGIOPLASTY Left 12/25/2023   Procedure: VENOUS ANGIOPLASTY;  Surgeon: Melia Lynwood ORN, MD;  Location: [facility] INVASIVE CV LAB;  Service: Cardiovascular;  Laterality: Left;  innominate   VENOUS ANGIOPLASTY  05/13/2024   Procedure: VENOUS ANGIOPLASTY;  Surgeon: Serene Gaile ORN, MD;  Location: [facility];  Service: Cardiovascular;;  Venous Anastomosis   VENOUS STENT  05/13/2024   Procedure: VENOUS STENT;  Surgeon: Serene Gaile ORN, MD;  Location: [facility] LAB;  Service: Cardiovascular;;  Venous Anastomosis    Allergies  Allergen Reactions   Lisinopril Cough    Prior to Admission medications   Medication Sig Start Date End Date Taking? Authorizing Provider  AURYXIA 1 GM 210 MG(Fe) tablet Take 420 mg by mouth 3 (  three) times daily with meals. 10/31/23   [provider]  cyclobenzaprine  (FLEXERIL ) 10 MG tablet Take 1 tablet (10 mg total) by mouth at bedtime. 05/20/24   Harrie Bruckner, DO  diclofenac  Sodium (VOLTAREN ) 1 % GEL Apply 4 g topically 4 (four) times daily. 01/08/24   Gomez-Caraballo, Maria, MD  Dulaglutide  (TRULICITY ) 0.75 MG/0.5ML SOAJ Inject 0.75 mg into the skin once a week. 12/04/23   Masters, Katie,  DO  lidocaine  (HM LIDOCAINE  PATCH) 4 % Place 2 patches onto the skin daily. Apply patch to the most painful area for up to 12 hours in a 24 hour period 05/20/24   Harrie Bruckner, DO  midodrine  (PROAMATINE ) 10 MG tablet Take 10 mg by mouth daily as needed (Low blood pressure). Mon., Wed., Fri. 07/09/22   [provider]  Oxycodone  HCl 10 MG TABS Take 1 every 5 hours as needed for acute post operative pain. 03/31/24   Trudy Mliss Dragon, MD  Oxycodone  HCl 10 MG TABS Take 1 tablet (10 mg total) by mouth 3 (three) times daily as needed. 05/06/24 06/05/24  Marylu Gee, DO  varenicline  (CHANTIX ) 1 MG tablet Take 0.5 tablets (0.5 mg total) by mouth daily. 05/20/24 08/19/24  Harrie Bruckner, DO  VELTASSA  8.4 g packet Take 1 packet by mouth daily as needed (for hyperkalemia). 08/08/22   Hongalgi, Trenda BIRCH, MD    Social History   Socioeconomic History   Marital status: Single    Spouse name: Not on file   Number of children: Not on file   Years of education: Not on file   Highest education level: Not on file  Occupational History   Not on file  Tobacco Use   Smoking status: Some Days    Current packs/day: 0.00    Types: Cigarettes    Last attempt to quit: 12/23/2015    Years since quitting: 8.4   Smokeless tobacco: Never   Tobacco comments:    2-3 cigs  Vaping Use   Vaping status: Every Day  Substance and Sexual Activity   Alcohol use: No    Alcohol/week: 0.0 standard drinks of alcohol   Drug use: No   Sexual activity: Not on file  Other Topics Concern   Not on file  Social History Narrative   Not on file   Social Drivers of Health   Financial Resource Strain: High Risk (03/24/2024)   Received from [facility]   Overall Financial Resource Strain (CARDIA)    Difficulty of Paying Living Expenses: Hard  Food Insecurity: Food Insecurity Present (03/24/2024)   Received from [facility]   Hunger Vital Sign    Within the past 12 months, you  worried that your food would run out before you got the money to buy more.: Sometimes true    Within the past 12 months, the food you bought just didn't last and you didn't have money to get more.: Sometimes true  Transportation Needs: No Transportation Needs (03/25/2024)   Received from [facility] - Transportation    In the past 12 months, has lack of transportation kept you from medical appointments or from getting medications?: No    Lack of Transportation (Non-Medical): No  Recent Concern: Transportation Needs - Unmet Transportation Needs (03/24/2024)   Received from [facility] - Transportation    In the past 12 months, has lack of transportation kept you from medical appointments or from getting medications?: No  Lack of Transportation (Non-Medical): Yes  Physical Activity: Inactive (11/18/2023)   Exercise Vital Sign    Days of Exercise per Week: 0 days    Minutes of Exercise per Session: 0 min  Stress: No Stress Concern Present (11/18/2023)   [employer] of Occupational Health - Occupational Stress Questionnaire    Feeling of Stress : Only a little  Social Connections: Moderately Integrated (11/18/2023)   Social Connection and Isolation Panel    Frequency of Communication with Friends and Family: Twice a week    Frequency of Social Gatherings with Friends and Family: Twice a week    Attends Religious Services: 1 to 4 times per year    Active Member of [employer] or Organizations: No    Attends Banker Meetings: Never    Marital Status: Married  Catering manager Violence: Not At Risk (11/18/2023)   Humiliation, Afraid, Rape, and Kick questionnaire    Fear of Current or Ex-Partner: No    Emotionally Abused: No    Physically Abused: No    Sexually Abused: No     Family History  Problem Relation Age of Onset   Diabetes Father    Colon cancer Neg Hx    Colon polyps Neg Hx    Esophageal cancer Neg Hx    Rectal  cancer Neg Hx    Stomach cancer Neg Hx     ROS: [x]  Positive   [ ]  Negative   [ ]  All sytems reviewed and are negative  Cardiovascular: []  chest pain/pressure []  palpitations []  SOB lying flat []  DOE []  pain in legs while walking []  pain in legs at rest []  pain in legs at night []  non-healing ulcers []  hx of DVT []  swelling in legs  Pulmonary: []  productive cough []  asthma/wheezing []  home O2  Neurologic: []  weakness in []  arms []  legs []  numbness in []  arms []  legs []  hx of CVA []  mini stroke [] difficulty speaking or slurred speech []  temporary loss of vision in one eye []  dizziness  Hematologic: []  hx of cancer []  bleeding problems []  problems with blood clotting easily  Endocrine:   []  diabetes []  thyroid disease  GI []  vomiting blood []  blood in stool  GU: []  CKD/renal failure []  HD--[]  M/W/F or []  T/T/S []  burning with urination []  blood in urine  Psychiatric: []  anxiety []  depression  Musculoskeletal: []  arthritis []  joint pain  Integumentary: []  rashes []  ulcers  Constitutional: []  fever []  chills   Physical Examination  Vitals:   05/21/24 1012 05/21/24 1026  BP: (!) 82/58 (!) 82/58  Pulse: 78 77  Resp: 12 16  Temp: 97.8 F (36.6 C)   SpO2: 94% 100%   There is no height or weight on file to calculate BMI.  General:  WDWN in NAD Gait: Not observed HENT: WNL, normocephalic Pulmonary: normal non-labored breathing Cardiac: regular, without  Murmurs, rubs or gallops Abdomen:  soft, NT/ND, no masses Vascular Exam/Pulses: Left upper arm AV graft with no thrill No palpable radial pulse at the wrist but hand is warm   CBC    Component Value Date/Time   WBC 8.8 12/01/2023 2229   RBC 6.09 (H) 12/01/2023 2229   HGB 17.0 (H) 02/19/2024 1417   HCT 50.0 (H) 02/19/2024 1417   PLT 262 12/01/2023 2229   MCV 89.0 12/01/2023 2229   MCH 25.9 (L) 12/01/2023 2229   MCHC 29.2 (L) 12/01/2023 2229   RDW 20.6 (H) 12/01/2023 2229    LYMPHSABS 1.1 09/30/2022  1649   MONOABS 0.6 09/30/2022 1649   EOSABS 0.0 09/30/2022 1649   BASOSABS 0.1 09/30/2022 1649    BMET    Component Value Date/Time   NA 134 (L) 02/19/2024 1417   NA 138 12/16/2023 1137   K 5.8 (H) 02/19/2024 1417   CL 101 02/19/2024 1417   CO2 18 (L) 12/16/2023 1137   GLUCOSE 125 (H) 02/19/2024 1417   BUN 59 (H) 02/19/2024 1417   BUN 41 (H) 12/16/2023 1137   CREATININE 13.30 (H) 02/19/2024 1417   CALCIUM  8.2 (L) 12/16/2023 1137   GFRNONAA 4 (L) 12/01/2023 2229   GFRAA 8 (L) 01/22/2016 0523    COAGS: Lab Results  Component Value Date   INR 0.89 01/17/2016   INR 0.94 01/15/2016   INR 0.96 01/08/2016     Non-Invasive Vascular Imaging:    N/A   ASSESSMENT/PLAN: This is a 48 y.o. female  with end-stage renal disease that presents for declot of occluded left upper arm AV graft.  This was placed at [facility].  Just recently underwent declot on 05/13/2024 by Dr. Serene.  I discussed my concern that this was just declotted on 05/13/2024.  She would like another attempt at declot.  States they forgot to give her heparin  yesterday in dialysis.  Discussed if unsuccessful will need TDC placement.  Risks benefits discussed and questions answered  Lonni DOROTHA Gaskins, MD [facility] [place] Office: [phone]  Lonni JINNY Gaskins

## 2024-05-25 ENCOUNTER — Encounter (HOSPITAL_COMMUNITY): Payer: Self-pay | Admitting: Vascular Surgery

## 2024-05-27 ENCOUNTER — Other Ambulatory Visit

## 2024-05-27 ENCOUNTER — Encounter: Payer: Self-pay | Admitting: Student

## 2024-06-03 ENCOUNTER — Other Ambulatory Visit: Payer: Self-pay | Admitting: Student

## 2024-06-03 ENCOUNTER — Encounter: Payer: Self-pay | Admitting: Student

## 2024-06-03 DIAGNOSIS — G894 Chronic pain syndrome: Secondary | ICD-10-CM

## 2024-06-03 MED ORDER — OXYCODONE HCL 10 MG PO TABS: 10.0000 mg | ORAL_TABLET | Freq: Three times a day (TID) | ORAL | 0 refills | Status: DC | PRN

## 2024-06-14 ENCOUNTER — Other Ambulatory Visit: Payer: Self-pay

## 2024-06-14 ENCOUNTER — Ambulatory Visit (HOSPITAL_COMMUNITY)
Admission: RE | Admit: 2024-06-14 | Discharge: 2024-06-14 | Disposition: A | Discharge status: Home / Self Care | Attending: Vascular Surgery | Admitting: Vascular Surgery

## 2024-06-14 ENCOUNTER — Encounter (HOSPITAL_COMMUNITY)
Admission: RE | Disposition: A | Discharge status: Home / Self Care | Payer: Self-pay | Source: Home / Self Care | Attending: Vascular Surgery

## 2024-06-14 DIAGNOSIS — Z992 Dependence on renal dialysis: Secondary | ICD-10-CM | POA: Insufficient documentation

## 2024-06-14 DIAGNOSIS — Y832 Surgical operation with anastomosis, bypass or graft as the cause of abnormal reaction of the patient, or of later complication, without mention of misadventure at the time of the procedure: Secondary | ICD-10-CM | POA: Diagnosis not present

## 2024-06-14 DIAGNOSIS — F17290 Nicotine dependence, other tobacco product, uncomplicated: Secondary | ICD-10-CM | POA: Diagnosis not present

## 2024-06-14 DIAGNOSIS — T82590A Other mechanical complication of surgically created arteriovenous fistula, initial encounter: Secondary | ICD-10-CM

## 2024-06-14 DIAGNOSIS — T82898A Other specified complication of vascular prosthetic devices, implants and grafts, initial encounter: Secondary | ICD-10-CM | POA: Diagnosis present

## 2024-06-14 DIAGNOSIS — N186 End stage renal disease: Secondary | ICD-10-CM | POA: Insufficient documentation

## 2024-06-14 DIAGNOSIS — I120 Hypertensive chronic kidney disease with stage 5 chronic kidney disease or end stage renal disease: Secondary | ICD-10-CM | POA: Diagnosis not present

## 2024-06-14 HISTORY — PX: A/V FISTULAGRAM: CATH118298

## 2024-06-14 SURGERY — A/V FISTULAGRAM
Anesthesia: LOCAL | Site: Arm Upper | Laterality: Left

## 2024-06-14 MED ORDER — LIDOCAINE HCL (PF) 1 % IJ SOLN
INTRAMUSCULAR | Status: DC | PRN
  Administered 2024-06-14: 15 mL

## 2024-06-14 MED ORDER — FENTANYL CITRATE (PF) 100 MCG/2ML IJ SOLN
INTRAMUSCULAR | Status: AC
  Filled 2024-06-14: qty 2

## 2024-06-14 MED ORDER — IODIXANOL 320 MG/ML IV SOLN
INTRAVENOUS | Status: DC | PRN
  Administered 2024-06-14: 25 mL via INTRAVENOUS

## 2024-06-14 MED ORDER — LIDOCAINE HCL (PF) 1 % IJ SOLN
INTRAMUSCULAR | Status: AC
  Filled 2024-06-14: qty 30

## 2024-06-14 MED ORDER — FENTANYL CITRATE (PF) 100 MCG/2ML IJ SOLN
INTRAMUSCULAR | Status: DC | PRN
  Administered 2024-06-14: 50 ug via INTRAVENOUS

## 2024-06-14 MED ORDER — HEPARIN (PORCINE) IN NACL 1000-0.9 UT/500ML-% IV SOLN
INTRAVENOUS | Status: DC | PRN
  Administered 2024-06-14: 500 mL via SURGICAL_CAVITY

## 2024-06-14 MED ORDER — MIDAZOLAM HCL 2 MG/2ML IJ SOLN
INTRAMUSCULAR | Status: AC
  Filled 2024-06-14: qty 2

## 2024-06-14 MED ORDER — MIDAZOLAM HCL 2 MG/2ML IJ SOLN
INTRAMUSCULAR | Status: DC | PRN
  Administered 2024-06-14: 1 mg via INTRAVENOUS

## 2024-06-14 SURGICAL SUPPLY — 5 items
KIT MICROPUNCTURE NIT STIFF (SHEATH) IMPLANT
KIT PV (KITS) ×1 IMPLANT
SHEATH PROBE COVER 6X72 (BAG) IMPLANT
TRAY PV CATH (CUSTOM PROCEDURE TRAY) ×1 IMPLANT
TUBING CIL FLEX 10 FLL-RA (TUBING) IMPLANT

## 2024-06-14 NOTE — Op Note (Signed)
 DATE OF SERVICE: 06/14/2024  PATIENT:  Sandy [name]  48 y.o. female  PRE-OPERATIVE DIAGNOSIS:  end-stage renal disease; malfunction left arm graft  POST-OPERATIVE DIAGNOSIS:  Same  PROCEDURE:   1) Ultrasound guided left arm AVG access (CPT [phone]) 2) Left upper extremity fistulagram (CPT [phone]) 3) conscious sedation (3 minutes) (CPT 99152) 4) established outpatient evaluation and management - level 3 (CPT 99213)  SURGEON:  Debby SAILOR. Magda, MD  ASSISTANT: none  ANESTHESIA:   local and IV sedation  ESTIMATED BLOOD LOSS: min  LOCAL MEDICATIONS USED:  LIDOCAINE    COUNTS: confirmed correct.  PATIENT DISPOSITION:  PACU - hemodynamically stable.   Delay start of Pharmacological VTE agent (>24hrs) due to surgical blood loss or risk of bleeding: no  INDICATION FOR PROCEDURE: Sandy [name] is a 48 y.o. female with ESRD on HD via left arm axillary loop AVG. The graft is causing some issues at dialysis. After careful discussion of risks, benefits, and alternatives the patient was offered fistulagram. The patient understood and wished to proceed.  OPERATIVE FINDINGS:  Left Upper Extremity Central venous: no stenosis Subclavian vein: no stenosis Axillary vein: prior stenting from graft into native axillary vein widely patent Graft: patent without stenosis Arterial anastomsosis: no stenosis  DESCRIPTION OF PROCEDURE: After identification of the patient in the pre-operative holding area, the patient was transferred to the operating room. The patient was positioned supine on the operating room table.  The left upper extremity was prepped and draped in standard fashion. A surgical pause was performed confirming correct patient, procedure, and operative location.  The left upper extremity was anesthetized with subcutaneous injection of 1% lidocaine  over the area of planned access. Using ultrasound guidance, the left arm axillary loop dialysis access was accessed with micropuncture technique.   Fistulogram was performed in stations with the micro sheath.  See above for details.  All endovascular equipment was removed.  A figure-of-eight stitch was applied to the exit site with good hemostasis.  Sterile bandage was applied.  Upon completion of the case instrument and sharps counts were confirmed correct. The patient was transferred to the PACU in good condition. I was present for all portions of the procedure.  PLAN: No issues with graft. Follow up as needed.  Debby SAILOR. Magda, MD [facility] Phone Number: [phone] 06/14/2024 12:41 PM

## 2024-06-14 NOTE — H&P (Signed)
 VASCULAR [facility] VEIN SPECIALISTS OF [place]  ASSESSMENT / PLAN: 48 y.o. female with end-stage renal disease.  Her dialysis center is pulling clots from her left arm AV graft. Plan fistulagram today.  CHIEF COMPLAINT: ESRD, malfunctioning left arm AVG  HISTORY OF PRESENT ILLNESS: Sandy [name] is a 48 y.o. female with end-stage renal disease dialyzing through a left arm axillary loop graft placed at [facility].  She underwent 2 thrombectomies last month.  Her dialysis tech was reporting she was pulling clots from the graft and requested a fistulogram.  The patient has no complaints today.  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)    Past Surgical History:  Procedure Laterality Date   A/V FISTULAGRAM Left 02/10/2023   Procedure: A/V Fistulagram;  Surgeon: Sheree Penne Bruckner, MD;  Location: [facility] INVASIVE CV LAB;  Service: Cardiovascular;  Laterality: Left;   AV FISTULA PLACEMENT Left 11/24/2015   Procedure: ARTERIOVENOUS (AV) FISTULA CREATION-LEFT UPPER ARM;  Surgeon: Krystal JULIANNA Doing, MD;  Location: [facility] OR;  Service: Vascular;  Laterality: Left;   AV FISTULA PLACEMENT Left 01/15/2016   Procedure: Embolectomy of Left Arm Fistula with revision of Brachiocephalic fistula and vein patch angioplasty;  Surgeon: Krystal JULIANNA Doing, MD;  Location: [facility] OR;  Service: Vascular;  Laterality: Left;   AV FISTULA PLACEMENT Right 01/17/2016   Procedure: Right Arm Brachial ARTERIOVENOUS FISTULA CREATION ;  Surgeon: Krystal JULIANNA Doing, MD;  Location: [facility] OR;  Service: Vascular;  Laterality: Right;   CESAREAN SECTION     DIALYSIS/PERMA CATHETER INSERTION  09/19/2023   Procedure: DIALYSIS/PERMA CATHETER INSERTION;  Surgeon: Melia Lynwood ORN, MD;  Location: [facility] INVASIVE CV LAB;  Service: Cardiovascular;;   INSERTION OF  DIALYSIS CATHETER Right 01/08/2016   Procedure: INSERTION OF DIALYSIS CATHETER L;  Surgeon: Krystal JULIANNA Doing, MD;  Location: [facility] OR;  Service: Vascular;  Laterality: Right;   IR REMOVAL TUN CV CATH W/O FL  05/06/2024   PERIPHERAL VASCULAR BALLOON ANGIOPLASTY  02/10/2023   Procedure: PERIPHERAL VASCULAR BALLOON ANGIOPLASTY;  Surgeon: Sheree Penne Bruckner, MD;  Location: [facility] INVASIVE CV LAB;  Service: Cardiovascular;;  L AV fistula   PERIPHERAL VASCULAR THROMBECTOMY Left 05/13/2024   Procedure: PERIPHERAL VASCULAR THROMBECTOMY;  Surgeon: Serene Gaile ORN, MD;  Location: [facility];  Service: Cardiovascular;  Laterality: Left;   PERIPHERAL VASCULAR THROMBECTOMY Left 05/21/2024   Procedure: PERIPHERAL VASCULAR THROMBECTOMY;  Surgeon: Sheree Penne Bruckner, MD;  Location: [facility];  Service: Cardiovascular;  Laterality: Left;   RENAL BIOPSY     TUNNELLED CATHETER EXCHANGE N/A 12/25/2023   Procedure: TUNNELLED CATHETER EXCHANGE;  Surgeon: Melia Lynwood ORN, MD;  Location: [facility] INVASIVE CV LAB;  Service: Cardiovascular;  Laterality: N/A;   TUNNELLED CATHETER EXCHANGE N/A 02/19/2024   Procedure: TUNNELLED CATHETER EXCHANGE;  Surgeon: Gretta Bruckner PARAS, MD;  Location: [facility];  Service: Cardiovascular;  Laterality: N/A;   VENOUS ANGIOPLASTY Left 12/25/2023   Procedure: VENOUS ANGIOPLASTY;  Surgeon: Melia Lynwood ORN, MD;  Location: [facility] INVASIVE CV LAB;  Service: Cardiovascular;  Laterality: Left;  innominate   VENOUS ANGIOPLASTY  05/13/2024   Procedure: VENOUS ANGIOPLASTY;  Surgeon: Serene Gaile ORN, MD;  Location: [facility];  Service: Cardiovascular;;  Venous Anastomosis   VENOUS STENT  05/13/2024   Procedure:  VENOUS STENT;  Surgeon: Serene Gaile ORN, MD;  Location: [facility];  Service: Cardiovascular;;  Venous Anastomosis    Family History  Problem Relation Age of Onset   Diabetes Father    Colon cancer Neg Hx    Colon polyps Neg Hx    Esophageal cancer Neg Hx    Rectal cancer Neg Hx    Stomach cancer Neg  Hx     Social History   Socioeconomic History   Marital status: Single    Spouse name: Not on file   Number of children: Not on file   Years of education: Not on file   Highest education level: Not on file  Occupational History   Not on file  Tobacco Use   Smoking status: Some Days    Current packs/day: 0.00    Types: Cigarettes    Last attempt to quit: 12/23/2015    Years since quitting: 8.4   Smokeless tobacco: Never   Tobacco comments:    2-3 cigs  Vaping Use   Vaping status: Every Day  Substance and Sexual Activity   Alcohol use: No    Alcohol/week: 0.0 standard drinks of alcohol   Drug use: No   Sexual activity: Not on file  Other Topics Concern   Not on file  Social History Narrative   Not on file   Social Drivers of Health   Financial Resource Strain: High Risk (03/24/2024)   Received from [facility]   Overall Financial Resource Strain (CARDIA)    Difficulty of Paying Living Expenses: Hard  Food Insecurity: Food Insecurity Present (03/24/2024)   Received from [facility]   Hunger Vital Sign    Within the past 12 months, you worried that your food would run out before you got the money to buy more.: Sometimes true    Within the past 12 months, the food you bought just didn't last and you didn't have money to get more.: Sometimes true  Transportation Needs: No Transportation Needs (03/25/2024)   Received from [facility] - Transportation    In the past 12 months, has lack of transportation kept you from medical appointments or from getting medications?: No    Lack of Transportation (Non-Medical): No  Recent Concern: Transportation Needs - Unmet Transportation Needs (03/24/2024)   Received from [facility] - Transportation    In the past 12 months, has lack of transportation kept you from medical appointments or from getting medications?: No    Lack of Transportation  (Non-Medical): Yes  Physical Activity: Inactive (11/18/2023)   Exercise Vital Sign    Days of Exercise per Week: 0 days    Minutes of Exercise per Session: 0 min  Stress: No Stress Concern Present (11/18/2023)   [employer] of Occupational Health - Occupational Stress Questionnaire    Feeling of Stress : Only a little  Social Connections: Moderately Integrated (11/18/2023)   Social Connection and Isolation Panel    Frequency of Communication with Friends and Family: Twice a week    Frequency of Social Gatherings with Friends and Family: Twice a week    Attends Religious Services: 1 to 4 times per year    Active Member of [employer] or Organizations: No    Attends Banker Meetings: Never    Marital Status: Married  Catering manager Violence: Not At Risk (11/18/2023)   Humiliation, Afraid, Rape, and Kick questionnaire    Fear  of Current or Ex-Partner: No    Emotionally Abused: No    Physically Abused: No    Sexually Abused: No    Allergies  Allergen Reactions   Lisinopril Cough    Current Facility-Administered Medications  Medication Dose Route Frequency Provider Last Rate Last Admin   fentaNYL  (SUBLIMAZE ) injection    PRN Magda Debby SAILOR, MD   50 mcg at 06/14/24 1235   Heparin  (Porcine) in NaCl 1000-0.9 UT/500ML-% SOLN    PRN Magda Debby SAILOR, MD   500 mL at 06/14/24 1229   iodixanol  (VISIPAQUE ) 320 MG/ML injection    PRN Magda Debby SAILOR, MD   25 mL at 06/14/24 1239   lidocaine  (PF) (XYLOCAINE ) 1 % injection    PRN Magda Debby SAILOR, MD   15 mL at 06/14/24 1229   midazolam  (VERSED ) injection    PRN Magda Debby SAILOR, MD   1 mg at 06/14/24 1235    PHYSICAL EXAM Vitals:   06/14/24 1115 06/14/24 1128 06/14/24 1228  BP: 95/77 95/77   Pulse: 89 85   Resp: 12 16   Temp: 97.8 F (36.6 C)    TempSrc: Oral    SpO2: 94% 94% 98%   No acute distress Regular rate and rhythm Unlabored breathing Left arm axillary loop graft with thrill  PERTINENT LABORATORY AND  RADIOLOGIC DATA  Most recent CBC    Latest Ref Rng & Units 02/19/2024    2:17 PM 12/01/2023   10:29 PM 09/19/2023    1:01 PM  CBC  WBC 4.0 - 10.5 K/uL  8.8    Hemoglobin 12.0 - 15.0 g/dL 82.9  84.1  86.6   Hematocrit 36.0 - 46.0 % 50.0  54.2  39.0   Platelets 150 - 400 K/uL  262       Most recent CMP    Latest Ref Rng & Units 02/19/2024    2:17 PM 12/16/2023   11:37 AM 12/01/2023   10:29 PM  CMP  Glucose 70 - 99 mg/dL 874  83  92   BUN 6 - 20 mg/dL 59  41  49   Creatinine 0.44 - 1.00 mg/dL 86.69  88.26  89.26   Sodium 135 - 145 mmol/L 134  138  136   Potassium 3.5 - 5.1 mmol/L 5.8  4.7  4.6   Chloride 98 - 111 mmol/L 101  92  86   CO2 20 - 29 mmol/L  18  30   Calcium  8.7 - 10.2 mg/dL  8.2  9.0   Total Protein 6.0 - 8.5 g/dL  8.3    Total Bilirubin 0.0 - 1.2 mg/dL  0.3    Alkaline Phos 44 - 121 IU/L  390    AST 0 - 40 IU/L  58    ALT 0 - 32 IU/L  12      Renal function CrCl cannot be calculated (Patient's most recent lab result is older than the maximum 21 days allowed.).  Hemoglobin A1C (%)  Date Value  01/02/2023 4.8    LDL Chol Calc (NIH)  Date Value Ref Range Status  08/29/2022 90 0 - 99 mg/dL Final    Debby SAILOR. Magda, MD [facility] Phone Number: [phone] 06/14/2024 12:39 PM   Total time spent on preparing this encounter including chart review, data review, collecting history, examining the patient, and coordinating care: 30 min  Portions of this report may have been transcribed using voice recognition software.  Every  effort has been made to ensure accuracy; however, inadvertent computerized transcription errors may still be present.

## 2024-06-15 ENCOUNTER — Encounter (HOSPITAL_COMMUNITY): Payer: Self-pay | Admitting: Vascular Surgery

## 2024-06-30 ENCOUNTER — Encounter: Payer: Self-pay | Admitting: Student

## 2024-06-30 DIAGNOSIS — G894 Chronic pain syndrome: Secondary | ICD-10-CM

## 2024-07-01 MED ORDER — OXYCODONE HCL 10 MG PO TABS
10.0000 mg | ORAL_TABLET | Freq: Three times a day (TID) | ORAL | 0 refills | Status: DC | PRN
Start: 2024-07-05 — End: 2024-08-02

## 2024-07-26 ENCOUNTER — Other Ambulatory Visit: Payer: Self-pay | Admitting: Medical Genetics

## 2024-07-26 DIAGNOSIS — Z006 Encounter for examination for normal comparison and control in clinical research program: Secondary | ICD-10-CM

## 2024-07-31 ENCOUNTER — Encounter: Payer: Self-pay | Admitting: Student

## 2024-07-31 DIAGNOSIS — G894 Chronic pain syndrome: Secondary | ICD-10-CM

## 2024-08-02 MED ORDER — OXYCODONE HCL 10 MG PO TABS: 10.0000 mg | ORAL_TABLET | Freq: Three times a day (TID) | ORAL | 0 refills | Status: DC | PRN

## 2024-08-02 NOTE — Telephone Encounter (Signed)
 Do you have Jan schedule; if so can you schedule her an appt.

## 2024-08-05 ENCOUNTER — Other Ambulatory Visit: Payer: Self-pay | Admitting: Student

## 2024-08-05 DIAGNOSIS — R21 Rash and other nonspecific skin eruption: Secondary | ICD-10-CM

## 2024-08-05 DIAGNOSIS — L853 Xerosis cutis: Secondary | ICD-10-CM

## 2024-08-05 DIAGNOSIS — N186 End stage renal disease: Secondary | ICD-10-CM

## 2024-08-05 NOTE — Progress Notes (Signed)
 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 Unfortunately, no photos in chart. Patient is currently receiving Na thiosulfate at [facility]. However, still in need of confirmation. Will send referral to dermatology, and if unable to establish for biopsy, will opt for plastic surgery. Discussed with IMTS referral coordinator.   Patient has chronic pain on opiate treatment. Dose of oxycodone  has not changed.  Hadassah Ala, MD

## 2024-08-11 ENCOUNTER — Telehealth: Payer: Self-pay

## 2024-08-11 ENCOUNTER — Ambulatory Visit

## 2024-08-11 VITALS — BP 110/80 | HR 100 | Temp 97.6°F | Ht 66.0 in | Wt 179.6 lb

## 2024-08-11 DIAGNOSIS — Z0000 Encounter for general adult medical examination without abnormal findings: Secondary | ICD-10-CM

## 2024-08-11 NOTE — Progress Notes (Addendum)
 Chief Complaint  Patient presents with   Medicare Wellness    SUBSEQUENT     Subjective:   Sandy [name] is a 48 y.o. female who presents for a Medicare Annual Wellness Visit.  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)   Past Surgical History:  Procedure Laterality Date   A/V FISTULAGRAM Left 02/10/2023   Procedure: A/V Fistulagram;  Surgeon: Sheree Penne Bruckner, MD;  Location: [facility] INVASIVE CV LAB;  Service: Cardiovascular;  Laterality: Left;   A/V FISTULAGRAM Left 06/14/2024   Procedure: A/V Fistulagram;  Surgeon: Magda Debby SAILOR, MD;  Location: [facility];  Service: Cardiovascular;  Laterality: Left;   AV FISTULA PLACEMENT Left 11/24/2015   Procedure: ARTERIOVENOUS (AV) FISTULA CREATION-LEFT UPPER ARM;  Surgeon: Krystal JULIANNA Doing, MD;  Location: [facility] OR;  Service: Vascular;  Laterality: Left;   AV FISTULA PLACEMENT Left 01/15/2016   Procedure: Embolectomy of Left Arm Fistula with revision of Brachiocephalic fistula and vein patch angioplasty;  Surgeon: Krystal JULIANNA Doing, MD;  Location: [facility] OR;  Service: Vascular;  Laterality: Left;   AV FISTULA PLACEMENT Right 01/17/2016   Procedure: Right Arm Brachial ARTERIOVENOUS FISTULA CREATION ;  Surgeon: Krystal JULIANNA Doing, MD;  Location: [facility] OR;  Service: Vascular;  Laterality: Right;   CESAREAN SECTION     DIALYSIS/PERMA CATHETER INSERTION  09/19/2023   Procedure: DIALYSIS/PERMA CATHETER INSERTION;  Surgeon: Melia Lynwood ORN, MD;  Location: [facility] INVASIVE CV LAB;  Service: Cardiovascular;;   INSERTION OF DIALYSIS CATHETER Right 01/08/2016   Procedure: INSERTION OF DIALYSIS CATHETER L;  Surgeon: Krystal JULIANNA Doing, MD;  Location: [facility] OR;  Service: Vascular;  Laterality: Right;   IR REMOVAL TUN CV CATH W/O FL   05/06/2024   PERIPHERAL VASCULAR BALLOON ANGIOPLASTY  02/10/2023   Procedure: PERIPHERAL VASCULAR BALLOON ANGIOPLASTY;  Surgeon: Sheree Penne Bruckner, MD;  Location: [facility] INVASIVE CV LAB;  Service: Cardiovascular;;  L AV fistula   PERIPHERAL VASCULAR THROMBECTOMY Left 05/13/2024   Procedure: PERIPHERAL VASCULAR THROMBECTOMY;  Surgeon: Serene Gaile ORN, MD;  Location: [facility];  Service: Cardiovascular;  Laterality: Left;   PERIPHERAL VASCULAR THROMBECTOMY Left 05/21/2024   Procedure: PERIPHERAL VASCULAR THROMBECTOMY;  Surgeon: Sheree Penne Bruckner, MD;  Location: [facility];  Service: Cardiovascular;  Laterality: Left;   RENAL BIOPSY     TUNNELLED CATHETER EXCHANGE N/A 12/25/2023   Procedure: TUNNELLED CATHETER EXCHANGE;  Surgeon: Melia Lynwood ORN, MD;  Location: [facility] INVASIVE CV LAB;  Service: Cardiovascular;  Laterality: N/A;   TUNNELLED CATHETER EXCHANGE N/A 02/19/2024   Procedure: TUNNELLED CATHETER EXCHANGE;  Surgeon: Gretta Bruckner PARAS, MD;  Location: [facility];  Service: Cardiovascular;  Laterality: N/A;   VENOUS ANGIOPLASTY Left 12/25/2023   Procedure: VENOUS ANGIOPLASTY;  Surgeon: Melia Lynwood ORN, MD;  Location: [facility] INVASIVE CV LAB;  Service: Cardiovascular;  Laterality: Left;  innominate   VENOUS ANGIOPLASTY  05/13/2024   Procedure: VENOUS ANGIOPLASTY;  Surgeon: Serene Gaile ORN, MD;  Location: [facility];  Service: Cardiovascular;;  Venous Anastomosis   VENOUS STENT  05/13/2024   Procedure: VENOUS STENT;  Surgeon: Serene Gaile ORN, MD;  Location: [facility];  Service: Cardiovascular;;  Venous Anastomosis   Family History  Problem Relation Age of Onset  Diabetes Father    Colon cancer Neg Hx    Colon polyps Neg Hx    Esophageal cancer Neg Hx    Rectal cancer Neg Hx    Stomach cancer Neg Hx    Social History   Occupational History   Not on file  Tobacco Use   Smoking status: Some Days    Current packs/day: 0.00    Types: Cigarettes    Last attempt to quit: 12/23/2015    Years  since quitting: 8.6   Smokeless tobacco: Never   Tobacco comments:    2-3 cigs  Vaping Use   Vaping status: Every Day  Substance and Sexual Activity   Alcohol use: No    Alcohol/week: 0.0 standard drinks of alcohol   Drug use: No   Sexual activity: Not on file   Tobacco Counseling Ready to quit: Yes Counseling given: Not Answered Tobacco comments: 2-3 cigs  SDOH Screenings   Food Insecurity: No Food Insecurity (08/11/2024)  Housing: Low Risk  (08/11/2024)  Transportation Needs: No Transportation Needs (08/11/2024)  Utilities: Not At Risk (08/11/2024)  Alcohol Screen: Low Risk  (11/18/2023)  Depression (PHQ2-9): Low Risk  (08/11/2024)  Financial Resource Strain: High Risk (03/24/2024)   Received from [facility]  Physical Activity: Inactive (08/11/2024)  Social Connections: Moderately Integrated (08/11/2024)  Stress: No Stress Concern Present (08/11/2024)  Tobacco Use: High Risk (08/11/2024)  Health Literacy: Adequate Health Literacy (08/11/2024)   See flowsheets for full screening details  Depression Screen PHQ 2 & 9 Depression Scale- Over the past 2 weeks, how often have you been bothered by any of the following problems? Little interest or pleasure in doing things: 0 Feeling down, depressed, or hopeless (PHQ Adolescent also includes...irritable): 0 PHQ-2 Total Score: 0 Trouble falling or staying asleep, or sleeping too much: 0 (SLEEPS OFF & OFF; AVG 5-6 HOURS) Feeling tired or having little energy: 0 Poor appetite or overeating (PHQ Adolescent also includes...weight loss): 0 Feeling bad about yourself - or that you are a failure or have let yourself or your family down: 0 Trouble concentrating on things, such as reading the newspaper or watching television (PHQ Adolescent also includes...like school work): 0 Moving or speaking so slowly that other people could have noticed. Or the opposite - being so fidgety or restless that you have been moving around a  lot more than usual: 0 Thoughts that you would be better off dead, or of hurting yourself in some way: 0 PHQ-9 Total Score: 0 If you checked off any problems, how difficult have these problems made it for you to do your work, take care of things at home, or get along with other people?: Not difficult at all  Depression Treatment Depression Interventions/Treatment : EYV7-0 Score <4 Follow-up Not Indicated     Goals Addressed             This Visit's Progress    08/11/2024: To stay healthy.         Visit info / Clinical Intake: Medicare Wellness Visit Type:: Subsequent Annual Wellness Visit Persons participating in visit:: patient Medicare Wellness Visit Mode:: In-person (required for WTM) Information given by:: patient Interpreter Needed?: No Pre-visit prep was completed: yes AWV questionnaire completed by patient prior to visit?: no Living arrangements:: lives with spouse/significant other Patient's Overall Health Status Rating: good Typical amount of pain: (!) a lot Does pain affect daily life?: (!) yes Are you currently prescribed opioids?: (!) yes  Dietary Habits and Nutritional Risks How many  meals a day?: 2 (SNACK MID DAY) Eats fruit and vegetables daily?: yes Most meals are obtained by: preparing own meals In the last 2 weeks, have you had any of the following?: (!) nausea, vomiting, diarrhea (HEARTBURN AFTER ICE COFFEE AND SPAGEHETTI, VOMITED TWICE TWO DIFFERENT DAYS) Diabetic:: no  Functional Status Activities of Daily Living (to include ambulation/medication): Independent Ambulation: Independent Medication Administration: Independent Home Management: Independent Manage your own finances?: (!) no Primary transportation is: driving Concerns about vision?: no *vision screening is required for WTM* Concerns about hearing?: no  Fall Screening Falls in the past year?: 0 Number of falls in past year: 0 Was there an injury with Fall?: 0 Fall Risk Category  Calculator: 0 Patient Fall Risk Level: Low Fall Risk  Fall Risk Patient at Risk for Falls Due to: No Fall Risks Fall risk Follow up: Falls evaluation completed; Education provided  Home and Transportation Safety: All rugs have non-skid backing?: N/A, no rugs All stairs or steps have railings?: N/A, no stairs Grab bars in the bathtub or shower?: (!) no Have non-skid surface in bathtub or shower?: (!) no Good home lighting?: yes Regular seat belt use?: yes Hospital stays in the last year:: no  Cognitive Assessment Difficulty concentrating, remembering, or making decisions? : no (BSE: READING) Will 6CIT or Mini Cog be Completed: no 6CIT or Mini Cog Declined: patient alert, oriented, able to answer questions appropriately and recall recent events What year is it?: 0 points What month is it?: 0 points Give patient an address phrase to remember (5 components): [place] Sharps [address] About what time is it?: 0 points Count backwards from 20 to 1: 0 points Say the months of the year in reverse: 0 points Repeat the address phrase from earlier: 0 points 6 CIT Score: 0 points  Advance Directives (For Healthcare) Does Patient Have a Medical Advance Directive?: No Would patient like information on creating a medical advance directive?: No - Patient declined  Reviewed/Updated  Reviewed/Updated: Reviewed All (Medical, Surgical, Family, Medications, Allergies, Care Teams, Patient Goals)        Objective:    Today's Vitals   08/11/24 1315  BP: 110/80  Pulse: 100  Temp: 97.6 F (36.4 C)  TempSrc: Temporal  SpO2: 92%  Weight: 179 lb 9.6 oz (81.5 kg)  Height: 5' 6 (1.676 m)  PainSc: 7   PainLoc: Back   Body mass index is 28.99 kg/m.  Current Medications (verified) Outpatient Encounter Medications as of 08/11/2024  Medication Sig   AURYXIA 1 GM 210 MG(Fe) tablet Take 420 mg by mouth 3 (three) times daily with meals.   cyclobenzaprine  (FLEXERIL ) 10 MG tablet Take 1 tablet  (10 mg total) by mouth at bedtime.   diclofenac  Sodium (VOLTAREN ) 1 % GEL Apply 4 g topically 4 (four) times daily.   Dulaglutide  (TRULICITY ) 0.75 MG/0.5ML SOAJ Inject 0.75 mg into the skin once a week.   lidocaine  (HM LIDOCAINE  PATCH) 4 % Place 2 patches onto the skin daily. Apply patch to the most painful area for up to 12 hours in a 24 hour period   midodrine  (PROAMATINE ) 10 MG tablet Take 10 mg by mouth daily as needed (Low blood pressure). Mon., Wed., Fri.   Oxycodone  HCl 10 MG TABS Take 1 tablet (10 mg total) by mouth 3 (three) times daily as needed.   [START ON 09/02/2024] Oxycodone  HCl 10 MG TABS Take 1 tablet (10 mg total) by mouth 3 (three) times daily as needed.   [START ON 10/02/2024]  Oxycodone  HCl 10 MG TABS Take 1 tablet (10 mg total) by mouth 3 (three) times daily as needed.   varenicline  (CHANTIX ) 1 MG tablet Take 0.5 tablets (0.5 mg total) by mouth daily.   VELTASSA  8.4 g packet Take 1 packet by mouth daily as needed (for hyperkalemia).   No facility-administered encounter medications on file as of 08/11/2024.   Hearing/Vision screen Hearing Screening - Comments:: Patient has adequate hearing, no hearing aids. Vision Screening - Comments:: Patient has adequate vision, no eyeglasses. Last eye doctor was in [place] . Immunizations and Health Maintenance Health Maintenance  Topic Date Due   Hepatitis B Vaccines 19-59 Average Risk (1 of 3 - 19+ 3-dose series) Never done   Cervical Cancer Screening (HPV/Pap Cotest)  Never done   Colonoscopy  Never done   COVID-19 Vaccine (1 - 2025-26 season) Never done   Medicare Annual Wellness (AWV)  08/11/2025   Mammogram  01/21/2026   DTaP/Tdap/Td (2 - Td or Tdap) 02/22/2032   Pneumococcal Vaccine  Completed   Influenza Vaccine  Completed   Hepatitis C Screening  Completed   HIV Screening  Completed   HPV VACCINES  Aged Out   Meningococcal B Vaccine  Aged Out        Assessment/Plan:  This is a routine wellness examination for  [place].  Patient Care Team: Elnora Ip, MD as PCP - General [place], Kellie, MD as Consulting Physician (Nephrology) Center, [facility]  I have personally reviewed and noted the following in the patient's chart:   Medical and social history Use of alcohol, tobacco or illicit drugs  Current medications and supplements including opioid prescriptions. Functional ability and status Nutritional status Physical activity Advanced directives List of other physicians Hospitalizations, surgeries, and ER visits in previous 12 months Vitals Screenings to include cognitive, depression, and falls Referrals and appointments  No orders of the defined types were placed in this encounter.  In addition, I have reviewed and discussed with patient certain preventive protocols, quality metrics, and best practice recommendations. A written personalized care plan for preventive services as well as general preventive health recommendations were provided to patient.   Roz LOISE Fuller, LPN   [id number]   Return in about 1 year (around 08/11/2025) for Medicare wellness.  After Visit Summary: (In Person-Printed) AVS printed and given to the patient  Nurse Notes: Patient aware of current care gaps.

## 2024-08-11 NOTE — Patient Instructions (Signed)
 Ms. Wrobel,  Thank you for taking the time for your Medicare Wellness Visit. I appreciate your continued commitment to your health goals. Please review the care plan we discussed, and feel free to reach out if I can assist you further.  Please note that Annual Wellness Visits do not include a physical exam. Some assessments may be limited, especially if the visit was conducted virtually. If needed, we may recommend an in-person follow-up with your provider.  Ongoing Care Seeing your primary care provider every 3 to 6 months helps us  monitor your health and provide consistent, personalized care.   Referrals If a referral was made during today's visit and you haven't received any updates within two weeks, please contact the referred provider directly to check on the status.  Recommended Screenings:  Health Maintenance  Topic Date Due   Hepatitis B Vaccine (1 of 3 - 19+ 3-dose series) Never done   Pap with HPV screening  Never done   Colon Cancer Screening  Never done   COVID-19 Vaccine (1 - 2025-26 season) Never done   Medicare Annual Wellness Visit  11/17/2024   Breast Cancer Screening  01/21/2026   DTaP/Tdap/Td vaccine (2 - Td or Tdap) 02/22/2032   Pneumococcal Vaccine  Completed   Flu Shot  Completed   Hepatitis C Screening  Completed   HIV Screening  Completed   HPV Vaccine  Aged Out   Meningitis B Vaccine  Aged Out       08/11/2024    1:16 PM  Advanced Directives  Does Patient Have a Medical Advance Directive? No  Would patient like information on creating a medical advance directive? No - Patient declined    Vision: Annual vision screenings are recommended for early detection of glaucoma, cataracts, and diabetic retinopathy. These exams can also reveal signs of chronic conditions such as diabetes and high blood pressure.  Dental: Annual dental screenings help detect early signs of oral cancer, gum disease, and other conditions linked to overall health, including heart  disease and diabetes.  Please see the attached documents for additional preventive care recommendations.

## 2024-08-11 NOTE — Telephone Encounter (Signed)
 Patient is requesting a refill on Cyclobenzaprine  to take twice per day to help with sleeping and pain.  Eddy Termine N. Tomie, LPN [facility] Annual Wellness Team Direct Dial : [phone]

## 2024-08-17 ENCOUNTER — Other Ambulatory Visit: Payer: Self-pay

## 2024-08-17 ENCOUNTER — Encounter (HOSPITAL_COMMUNITY): Payer: Self-pay | Admitting: Vascular Surgery

## 2024-08-17 ENCOUNTER — Encounter (HOSPITAL_COMMUNITY): Disposition: A | Discharge status: Home / Self Care | Payer: Self-pay | Attending: Vascular Surgery

## 2024-08-17 ENCOUNTER — Ambulatory Visit (HOSPITAL_COMMUNITY)
Admit: 2024-08-17 | Discharge: 2024-08-17 | Disposition: A | Discharge status: Home / Self Care | Attending: Vascular Surgery | Admitting: Vascular Surgery

## 2024-08-17 DIAGNOSIS — N186 End stage renal disease: Secondary | ICD-10-CM | POA: Insufficient documentation

## 2024-08-17 DIAGNOSIS — Y832 Surgical operation with anastomosis, bypass or graft as the cause of abnormal reaction of the patient, or of later complication, without mention of misadventure at the time of the procedure: Secondary | ICD-10-CM | POA: Diagnosis not present

## 2024-08-17 DIAGNOSIS — Z992 Dependence on renal dialysis: Secondary | ICD-10-CM | POA: Diagnosis not present

## 2024-08-17 DIAGNOSIS — I120 Hypertensive chronic kidney disease with stage 5 chronic kidney disease or end stage renal disease: Secondary | ICD-10-CM | POA: Diagnosis not present

## 2024-08-17 DIAGNOSIS — F17210 Nicotine dependence, cigarettes, uncomplicated: Secondary | ICD-10-CM | POA: Insufficient documentation

## 2024-08-17 DIAGNOSIS — T82868A Thrombosis of vascular prosthetic devices, implants and grafts, initial encounter: Secondary | ICD-10-CM | POA: Diagnosis present

## 2024-08-17 DIAGNOSIS — T82858A Stenosis of vascular prosthetic devices, implants and grafts, initial encounter: Secondary | ICD-10-CM | POA: Diagnosis not present

## 2024-08-17 HISTORY — PX: PERIPHERAL VASCULAR THROMBECTOMY: CATH118306

## 2024-08-17 SURGERY — PERIPHERAL VASCULAR THROMBECTOMY
Anesthesia: LOCAL | Site: Arm Upper | Laterality: Left

## 2024-08-17 MED ORDER — LIDOCAINE HCL (PF) 1 % IJ SOLN
INTRAMUSCULAR | Status: AC
  Filled 2024-08-17: qty 30

## 2024-08-17 MED ORDER — ONDANSETRON HCL 4 MG/2ML IJ SOLN
INTRAMUSCULAR | Status: AC
  Filled 2024-08-17: qty 2

## 2024-08-17 MED ORDER — IODIXANOL 320 MG/ML IV SOLN
INTRAVENOUS | Status: DC | PRN
Start: 2024-08-17 — End: 2024-08-17
  Administered 2024-08-17: 40 mL via INTRAVENOUS

## 2024-08-17 MED ORDER — ONDANSETRON HCL 4 MG/2ML IJ SOLN
INTRAMUSCULAR | Status: DC | PRN
Start: 2024-08-17 — End: 2024-08-17
  Administered 2024-08-17: 4 mg via INTRAVENOUS

## 2024-08-17 MED ORDER — HEPARIN SODIUM (PORCINE) 1000 UNIT/ML IJ SOLN
INTRAMUSCULAR | Status: DC | PRN
Start: 2024-08-17 — End: 2024-08-17
  Administered 2024-08-17: 5000 [IU] via INTRAVENOUS
  Administered 2024-08-17: 3000 [IU] via INTRAVENOUS

## 2024-08-17 MED ORDER — LIDOCAINE HCL (PF) 1 % IJ SOLN
INTRAMUSCULAR | Status: DC | PRN
Start: 2024-08-17 — End: 2024-08-17
  Administered 2024-08-17 (×2): 5 mL via SUBCUTANEOUS

## 2024-08-17 MED ORDER — FENTANYL CITRATE (PF) 100 MCG/2ML IJ SOLN
INTRAMUSCULAR | Status: AC
  Filled 2024-08-17: qty 2

## 2024-08-17 MED ORDER — HEPARIN SODIUM (PORCINE) 1000 UNIT/ML IJ SOLN
INTRAMUSCULAR | Status: AC
Start: 2024-08-17 — End: 2024-08-17
  Filled 2024-08-17: qty 10

## 2024-08-17 MED ORDER — HEPARIN (PORCINE) IN NACL 1000-0.9 UT/500ML-% IV SOLN
INTRAVENOUS | Status: DC | PRN
Start: 2024-08-17 — End: 2024-08-17
  Administered 2024-08-17 (×2): 500 mL

## 2024-08-17 MED ORDER — MIDAZOLAM HCL 2 MG/2ML IJ SOLN
INTRAMUSCULAR | Status: AC
  Filled 2024-08-17: qty 2

## 2024-08-17 SURGICAL SUPPLY — 15 items
BALLOON FOGARTY 5FR 40 (CATHETERS) IMPLANT
BALLOON MUSTANG 7X60X75 (BALLOONS) IMPLANT
CATH ANGIO 5F BER2 65CM (CATHETERS) IMPLANT
CATH STR 7FR 55 BRITE (CATHETERS) IMPLANT
DEVICE INFLATION ENCORE 26 (MISCELLANEOUS) IMPLANT
GLIDEWIRE ADV .035X180CM (WIRE) IMPLANT
KIT MICROPUNCTURE NIT STIFF (SHEATH) IMPLANT
SHEATH PINNACLE R/O II 6F 4CM (SHEATH) IMPLANT
SHEATH PINNACLE R/O II 7F 4CM (SHEATH) IMPLANT
SHEATH PROBE COVER 6X72 (BAG) IMPLANT
STOPCOCK MORSE 400PSI 3WAY (MISCELLANEOUS) IMPLANT
TRAY PV CATH (CUSTOM PROCEDURE TRAY) ×1 IMPLANT
TUBING CIL FLEX 10 FLL-RA (TUBING) IMPLANT
WIRE BENTSON .035X145CM (WIRE) IMPLANT
WIRE TORQFLEX AUST .018X40CM (WIRE) IMPLANT

## 2024-08-17 NOTE — H&P (Signed)
 Hospital Consult    Reason for Consult: Occluded left arm AV graft Requesting Physician: Dr. Dennise, nephrology MRN #:  [id number]  History of Present Illness: This is a 48 y.o. female well-known to the vascular surgery service having undergone multiple interventions to the left arm AV loop graft.  This loop graft was placed at [place], and underwent thrombectomy in August of this year with outflow stenting.  She presents today with an occluded graft to discuss redo thrombectomy versus tunneled dialysis catheter placement.  On exam, Sandy [name] was doing well.  She had no complaints.  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)    Past Surgical History:  Procedure Laterality Date   A/V FISTULAGRAM Left 02/10/2023   Procedure: A/V Fistulagram;  Surgeon: Sheree Penne Bruckner, MD;  Location: [facility] INVASIVE CV LAB;  Service: Cardiovascular;  Laterality: Left;   A/V FISTULAGRAM Left 06/14/2024   Procedure: A/V Fistulagram;  Surgeon: Magda Debby SAILOR, MD;  Location: [facility] PV LAB;  Service: Cardiovascular;  Laterality: Left;   AV FISTULA PLACEMENT Left 11/24/2015   Procedure: ARTERIOVENOUS (AV) FISTULA CREATION-LEFT UPPER ARM;  Surgeon: Krystal JULIANNA Doing, MD;  Location: [facility] OR;  Service: Vascular;  Laterality: Left;   AV FISTULA PLACEMENT Left 01/15/2016   Procedure: Embolectomy of Left Arm Fistula with revision of Brachiocephalic fistula and vein patch angioplasty;  Surgeon: Krystal JULIANNA Doing, MD;  Location: [facility] OR;  Service: Vascular;  Laterality: Left;   AV FISTULA PLACEMENT Right 01/17/2016   Procedure: Right Arm Brachial ARTERIOVENOUS FISTULA CREATION ;  Surgeon: Krystal JULIANNA Doing, MD;  Location: [facility] OR;  Service: Vascular;  Laterality: Right;   CESAREAN SECTION     DIALYSIS/PERMA CATHETER INSERTION   09/19/2023   Procedure: DIALYSIS/PERMA CATHETER INSERTION;  Surgeon: Melia Lynwood ORN, MD;  Location: [facility] INVASIVE CV LAB;  Service: Cardiovascular;;   INSERTION OF DIALYSIS CATHETER Right 01/08/2016   Procedure: INSERTION OF DIALYSIS CATHETER L;  Surgeon: Krystal JULIANNA Doing, MD;  Location: [facility] OR;  Service: Vascular;  Laterality: Right;   IR REMOVAL TUN CV CATH W/O FL  05/06/2024   PERIPHERAL VASCULAR BALLOON ANGIOPLASTY  02/10/2023   Procedure: PERIPHERAL VASCULAR BALLOON ANGIOPLASTY;  Surgeon: Sheree Penne Bruckner, MD;  Location: [facility] INVASIVE CV LAB;  Service: Cardiovascular;;  L AV fistula   PERIPHERAL VASCULAR THROMBECTOMY Left 05/13/2024   Procedure: PERIPHERAL VASCULAR THROMBECTOMY;  Surgeon: Serene Gaile ORN, MD;  Location: [facility] PV [facility];  Service: Cardiovascular;  Laterality: Left;   PERIPHERAL VASCULAR THROMBECTOMY Left 05/21/2024   Procedure: PERIPHERAL VASCULAR THROMBECTOMY;  Surgeon: Sheree Penne Bruckner, MD;  Location: [facility];  Service: Cardiovascular;  Laterality: Left;   RENAL BIOPSY     TUNNELLED CATHETER EXCHANGE N/A 12/25/2023   Procedure: TUNNELLED CATHETER EXCHANGE;  Surgeon: Melia Lynwood ORN, MD;  Location: [facility] INVASIVE CV LAB;  Service: Cardiovascular;  Laterality: N/A;   TUNNELLED CATHETER EXCHANGE N/A 02/19/2024   Procedure: TUNNELLED CATHETER EXCHANGE;  Surgeon: Gretta Bruckner PARAS, MD;  Location: [facility];  Service: Cardiovascular;  Laterality: N/A;   VENOUS ANGIOPLASTY Left 12/25/2023   Procedure: VENOUS ANGIOPLASTY;  Surgeon: Melia Lynwood ORN, MD;  Location: [facility] INVASIVE CV LAB;  Service: Cardiovascular;  Laterality: Left;  innominate   VENOUS ANGIOPLASTY  05/13/2024   Procedure: VENOUS  ANGIOPLASTY;  Surgeon: Serene Gaile ORN, MD;  Location: [facility];  Service: Cardiovascular;;  Venous Anastomosis   VENOUS STENT  05/13/2024   Procedure: VENOUS STENT;  Surgeon: Serene Gaile ORN, MD;  Location: [facility];  Service: Cardiovascular;;  Venous Anastomosis    Allergies  Allergen  Reactions   Lisinopril Cough    Prior to Admission medications   Medication Sig Start Date End Date Taking? Authorizing Provider  AURYXIA 1 GM 210 MG(Fe) tablet Take 420 mg by mouth 3 (three) times daily with meals. 10/31/23   [provider]  cyclobenzaprine  (FLEXERIL ) 10 MG tablet Take 1 tablet (10 mg total) by mouth at bedtime. 05/20/24   Harrie Bruckner, DO  diclofenac  Sodium (VOLTAREN ) 1 % GEL Apply 4 g topically 4 (four) times daily. 01/08/24   Gomez-Caraballo, Maria, MD  Dulaglutide  (TRULICITY ) 0.75 MG/0.5ML SOAJ Inject 0.75 mg into the skin once a week. 12/04/23   Masters, Katie, DO  lidocaine  (HM LIDOCAINE  PATCH) 4 % Place 2 patches onto the skin daily. Apply patch to the most painful area for up to 12 hours in a 24 hour period 05/20/24   Harrie Bruckner, DO  midodrine  (PROAMATINE ) 10 MG tablet Take 10 mg by mouth daily as needed (Low blood pressure). Mon., Wed., Fri. 07/09/22   [provider]  Oxycodone  HCl 10 MG TABS Take 1 tablet (10 mg total) by mouth 3 (three) times daily as needed. 08/02/24 09/01/24  Elnora Ip, MD  Oxycodone  HCl 10 MG TABS Take 1 tablet (10 mg total) by mouth 3 (three) times daily as needed. 09/02/24 10/02/24  Elnora Ip, MD  Oxycodone  HCl 10 MG TABS Take 1 tablet (10 mg total) by mouth 3 (three) times daily as needed. 10/02/24 11/01/24  Elnora Ip, MD  varenicline  (CHANTIX ) 1 MG tablet Take 0.5 tablets (0.5 mg total) by mouth daily. 05/20/24 08/19/24  Harrie Bruckner, DO  VELTASSA  8.4 g packet Take 1 packet by mouth daily as needed (for hyperkalemia). 08/08/22   Hongalgi, Trenda BIRCH, MD    Social History   Socioeconomic History   Marital status: Single    Spouse name: Not on file   Number of children: Not on file   Years of education: Not on file   Highest education level: Not on file  Occupational History   Not on file  Tobacco Use   Smoking status: Some Days    Current packs/day: 0.00    Types:  Cigarettes    Last attempt to quit: 12/23/2015    Years since quitting: 8.6   Smokeless tobacco: Never   Tobacco comments:    2-3 cigs  Vaping Use   Vaping status: Every Day  Substance and Sexual Activity   Alcohol use: No    Alcohol/week: 0.0 standard drinks of alcohol   Drug use: No   Sexual activity: Not on file  Other Topics Concern   Not on file  Social History Narrative   Not on file   Social Drivers of Health   Financial Resource Strain: High Risk (03/24/2024)   Received from [facility]   Overall Financial Resource Strain (CARDIA)    Difficulty of Paying Living Expenses: Hard  Food Insecurity: No Food Insecurity (08/11/2024)   Hunger Vital Sign    Worried About Running Out of Food in the Last Year: Never true    Ran Out of Food in the Last Year: Never true  Transportation Needs: No Transportation Needs (08/11/2024)   PRAPARE - Transportation  Lack of Transportation (Medical): No    Lack of Transportation (Non-Medical): No  Physical Activity: Inactive (08/11/2024)   Exercise Vital Sign    Days of Exercise per Week: 0 days    Minutes of Exercise per Session: 0 min  Stress: No Stress Concern Present (08/11/2024)   [employer] of Occupational Health - Occupational Stress Questionnaire    Feeling of Stress: Not at all  Social Connections: Moderately Integrated (08/11/2024)   Social Connection and Isolation Panel    Frequency of Communication with Friends and Family: Twice a week    Frequency of Social Gatherings with Friends and Family: Twice a week    Attends Religious Services: 1 to 4 times per year    Active Member of [employer] or Organizations: No    Attends Banker Meetings: Never    Marital Status: Married  Catering Manager Violence: Not At Risk (08/11/2024)   Humiliation, Afraid, Rape, and Kick questionnaire    Fear of Current or Ex-Partner: No    Emotionally Abused: No    Physically Abused: No    Sexually Abused: No    Family History  Problem Relation Age of Onset   Diabetes Father    Colon cancer Neg Hx    Colon polyps Neg Hx    Esophageal cancer Neg Hx    Rectal cancer Neg Hx    Stomach cancer Neg Hx     ROS: Otherwise negative unless mentioned in HPI  Physical Examination  Vitals:   08/17/24 0754 08/17/24 0759  BP: 105/73 105/73  Pulse: 85 84  Resp: 12 16  Temp: (!) 97.5 F (36.4 C)   SpO2: 95% 96%   There is no height or weight on file to calculate BMI.  General:  WDWN in NAD Gait: Not observed HENT: WNL, normocephalic Pulmonary: normal non-labored breathing, without Rales, rhonchi,  wheezing Cardiac: regular Abdomen:  soft, NT/ND, no masses Skin: without rashes Vascular Exam/Pulses: 1+ radial Extremities: without ischemic changes, without Gangrene , without cellulitis; without open wounds;  Musculoskeletal: no muscle wasting or atrophy  Neurologic: A&O X 3;  No focal weakness or paresthesias are detected; speech is fluent/normal Psychiatric:  The pt has Normal affect. Lymph:  Unremarkable  CBC    Component Value Date/Time   WBC 8.8 12/01/2023 2229   RBC 6.09 (H) 12/01/2023 2229   HGB 17.0 (H) 02/19/2024 1417   HCT 50.0 (H) 02/19/2024 1417   PLT 262 12/01/2023 2229   MCV 89.0 12/01/2023 2229   MCH 25.9 (L) 12/01/2023 2229   MCHC 29.2 (L) 12/01/2023 2229   RDW 20.6 (H) 12/01/2023 2229   LYMPHSABS 1.1 09/30/2022 1649   MONOABS 0.6 09/30/2022 1649   EOSABS 0.0 09/30/2022 1649   BASOSABS 0.1 09/30/2022 1649    BMET    Component Value Date/Time   NA 134 (L) 02/19/2024 1417   NA 138 12/16/2023 1137   K 5.8 (H) 02/19/2024 1417   CL 101 02/19/2024 1417   CO2 18 (L) 12/16/2023 1137   GLUCOSE 125 (H) 02/19/2024 1417   BUN 59 (H) 02/19/2024 1417   BUN 41 (H) 12/16/2023 1137   CREATININE 13.30 (H) 02/19/2024 1417   CALCIUM  8.2 (L) 12/16/2023 1137   GFRNONAA 4 (L) 12/01/2023 2229   GFRAA 8 (L) 01/22/2016 0523    COAGS: Lab Results  Component Value Date   INR  0.89 01/17/2016   INR 0.94 01/15/2016   INR 0.96 01/08/2016     ASSESSMENT/PLAN: This is a 48 y.o.  female with end-stage renal disease currently being dialyzed through a left upper arm AV loop graft.  The graft is thrombosed.  We discussed options include tunneled dialysis catheter placement versus attempted graft thrombectomy.  Sandy [name] would like to do everything possible to continue to use her AV loop graft.  We discussed that if I am unsuccessful, she would need a tunneled dialysis catheter.  After discussing risk benefits, Sandy [name] elected to proceed with attempted graft thrombectomy, possible tunneled dialysis catheter placement   Fonda FORBES Rim MD MS [facility] [phone] 08/17/2024  8:08 AM

## 2024-08-17 NOTE — Op Note (Addendum)
    Patient name: Sandy [name] MRN: [id number] DOB: 09-03-1976 Sex: female  08/17/2024 Pre-operative Diagnosis: End-stage renal disease requiring dialysis.  AV graft malfunction Post-operative diagnosis:  Same Surgeon:  Fonda FORBES Rim, MD Procedure Performed: 1.  Ultrasound-guided micropuncture access of the left upper arm AV loop graft x 2 2.  Percutaneous transluminal mechanical thrombectomy with balloon angioplasty 3.  Central balloon angioplasty of the central portion of the axillary vein 7 x 60 mm 4.  Diagnostic AV fistulogram   Indications: Patient is a 48 year old female with left arm axillary artery to axillary vein AV loop graft.  On presentation today, the graft was thrombosed.  We discussed thrombectomy versus tunneled dialysis catheter placement.  She wants to avoid tunneled dialysis catheter if at all possible.  She has very few access options left.  After discussing her benefits, Sandy [name] elected to proceed  Findings:  Occluded left arm axillary artery to axillary vein loop graft Stenosis at the central aspect of previously placed Viabahn stents in the axillary vein No arterial anastomotic stenosis No central stenosis   Procedure:  The patient was identified in the holding area and taken to room 8.  The patient was then placed supine on the table and prepped and draped in the usual sterile fashion.  A time out was called.   An ultrasound was used to access the left arm AV loop graft in both the venous and arterial sides.  The arterial access was run retrograde, the venous access was run antegrade.  I then ran a wire into the inferior vena cava, and exchanged this out for a catheter.  Medication was administered-Zofran  and heparin .  Next, central venogram followed demonstrating no significant stenosis.  I replaced the catheter with a wire, and moved to balloon venoplasty of the previously placed axillary stents and venous portion of the AV loop graft.  A 7 x 60 mm balloon was brought  onto the field and inflated in succession from native axillary vein through the stents and into the AV graft.  Once the balloon was inflated to the level of the 6 French sheath, the balloon was removed.  I elected to take a 5 French over-the-wire embolectomy catheter which I placed on the wire that was in the native axillary artery and this was pulled through the arterial anastomosis.  There was no significant stenosis appreciated.  I then aspirated through the 7 French sheath with clot return.  At this point, pulsatile bleeding was noted through both sheaths.  Imaging demonstrated residual stenosis and thrombus within the previously placed Viabahn stents in the axillary vein and residual stenosis in the AV graft on the venous side.  The 7 x 60 mm balloon was used again and reinflated along the entirety of the AV graft and previously placed axillary stents.  Follow-up angiography demonstrated excellent result with resolution of flow-limiting stenosis.  The graft was widely patent.  Access managed with Monocryl suture.  The patient was taken to holding in stable condition.  The graft can continue to be used   Fonda FORBES Rim MD [facility] [place] Office: [phone]

## 2024-08-17 NOTE — Progress Notes (Signed)
 Patient had a vomiting episode prior to intervention.  Stated she felt much better.  She is aware that I will not be giving any form of sedation as I do not want her to have an aspiration event.  I discussed that we could cancel her case, and schedule it for tomorrow with sedation.  She stated she would like the case to be done today without sedation.  She is feeling better so I think that this is reasonable.

## 2024-08-23 NOTE — Progress Notes (Signed)
 Internal Medicine Attending:  I reviewed the AWV findings of the medical professional who conducted the visit. I was present in the office suite and immediately available to provide assistance and direction throughout the time the service was provided.

## 2024-08-25 ENCOUNTER — Other Ambulatory Visit: Payer: Self-pay | Admitting: Student

## 2024-08-25 DIAGNOSIS — G894 Chronic pain syndrome: Secondary | ICD-10-CM

## 2024-08-25 MED ORDER — CYCLOBENZAPRINE HCL 10 MG PO TABS: 10.0000 mg | ORAL_TABLET | Freq: Every day | ORAL | 3 refills | Status: DC

## 2024-08-25 NOTE — Progress Notes (Signed)
 Refill for cyclobenaprine sent to [facility].

## 2024-09-06 ENCOUNTER — Ambulatory Visit (HOSPITAL_COMMUNITY)
Admission: RE | Admit: 2024-09-06 | Discharge: 2024-09-06 | Disposition: A | Discharge status: Home / Self Care | Source: Ambulatory Visit | Attending: Vascular Surgery | Admitting: Vascular Surgery

## 2024-09-06 ENCOUNTER — Other Ambulatory Visit: Payer: Self-pay

## 2024-09-06 ENCOUNTER — Encounter (HOSPITAL_COMMUNITY)
Admission: RE | Disposition: A | Discharge status: Home / Self Care | Payer: Self-pay | Source: Ambulatory Visit | Attending: Vascular Surgery

## 2024-09-06 DIAGNOSIS — T82868A Thrombosis of vascular prosthetic devices, implants and grafts, initial encounter: Secondary | ICD-10-CM | POA: Diagnosis not present

## 2024-09-06 DIAGNOSIS — I120 Hypertensive chronic kidney disease with stage 5 chronic kidney disease or end stage renal disease: Secondary | ICD-10-CM | POA: Diagnosis not present

## 2024-09-06 DIAGNOSIS — F17290 Nicotine dependence, other tobacco product, uncomplicated: Secondary | ICD-10-CM | POA: Insufficient documentation

## 2024-09-06 DIAGNOSIS — N186 End stage renal disease: Secondary | ICD-10-CM

## 2024-09-06 DIAGNOSIS — Z992 Dependence on renal dialysis: Secondary | ICD-10-CM | POA: Insufficient documentation

## 2024-09-06 DIAGNOSIS — Y832 Surgical operation with anastomosis, bypass or graft as the cause of abnormal reaction of the patient, or of later complication, without mention of misadventure at the time of the procedure: Secondary | ICD-10-CM | POA: Insufficient documentation

## 2024-09-06 DIAGNOSIS — T82858A Stenosis of vascular prosthetic devices, implants and grafts, initial encounter: Secondary | ICD-10-CM

## 2024-09-06 HISTORY — PX: PERIPHERAL VASCULAR THROMBECTOMY: CATH118306

## 2024-09-06 HISTORY — PX: VENOUS ANGIOPLASTY: CATH118376

## 2024-09-06 SURGERY — PERIPHERAL VASCULAR THROMBECTOMY
Anesthesia: LOCAL

## 2024-09-06 MED ORDER — MIDAZOLAM HCL 2 MG/2ML IJ SOLN
INTRAMUSCULAR | Status: AC
  Filled 2024-09-06: qty 2

## 2024-09-06 MED ORDER — APIXABAN 5 MG PO TABS: 5.0000 mg | ORAL_TABLET | Freq: Two times a day (BID) | ORAL | 3 refills | Status: AC

## 2024-09-06 MED ORDER — FENTANYL CITRATE (PF) 100 MCG/2ML IJ SOLN
INTRAMUSCULAR | Status: AC
  Filled 2024-09-06: qty 2

## 2024-09-06 MED ORDER — HEPARIN (PORCINE) IN NACL 1000-0.9 UT/500ML-% IV SOLN
INTRAVENOUS | Status: DC | PRN
  Administered 2024-09-06: 11:00:00 500 mL

## 2024-09-06 MED ORDER — MIDAZOLAM HCL (PF) 2 MG/2ML IJ SOLN
INTRAMUSCULAR | Status: DC | PRN
  Administered 2024-09-06: 11:00:00 1 mg via INTRAVENOUS

## 2024-09-06 MED ORDER — LIDOCAINE HCL (PF) 1 % IJ SOLN
INTRAMUSCULAR | Status: DC | PRN
  Administered 2024-09-06: 11:00:00 5 mL via SUBCUTANEOUS
  Administered 2024-09-06: 11:00:00 2 mL via SUBCUTANEOUS
  Administered 2024-09-06: 11:00:00 3 mL via SUBCUTANEOUS
  Administered 2024-09-06: 11:00:00 2 mL via SUBCUTANEOUS
  Administered 2024-09-06: 11:00:00 5 mL via SUBCUTANEOUS

## 2024-09-06 MED ORDER — LIDOCAINE HCL (PF) 1 % IJ SOLN
INTRAMUSCULAR | Status: AC
  Filled 2024-09-06: qty 30

## 2024-09-06 MED ORDER — FENTANYL CITRATE (PF) 100 MCG/2ML IJ SOLN
INTRAMUSCULAR | Status: DC | PRN
  Administered 2024-09-06: 11:00:00 50 ug via INTRAVENOUS

## 2024-09-06 MED ORDER — IODIXANOL 320 MG/ML IV SOLN
INTRAVENOUS | Status: DC | PRN
  Administered 2024-09-06: 11:00:00 25 mL via INTRAVENOUS

## 2024-09-06 MED ORDER — HEPARIN SODIUM (PORCINE) 1000 UNIT/ML IJ SOLN
INTRAMUSCULAR | Status: AC
  Filled 2024-09-06: qty 10

## 2024-09-06 MED ORDER — HEPARIN SODIUM (PORCINE) 1000 UNIT/ML IJ SOLN
INTRAMUSCULAR | Status: DC | PRN
  Administered 2024-09-06: 11:00:00 8000 [IU] via INTRAVENOUS

## 2024-09-06 SURGICAL SUPPLY — 15 items
BALLOON FOGARTY 5FR 40 (CATHETERS) IMPLANT
BALLOON MUSTANG 8X60X75 (BALLOONS) IMPLANT
CATH ANGIO 5F BER2 65CM (CATHETERS) IMPLANT
CATH STR 7FR 55 BRITE (CATHETERS) IMPLANT
DEVICE INFLATION ENCORE 26 (MISCELLANEOUS) IMPLANT
GLIDEWIRE ADV .035X180CM (WIRE) IMPLANT
GUIDEWIRE ANGLED .035X150CM (WIRE) IMPLANT
KIT MICROPUNCTURE NIT STIFF (SHEATH) IMPLANT
SHEATH PINNACLE R/O II 6F 4CM (SHEATH) IMPLANT
SHEATH PINNACLE R/O II 7F 4CM (SHEATH) IMPLANT
STOPCOCK MORSE 400PSI 3WAY (MISCELLANEOUS) IMPLANT
SYR MEDALLION 10ML (SYRINGE) IMPLANT
TRAY PV CATH (CUSTOM PROCEDURE TRAY) ×2 IMPLANT
TUBING CIL FLEX 10 FLL-RA (TUBING) IMPLANT
WIRE TORQFLEX AUST .018X40CM (WIRE) IMPLANT

## 2024-09-06 NOTE — H&P (Signed)
 VASCULAR [facility]IN SPECIALISTS OF [place]  ASSESSMENT / PLAN: 48 y.o. female with thrombosed left upper extremity arteriovenous graft.  Plan thrombectomy today in the Cath Lab.  CHIEF COMPLAINT: End-stage renal disease, thrombosed left arm AV graft  HISTORY OF PRESENT ILLNESS: Sandy [name] is a 48 y.o. female with end-stage renal disease and a poorly functional left upper extremity arteriovenous graft created at [facility].  The graft has been intervened upon nearly every month since August.  It has again thrombosed.  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)    Past Surgical History:  Procedure Laterality Date   A/V FISTULAGRAM Left 02/10/2023   Procedure: A/V Fistulagram;  Surgeon: Sheree Penne Bruckner, MD;  Location: [facility] INVASIVE CV LAB;  Service: Cardiovascular;  Laterality: Left;   A/V FISTULAGRAM Left 06/14/2024   Procedure: A/V Fistulagram;  Surgeon: Magda Debby SAILOR, MD;  Location: [facility] PV LAB;  Service: Cardiovascular;  Laterality: Left;   AV FISTULA PLACEMENT Left 11/24/2015   Procedure: ARTERIOVENOUS (AV) FISTULA CREATION-LEFT UPPER ARM;  Surgeon: Krystal JULIANNA Doing, MD;  Location: [facility] OR;  Service: Vascular;  Laterality: Left;   AV FISTULA PLACEMENT Left 01/15/2016   Procedure: Embolectomy of Left Arm Fistula with revision of Brachiocephalic fistula and vein patch angioplasty;  Surgeon: Krystal JULIANNA Doing, MD;  Location: [facility] OR;  Service: Vascular;  Laterality: Left;   AV FISTULA PLACEMENT Right 01/17/2016   Procedure: Right Arm Brachial ARTERIOVENOUS FISTULA CREATION ;  Surgeon: Krystal JULIANNA Doing, MD;  Location: [facility] OR;  Service: Vascular;  Laterality: Right;   CESAREAN SECTION     DIALYSIS/PERMA CATHETER INSERTION  09/19/2023   Procedure: DIALYSIS/PERMA CATHETER INSERTION;  Surgeon:  Melia Lynwood ORN, MD;  Location: [facility] INVASIVE CV LAB;  Service: Cardiovascular;;   INSERTION OF DIALYSIS CATHETER Right 01/08/2016   Procedure: INSERTION OF DIALYSIS CATHETER L;  Surgeon: Krystal JULIANNA Doing, MD;  Location: [facility] OR;  Service: Vascular;  Laterality: Right;   IR REMOVAL TUN CV CATH W/O FL  05/06/2024   PERIPHERAL VASCULAR BALLOON ANGIOPLASTY  02/10/2023   Procedure: PERIPHERAL VASCULAR BALLOON ANGIOPLASTY;  Surgeon: Sheree Penne Bruckner, MD;  Location: [facility] INVASIVE CV LAB;  Service: Cardiovascular;;  L AV fistula   PERIPHERAL VASCULAR THROMBECTOMY Left 05/13/2024   Procedure: PERIPHERAL VASCULAR THROMBECTOMY;  Surgeon: Serene Gaile ORN, MD;  Location: [facility] PV LAB;  Service: Cardiovascular;  Laterality: Left;   PERIPHERAL VASCULAR THROMBECTOMY Left 05/21/2024   Procedure: PERIPHERAL VASCULAR THROMBECTOMY;  Surgeon: Sheree Penne Bruckner, MD;  Location: [facility];  Service: Cardiovascular;  Laterality: Left;   PERIPHERAL VASCULAR THROMBECTOMY Left 08/17/2024   Procedure: PERIPHERAL VASCULAR THROMBECTOMY;  Surgeon: Lanis Fonda BRAVO, MD;  Location: [facility] LAB;  Service: Cardiovascular;  Laterality: Left;   RENAL BIOPSY     TUNNELLED CATHETER EXCHANGE N/A 12/25/2023   Procedure: TUNNELLED CATHETER EXCHANGE;  Surgeon: Melia Lynwood ORN, MD;  Location: [facility] INVASIVE CV LAB;  Service: Cardiovascular;  Laterality: N/A;   TUNNELLED CATHETER EXCHANGE N/A 02/19/2024   Procedure: TUNNELLED CATHETER EXCHANGE;  Surgeon: Gretta Bruckner PARAS, MD;  Location: [facility] LAB;  Service: Cardiovascular;  Laterality: N/A;   VENOUS ANGIOPLASTY Left 12/25/2023   Procedure: VENOUS ANGIOPLASTY;  Surgeon: Melia Lynwood ORN, MD;  Location: [facility] INVASIVE CV LAB;  Service:  Cardiovascular;  Laterality: Left;  innominate   VENOUS ANGIOPLASTY  05/13/2024   Procedure: VENOUS ANGIOPLASTY;  Surgeon: Serene Gaile ORN, MD;  Location: [facility] LAB;  Service: Cardiovascular;;  Venous Anastomosis   VENOUS STENT  05/13/2024   Procedure: VENOUS STENT;   Surgeon: Serene Gaile ORN, MD;  Location: [facility] PV LAB;  Service: Cardiovascular;;  Venous Anastomosis    Family History  Problem Relation Age of Onset   Diabetes Father    Colon cancer Neg Hx    Colon polyps Neg Hx    Esophageal cancer Neg Hx    Rectal cancer Neg Hx    Stomach cancer Neg Hx     Social History   Socioeconomic History   Marital status: Single    Spouse name: Not on file   Number of children: Not on file   Years of education: Not on file   Highest education level: Not on file  Occupational History   Not on file  Tobacco Use   Smoking status: Some Days    Current packs/day: 0.00    Average packs/day: 0.3 packs/day    Types: Cigarettes    Last attempt to quit: 12/23/2015    Years since quitting: 8.7   Smokeless tobacco: Never   Tobacco comments:    2-3 cigs  Vaping Use   Vaping status: Every Day  Substance and Sexual Activity   Alcohol use: No    Alcohol/week: 0.0 standard drinks of alcohol   Drug use: No   Sexual activity: Not on file  Other Topics Concern   Not on file  Social History Narrative   Not on file   Social Drivers of Health   Tobacco Use: High Risk (08/11/2024)   Patient History    Smoking Tobacco Use: Some Days    Smokeless Tobacco Use: Never    Passive Exposure: Not on file  Financial Resource Strain: High Risk (03/24/2024)   Received from [facility]   Overall Financial Resource Strain (CARDIA)    Difficulty of Paying Living Expenses: Hard  Food Insecurity: No Food Insecurity (08/11/2024)   Epic    Worried About Radiation Protection Practitioner of Food in the Last Year: Never true    Ran Out of Food in the Last Year: Never true  Transportation Needs: No Transportation Needs (08/11/2024)   Epic    Lack of Transportation (Medical): No    Lack of Transportation (Non-Medical): No  Physical Activity: Inactive (08/11/2024)   Exercise Vital Sign    Days of Exercise per Week: 0 days    Minutes of Exercise per Session: 0 min  Stress: No  Stress Concern Present (08/11/2024)   [employer] of Occupational Health - Occupational Stress Questionnaire    Feeling of Stress: Not at all  Social Connections: Moderately Integrated (08/11/2024)   Social Connection and Isolation Panel    Frequency of Communication with Friends and Family: Twice a week    Frequency of Social Gatherings with Friends and Family: Twice a week    Attends Religious Services: 1 to 4 times per year    Active Member of [employer] or Organizations: No    Attends Banker Meetings: Never    Marital Status: Married  Catering Manager Violence: Not At Risk (08/11/2024)   Epic    Fear of Current or Ex-Partner: No    Emotionally Abused: No    Physically Abused: No    Sexually Abused: No  Depression (PHQ2-9): Low Risk (08/11/2024)   Depression (PHQ2-9)  PHQ-2 Score: 0  Alcohol Screen: Low Risk (11/18/2023)   Alcohol Screen    Last Alcohol Screening Score (AUDIT): 0  Housing: Low Risk (08/11/2024)   Epic    Unable to Pay for Housing in the Last Year: No    Number of Times Moved in the Last Year: 0    Homeless in the Last Year: No  Utilities: Not At Risk (08/11/2024)   Epic    Threatened with loss of utilities: No  Health Literacy: Adequate Health Literacy (08/11/2024)   B1300 Health Literacy    Frequency of need for help with medical instructions: Never    Allergies[1]  No current facility-administered medications for this encounter.    PHYSICAL EXAM Vitals:   09/06/24 0841 09/06/24 0849  BP: 111/77 111/77  Pulse: 89 88  Resp: 12 16  Temp: 97.8 F (36.6 C)   TempSrc: Oral   SpO2:  100%   No distress Regular rate and rhythm Unlabored breathing Thrombosed LUE AVG  PERTINENT LABORATORY AND RADIOLOGIC DATA  Most recent CBC    Latest Ref Rng & Units 02/19/2024    2:17 PM 12/01/2023   10:29 PM 09/19/2023    1:01 PM  CBC  WBC 4.0 - 10.5 K/uL  8.8    Hemoglobin 12.0 - 15.0 g/dL 82.9  84.1  86.6   Hematocrit 36.0 - 46.0 % 50.0   54.2  39.0   Platelets 150 - 400 K/uL  262       Most recent CMP    Latest Ref Rng & Units 02/19/2024    2:17 PM 12/16/2023   11:37 AM 12/01/2023   10:29 PM  CMP  Glucose 70 - 99 mg/dL 874  83  92   BUN 6 - 20 mg/dL 59  41  49   Creatinine 0.44 - 1.00 mg/dL 86.69  88.26  89.26   Sodium 135 - 145 mmol/L 134  138  136   Potassium 3.5 - 5.1 mmol/L 5.8  4.7  4.6   Chloride 98 - 111 mmol/L 101  92  86   CO2 20 - 29 mmol/L  18  30   Calcium  8.7 - 10.2 mg/dL  8.2  9.0   Total Protein 6.0 - 8.5 g/dL  8.3    Total Bilirubin 0.0 - 1.2 mg/dL  0.3    Alkaline Phos 44 - 121 IU/L  390    AST 0 - 40 IU/L  58    ALT 0 - 32 IU/L  12      Renal function CrCl cannot be calculated (Patient's most recent lab result is older than the maximum 21 days allowed.).  Hemoglobin A1C (%)  Date Value  01/02/2023 4.8    LDL Chol Calc (NIH)  Date Value Ref Range Status  08/29/2022 90 0 - 99 mg/dL Final     Debby SAILOR. Magda, MD FACS [facility]in Specialists of [facility] Phone Number: [phone] 09/06/2024 10:28 AM   Total time spent on preparing this encounter including chart review, data review, collecting history, examining the patient, and coordinating care: 30 min  Portions of this report may have been transcribed using voice recognition software.  Every effort has been made to ensure accuracy; however, inadvertent computerized transcription errors may still be present.       [1]  Allergies Allergen Reactions   Lisinopril Cough

## 2024-09-06 NOTE — Op Note (Signed)
 DATE OF SERVICE: 09/06/2024  PATIENT:  Sandy [name]  48 y.o. female  PRE-OPERATIVE DIAGNOSIS:  end-stage renal disease; thrombosed left arm AVG  POST-OPERATIVE DIAGNOSIS:  Same  PROCEDURE:   1) Ultrasound guided left arm AVG access x2  2) percutaneous mechanical thrombectomy of left arm arteriovenous graft with angioplasty (8x95mm Mustang) 3) conscious sedation (23 minutes) 4) established outpatient evaluation and management - level 3  SURGEON:  Debby SAILOR. Magda, MD  ASSISTANT: none  ANESTHESIA:   local and IV sedation  ESTIMATED BLOOD LOSS: min  LOCAL MEDICATIONS USED:  LIDOCAINE    COUNTS: confirmed correct.  PATIENT DISPOSITION:  PACU - hemodynamically stable.   Delay start of Pharmacological VTE agent (>24hrs) due to surgical blood loss or risk of bleeding: no  INDICATION FOR PROCEDURE: Sandy [name] is a 48 y.o. female with left arm AVG that has thrombosed. After careful discussion of risks, benefits, and alternatives the patient was offered thrombectomy. The patient understood and wished to proceed.  OPERATIVE FINDINGS:  Right Upper Extremity: Thrombosed arteriovenous graft.  Good result from thrombectomy.  Residual stenosis in the prior stented segment.  Resolved with angioplasty.  DESCRIPTION OF PROCEDURE: After identification of the patient in the pre-operative holding area, the patient was transferred to the operating room. The patient was positioned supine on the operating room table.  The right upper extremity was prepped and draped in standard fashion. A surgical pause was performed confirming correct patient, procedure, and operative location.  The right upper extremity was anesthetized with subcutaneous injection of 1% lidocaine  over the area of planned access. Using ultrasound guidance, the right upper extremity dialysis access was accessed with micropuncture technique towards both the arterial and venous anastomosis with separate access.  A Glidewire advantage  was navigated in the central veins towards the venous anastomosis.  Over the wire a guide cath was advanced into the central veins.  An angiogram was performed confirming their patency.  Heparin  was infused.  Conscious sedation was infused.  Mechanical thrombectomy was performed using suction with the guide catheter.  I then performed angioplasty of the venous outflow segment with an 8 x 60 mm Mustang balloon.  Towards the arterial anastomosis, wire was navigated into the native brachial artery.  Over-the-wire Fogarty thrombectomy was then performed using #5 Fogarty embolectomy balloon catheter.  3 passes were made.  Stopcocks on the sheaths were opened and inflow was achieved.  I performed a fistulogram.  This showed residual stenosis in the outflow stenting.  This was angioplastied with an 8 x 60 mm Mustang balloon.  This had resolved after angioplasty.  Satisfied we ended the case here.  All endovascular equipment was removed.  A figure-of-eight stitch was applied to the exit site with good hemostasis.  Sterile bandage was applied.  Upon completion of the case instrument and sharps counts were confirmed correct. The patient was transferred to the PACU in good condition. I was present for all portions of the procedure.  PLAN: Start Eliquis .  Consider abandoning access if it continues to thrombose on Eliquis .  Debby SAILOR. Magda, MD [facility] Vascular and Vein Specialists of [facility] Phone Number: [phone] 09/06/2024 11:11 AM

## 2024-09-07 ENCOUNTER — Encounter (HOSPITAL_COMMUNITY): Payer: Self-pay | Admitting: Vascular Surgery

## 2024-10-12 ENCOUNTER — Ambulatory Visit: Admitting: Student

## 2024-10-14 ENCOUNTER — Ambulatory Visit: Admitting: Student

## 2024-10-14 ENCOUNTER — Other Ambulatory Visit: Payer: Self-pay

## 2024-10-14 VITALS — BP 91/62 | HR 64 | Temp 98.0°F | Ht 66.0 in | Wt 184.8 lb

## 2024-10-14 DIAGNOSIS — N186 End stage renal disease: Secondary | ICD-10-CM

## 2024-10-14 DIAGNOSIS — G894 Chronic pain syndrome: Secondary | ICD-10-CM

## 2024-10-14 DIAGNOSIS — I959 Hypotension, unspecified: Secondary | ICD-10-CM | POA: Diagnosis not present

## 2024-10-14 DIAGNOSIS — Z992 Dependence on renal dialysis: Secondary | ICD-10-CM | POA: Diagnosis not present

## 2024-10-14 MED ORDER — CYCLOBENZAPRINE HCL 10 MG PO TABS: 10.0000 mg | ORAL_TABLET | Freq: Two times a day (BID) | ORAL | 3 refills | Status: AC

## 2024-10-14 NOTE — Progress Notes (Unsigned)
 "   Subjective:  CC: follow up of chronic conditions  HPI:  [name] is a 49 y.o. female with a past medical history stated below and presents today for follow up with PCP. Please see problem based assessment and plan for additional details.    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)    Medications Ordered Prior to Encounter[1]  Family History  Problem Relation Age of Onset   Diabetes Father    Colon cancer Neg Hx    Colon polyps Neg Hx    Esophageal cancer Neg Hx    Rectal cancer Neg Hx    Stomach cancer Neg Hx     Social History   Socioeconomic History   Marital status: Single    Spouse name: Not on file   Number of children: Not on file   Years of education: Not on file   Highest education level: Some college, no degree  Occupational History   Not on file  Tobacco Use   Smoking status: Some Days    Current packs/day: 0.00    Average packs/day: 0.3 packs/day    Types: Cigarettes    Last attempt to quit: 12/23/2015    Years since quitting: 8.8   Smokeless tobacco: Never   Tobacco comments:    2-3 cigs  Vaping Use   Vaping status: Every Day  Substance and Sexual Activity   Alcohol use: No    Alcohol/week: 0.0 standard drinks of alcohol   Drug use: No   Sexual activity: Not on file  Other Topics Concern   Not on file  Social History Narrative   Not on file   Social Drivers of Health   Tobacco Use: High Risk (08/11/2024)   Patient History    Smoking Tobacco Use: Some Days    Smokeless Tobacco Use: Never    Passive Exposure: Not on file  Financial Resource Strain: High Risk (10/13/2024)   Overall Financial Resource Strain (CARDIA)    Difficulty of Paying Living Expenses: Hard  Food Insecurity: Food Insecurity Present  (10/13/2024)   Epic    Worried About Programme Researcher, Broadcasting/film/video in the Last Year: Sometimes true    Ran Out of Food in the Last Year: Often true  Transportation Needs: Unmet Transportation Needs (10/13/2024)   Epic    Lack of Transportation (Medical): Yes    Lack of Transportation (Non-Medical): Yes  Physical Activity: Insufficiently Active (10/13/2024)   Exercise Vital Sign    Days of Exercise per Week: 2 days    Minutes of Exercise per Session: 10 min  Stress: Stress Concern Present (10/13/2024)   [employer] of Occupational Health - Occupational Stress Questionnaire    Feeling of Stress: Very much  Social Connections: Socially Isolated (10/13/2024)   Social Connection and Isolation Panel    Frequency of Communication with Friends and Family: Three times a week    Frequency of Social Gatherings with Friends and Family: Never    Attends Religious Services: Never    Database Administrator or Organizations: No    Attends Banker Meetings: Not on file    Marital Status: Separated  Intimate Partner Violence: Not At Risk (08/11/2024)   Epic    Fear of Current or Ex-Partner:  No    Emotionally Abused: No    Physically Abused: No    Sexually Abused: No  Depression (PHQ2-9): Low Risk (10/14/2024)   Depression (PHQ2-9)    PHQ-2 Score: 0  Alcohol Screen: Low Risk (11/18/2023)   Alcohol Screen    Last Alcohol Screening Score (AUDIT): 0  Housing: High Risk (10/13/2024)   Epic    Unable to Pay for Housing in the Last Year: Yes    Number of Times Moved in the Last Year: 1    Homeless in the Last Year: Patient declined  Utilities: Not At Risk (08/11/2024)   Epic    Threatened with loss of utilities: No  Health Literacy: Adequate Health Literacy (08/11/2024)   B1300 Health Literacy    Frequency of need for help with medical instructions: Never    Review of Systems: ROS negative except for what is noted on the assessment and plan.  Objective:   Vitals:   10/14/24 0848  BP:  91/62  Pulse: 64  Temp: 98 F (36.7 C)  TempSrc: Oral  SpO2: 98%  Weight: 184 lb 12.8 oz (83.8 kg)  Height: 5' 6 (1.676 m)    Physical Exam: Constitutional: well-appearing woman sitting in chair, in no acute distress HENT: normocephalic atraumatic, mucous membranes moist Eyes: conjunctiva non-erythematous Neck: supple Cardiovascular: regular rate and rhythm, no m/r/g Pulmonary/Chest: normal work of breathing on room air, lungs clear to auscultation bilaterally Abdominal: soft, non-tender, non-distended MSK: No lower extremity edema. LUE graft site without ecchymoses. Left anterior chest HD cath site without drainage or fluctuance Neurological: alert & oriented x 3 Skin: warm and dry Psych: Pleasant mood and affect       10/14/2024    8:52 AM  Depression screen PHQ 2/9  Decreased Interest 0  Down, Depressed, Hopeless 0  PHQ - 2 Score 0     Assessment & Plan:   ESRD on dialysis [facility]) Patient with difficulties with LUE graft over the past 6 months, now with catheter placed by [facility] vascular surgeon after thrombectomy done at [facility] in 2025. Currently ASA but not Xarelto. Not volume overloaded. Takes midodrine  on HD days, last day yesterday. No current plans for transplant. -Patient will contact [facility] provider, who is now managing graft access, to clarify need for Xarelto, which [facility] vascular surgeons recommended  There was also the question of patient ?calciphylaxis and she has nodular, non painful rash in her lower extremities for which she was referred to Dermatology. Patient reports that this resolved after resuming binder therapy and she did not follow up with Dermatology. She has contact info and will establish again for preparedness should the rash recur and biopsy is needed  Chronic pain syndrome Multiple sites. Neck, lower back, bilateral legs. Currently managed with 10 mg oxycodone  every 8 hrs PRN, which she consistently takes around the clock. Using some adjunctive  therapy. We had an honest discussion about her age, 49YO, and the concern of requiring higher doses of medication the longer she is on therapy. She continues to be independent living and independent with ADLs and iADLs despite baseline pain.  She will try to lengthen the time between doses and use topical medications when possible. Will refill medications today. PDMP reviewed.   - If patient needs procedures, she will contact the clinic to get acute on chronic pain medications if not provided by the specialist office instead doubling her chronic opiate medications - Filled 3x future scripts 1/3 to be filled on 2/9 2/3 to be filled  on 3/11 3/3 to be filled on 4/10 - Trial of cyclobenzaprine  for muscle spasms, especially at night  Low systolic blood pressure Chronic. Asymptomatic. 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  - Will continue to monitor    Return in about 6 months (around 04/13/2025) for Chronic medical conditions the next time I am in clinic.  Patient discussed with Dr. Jeanelle Hadassah Kristy Rosario, MD [facility] Internal Medicine Residency Program          [1]  Current Outpatient Medications on File Prior to Visit  Medication Sig Dispense Refill   apixaban  (ELIQUIS ) 5 MG TABS tablet Take 1 tablet (5 mg total) by mouth 2 (two) times daily. 60 tablet 3   AURYXIA 1 GM 210 MG(Fe) tablet Take 420 mg by mouth 3 (three) times daily with meals.     diclofenac  Sodium (VOLTAREN ) 1 % GEL Apply 4 g topically 4 (four) times daily. 300 g 0   Dulaglutide  (TRULICITY ) 0.75 MG/0.5ML SOAJ Inject 0.75 mg into the skin once a week. 2 mL 3   lidocaine  (HM LIDOCAINE  PATCH) 4 % Place 2 patches onto the skin daily. Apply patch to the most painful area for up to 12 hours in a 24 hour period 60 patch 3   midodrine  (PROAMATINE ) 10 MG tablet Take 10 mg by mouth daily as needed (Low blood pressure). Mon., Wed., Fri.     VELTASSA  8.4 g packet Take 1 packet by  mouth daily as needed (for hyperkalemia).     No current facility-administered medications on file prior to visit.   "

## 2024-10-14 NOTE — Patient Instructions (Addendum)
 Thank you, [name] for allowing us  to provide your care today. Today we discussed   Dermatology office to establish care especially if we ever have the question of calciphylaxis and need a biopsy: Andrea Clause, PA-C, [facility] Accepting New Patients [address] [place], [place] [address]  Phone: [phone].    Dialysis and access Dialysis is going well. Still getting the dialysis through the catheter after the RUE graft thrombosed. Currently waiting for arm to heal. Midodrine  when attending. Please monitor your blood pressure and discuss with your dialysis doctor if you need it when you're not on dialysis. This will be based on your symptoms.  It is important that you talk with the [facility] vascular surgeon about potentially needing the Eliquis  (apixaban ). Dr Magda in Dec 2025 discussed that you needed it and so it is important that the surgeon that is now leading your case clarifies this piece of information  - Please use compression stocking during the day  Pain control: - Lets continue with the 10 mg of oxycodone  every 8 hrs. It would be important for you to really evaluate the need for all the doses. I want you to think about the long term opiate use and what kind of pain we are treating in your legs, your neck and your back. AND see what other therapies can help you wait longer between doses.  My Chart Access: [email or web address]  Please follow-up in: 4-5 months when I am next in clinic     We look forward to seeing you next time. Please call our clinic at [phone] if you have any questions or concerns. The best time to call is Monday-Friday from 9am-4pm, but there is someone available 24/7. If after hours or the weekend, call the main hospital number and ask for the Internal Medicine Resident On-Call. If you need medication refills, please notify your pharmacy one week in advance and they will send us  a request.    Thank you for letting us  take part in your care. Wishing you the best!  Sandy Ip, MD 10/14/2024, 9:24 AM [name] Res[facility]

## 2024-10-15 ENCOUNTER — Encounter: Payer: Self-pay | Admitting: Student

## 2024-10-15 MED ORDER — OXYCODONE HCL 10 MG PO TABS: 10.0000 mg | ORAL_TABLET | Freq: Three times a day (TID) | ORAL | 0 refills | Status: AC | PRN

## 2024-10-15 NOTE — Assessment & Plan Note (Addendum)
 Multiple sites. Neck, lower back, bilateral legs. Currently managed with 10 mg oxycodone  every 8 hrs PRN, which she consistently takes around the clock. Using some adjunctive therapy. We had an honest discussion about her age, 48YO, and the concern of requiring higher doses of medication the longer she is on therapy. She continues to be independent living and independent with ADLs and iADLs despite baseline pain.  She will try to lengthen the time between doses and use topical medications when possible. Will refill medications today. PDMP reviewed.   - If patient needs procedures, she will contact the clinic to get acute on chronic pain medications if not provided by the specialist office instead doubling her chronic opiate medications - Filled 3x future scripts 1/3 to be filled on 2/9 2/3 to be filled on 3/11 3/3 to be filled on 4/10 - Trial of cyclobenzaprine  for muscle spasms, especially at night

## 2024-10-15 NOTE — Assessment & Plan Note (Addendum)
 Patient with difficulties with LUE graft over the past 6 months, now with catheter placed by [facility] vascular surgeon after thrombectomy done at [facility] in 2025. Currently ASA but not Xarelto. Not volume overloaded. Takes midodrine  on HD days, last day yesterday. No current plans for transplant. 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 Patient reports that this resolved after resuming binder therapy and she did not follow up with Dermatology. She has contact info and will establish again for preparedness should the rash recur and biopsy is needed

## 2024-10-15 NOTE — Assessment & Plan Note (Signed)
 Chronic. Asymptomatic. 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  - Will continue to monitor

## 2024-10-16 NOTE — Progress Notes (Signed)
 Internal Medicine Clinic Attending  Case discussed with the resident at the time of the visit.  We reviewed the resident's history and exam and pertinent patient test results.  I agree with the assessment, diagnosis, and plan of care documented in the resident's note.

## 2024-10-22 ENCOUNTER — Encounter: Payer: Self-pay | Admitting: Nephrology

## 2025-02-10 ENCOUNTER — Ambulatory Visit: Payer: Self-pay | Admitting: Student
